# Patient Record
Sex: Male | Born: 1945 | Race: White | Hispanic: No | Smoking: Current every day smoker
Health system: Southern US, Community
[De-identification: ages and names within clinical notes are randomized; demographics above are authoritative.]

## PROBLEM LIST (undated history)

## (undated) DIAGNOSIS — I2510 Atherosclerotic heart disease of native coronary artery without angina pectoris: Secondary | ICD-10-CM

## (undated) DIAGNOSIS — Z9289 Personal history of other medical treatment: Secondary | ICD-10-CM

## (undated) DIAGNOSIS — I4891 Unspecified atrial fibrillation: Secondary | ICD-10-CM

## (undated) DIAGNOSIS — F329 Major depressive disorder, single episode, unspecified: Secondary | ICD-10-CM

## (undated) DIAGNOSIS — J449 Chronic obstructive pulmonary disease, unspecified: Secondary | ICD-10-CM

## (undated) DIAGNOSIS — I10 Essential (primary) hypertension: Secondary | ICD-10-CM

## (undated) DIAGNOSIS — E119 Type 2 diabetes mellitus without complications: Secondary | ICD-10-CM

## (undated) DIAGNOSIS — N189 Chronic kidney disease, unspecified: Secondary | ICD-10-CM

## (undated) DIAGNOSIS — F32A Depression, unspecified: Secondary | ICD-10-CM

## (undated) DIAGNOSIS — I739 Peripheral vascular disease, unspecified: Secondary | ICD-10-CM

## (undated) DIAGNOSIS — K219 Gastro-esophageal reflux disease without esophagitis: Secondary | ICD-10-CM

## (undated) DIAGNOSIS — I639 Cerebral infarction, unspecified: Secondary | ICD-10-CM

## (undated) DIAGNOSIS — Z8719 Personal history of other diseases of the digestive system: Secondary | ICD-10-CM

## (undated) DIAGNOSIS — T8131XA Disruption of external operation (surgical) wound, not elsewhere classified, initial encounter: Secondary | ICD-10-CM

## (undated) DIAGNOSIS — D649 Anemia, unspecified: Secondary | ICD-10-CM

## (undated) DIAGNOSIS — E039 Hypothyroidism, unspecified: Secondary | ICD-10-CM

## (undated) DIAGNOSIS — E785 Hyperlipidemia, unspecified: Secondary | ICD-10-CM

## (undated) HISTORY — DX: Essential (primary) hypertension: I10

## (undated) HISTORY — DX: Anemia, unspecified: D64.9

## (undated) HISTORY — PX: DEBRIDEMENT AND CLOSURE WOUND: SHX5614

## (undated) HISTORY — DX: Disruption of external operation (surgical) wound, not elsewhere classified, initial encounter: T81.31XA

## (undated) HISTORY — DX: Hyperlipidemia, unspecified: E78.5

## (undated) HISTORY — DX: Chronic kidney disease, unspecified: N18.9

## (undated) HISTORY — DX: Peripheral vascular disease, unspecified: I73.9

## (undated) HISTORY — DX: Unspecified atrial fibrillation: I48.91

## (undated) HISTORY — DX: Hypothyroidism, unspecified: E03.9

## (undated) HISTORY — DX: Personal history of other diseases of the digestive system: Z87.19

## (undated) HISTORY — DX: Chronic obstructive pulmonary disease, unspecified: J44.9

## (undated) HISTORY — DX: Atherosclerotic heart disease of native coronary artery without angina pectoris: I25.10

---

## 2000-10-27 HISTORY — PX: CORONARY ARTERY BYPASS GRAFT: SHX141

## 2001-08-27 HISTORY — PX: CARDIAC CATHETERIZATION: SHX172

## 2001-09-07 ENCOUNTER — Encounter: Payer: Self-pay | Admitting: Vascular Surgery

## 2001-09-07 ENCOUNTER — Ambulatory Visit: Admission: RE | Admit: 2001-09-07 | Discharge: 2001-09-07 | Payer: Self-pay | Admitting: Vascular Surgery

## 2001-09-09 ENCOUNTER — Ambulatory Visit (HOSPITAL_COMMUNITY): Admission: RE | Admit: 2001-09-09 | Discharge: 2001-09-09 | Payer: Self-pay | Admitting: Vascular Surgery

## 2001-09-14 ENCOUNTER — Inpatient Hospital Stay (HOSPITAL_COMMUNITY): Admission: AD | Admit: 2001-09-14 | Discharge: 2001-09-22 | Payer: Self-pay | Admitting: Cardiology

## 2001-09-14 ENCOUNTER — Encounter: Payer: Self-pay | Admitting: Cardiology

## 2001-09-15 ENCOUNTER — Encounter: Payer: Self-pay | Admitting: Cardiothoracic Surgery

## 2001-09-16 ENCOUNTER — Encounter: Payer: Self-pay | Admitting: Cardiothoracic Surgery

## 2001-09-17 ENCOUNTER — Encounter: Payer: Self-pay | Admitting: Cardiothoracic Surgery

## 2001-09-18 ENCOUNTER — Encounter: Payer: Self-pay | Admitting: Cardiothoracic Surgery

## 2001-09-19 ENCOUNTER — Encounter: Payer: Self-pay | Admitting: Cardiothoracic Surgery

## 2001-11-29 ENCOUNTER — Encounter: Payer: Self-pay | Admitting: Vascular Surgery

## 2001-12-01 ENCOUNTER — Encounter: Payer: Self-pay | Admitting: Vascular Surgery

## 2001-12-01 ENCOUNTER — Inpatient Hospital Stay (HOSPITAL_COMMUNITY): Admission: RE | Admit: 2001-12-01 | Discharge: 2001-12-03 | Payer: Self-pay | Admitting: Vascular Surgery

## 2001-12-01 HISTORY — PX: FEMORAL BYPASS: SHX50

## 2002-01-17 ENCOUNTER — Encounter: Payer: Self-pay | Admitting: Vascular Surgery

## 2002-01-17 ENCOUNTER — Inpatient Hospital Stay (HOSPITAL_COMMUNITY): Admission: RE | Admit: 2002-01-17 | Discharge: 2002-01-19 | Payer: Self-pay | Admitting: Vascular Surgery

## 2002-01-17 HISTORY — PX: DRAINAGE AND CLOSURE OF LYMPHOCELE: SHX5800

## 2002-12-27 ENCOUNTER — Encounter: Payer: Self-pay | Admitting: Vascular Surgery

## 2002-12-28 ENCOUNTER — Ambulatory Visit (HOSPITAL_COMMUNITY): Admission: RE | Admit: 2002-12-28 | Discharge: 2002-12-28 | Payer: Self-pay | Admitting: Vascular Surgery

## 2002-12-29 ENCOUNTER — Encounter: Payer: Self-pay | Admitting: Vascular Surgery

## 2002-12-29 ENCOUNTER — Inpatient Hospital Stay (HOSPITAL_COMMUNITY): Admission: RE | Admit: 2002-12-29 | Discharge: 2002-12-31 | Payer: Self-pay | Admitting: Vascular Surgery

## 2002-12-29 HISTORY — PX: FEMORAL BYPASS: SHX50

## 2003-01-21 ENCOUNTER — Inpatient Hospital Stay (HOSPITAL_COMMUNITY): Admission: EM | Admit: 2003-01-21 | Discharge: 2003-02-03 | Payer: Self-pay | Admitting: Emergency Medicine

## 2003-01-21 ENCOUNTER — Encounter: Payer: Self-pay | Admitting: Vascular Surgery

## 2003-01-21 HISTORY — PX: INCISION AND DRAINAGE OF WOUND: SHX1803

## 2003-02-01 HISTORY — PX: DEBRIDEMENT AND CLOSURE WOUND: SHX5614

## 2003-05-24 ENCOUNTER — Encounter: Admission: RE | Admit: 2003-05-24 | Discharge: 2003-05-24 | Payer: Self-pay | Admitting: Vascular Surgery

## 2003-05-24 ENCOUNTER — Encounter: Payer: Self-pay | Admitting: Vascular Surgery

## 2003-07-05 ENCOUNTER — Inpatient Hospital Stay (HOSPITAL_COMMUNITY): Admission: AD | Admit: 2003-07-05 | Discharge: 2003-07-07 | Payer: Self-pay | Admitting: Vascular Surgery

## 2003-07-05 ENCOUNTER — Encounter: Payer: Self-pay | Admitting: Vascular Surgery

## 2003-07-06 ENCOUNTER — Encounter: Payer: Self-pay | Admitting: Vascular Surgery

## 2003-07-12 ENCOUNTER — Encounter: Payer: Self-pay | Admitting: Vascular Surgery

## 2003-07-12 ENCOUNTER — Inpatient Hospital Stay (HOSPITAL_COMMUNITY): Admission: RE | Admit: 2003-07-12 | Discharge: 2003-07-19 | Payer: Self-pay | Admitting: Vascular Surgery

## 2003-07-12 ENCOUNTER — Encounter (INDEPENDENT_AMBULATORY_CARE_PROVIDER_SITE_OTHER): Payer: Self-pay | Admitting: Specialist

## 2003-07-12 HISTORY — PX: FEMORAL ENDARTERECTOMY: SUR606

## 2004-04-16 ENCOUNTER — Ambulatory Visit (HOSPITAL_COMMUNITY): Admission: RE | Admit: 2004-04-16 | Discharge: 2004-04-16 | Payer: Self-pay | Admitting: Vascular Surgery

## 2005-01-24 ENCOUNTER — Inpatient Hospital Stay (HOSPITAL_COMMUNITY): Admission: AD | Admit: 2005-01-24 | Discharge: 2005-01-31 | Payer: Self-pay | Admitting: Vascular Surgery

## 2005-01-29 HISTORY — PX: FEMOROPOPLITEAL THROMBECTOMY / EMBOLECTOMY: SUR432

## 2005-03-05 ENCOUNTER — Inpatient Hospital Stay (HOSPITAL_COMMUNITY): Admission: AD | Admit: 2005-03-05 | Discharge: 2005-03-10 | Payer: Self-pay | Admitting: Sports Medicine

## 2005-03-14 ENCOUNTER — Inpatient Hospital Stay (HOSPITAL_COMMUNITY): Admission: AD | Admit: 2005-03-14 | Discharge: 2005-03-20 | Payer: Self-pay | Admitting: Vascular Surgery

## 2005-03-17 ENCOUNTER — Encounter (INDEPENDENT_AMBULATORY_CARE_PROVIDER_SITE_OTHER): Payer: Self-pay | Admitting: *Deleted

## 2005-08-25 ENCOUNTER — Encounter: Admission: RE | Admit: 2005-08-25 | Discharge: 2005-08-25 | Payer: Self-pay | Admitting: Vascular Surgery

## 2005-09-03 ENCOUNTER — Inpatient Hospital Stay (HOSPITAL_COMMUNITY): Admission: RE | Admit: 2005-09-03 | Discharge: 2005-09-09 | Payer: Self-pay | Admitting: Vascular Surgery

## 2005-09-09 HISTORY — PX: FEMORAL BYPASS: SHX50

## 2006-02-06 ENCOUNTER — Inpatient Hospital Stay (HOSPITAL_COMMUNITY): Admission: AD | Admit: 2006-02-06 | Discharge: 2006-03-03 | Payer: Self-pay | Admitting: Vascular Surgery

## 2006-02-06 HISTORY — PX: AXILLARY-FEMORAL BYPASS GRAFT: SHX894

## 2006-02-06 HISTORY — PX: FEMOROPOPLITEAL THROMBECTOMY / EMBOLECTOMY: SUR432

## 2006-02-07 ENCOUNTER — Encounter: Payer: Self-pay | Admitting: Vascular Surgery

## 2006-02-07 ENCOUNTER — Encounter (INDEPENDENT_AMBULATORY_CARE_PROVIDER_SITE_OTHER): Payer: Self-pay | Admitting: *Deleted

## 2006-02-08 HISTORY — PX: THROMBECTOMY / EMBOLECTOMY AXILLARY ARTERY: SUR1352

## 2006-02-09 ENCOUNTER — Encounter: Payer: Self-pay | Admitting: Vascular Surgery

## 2006-02-12 HISTORY — PX: DEBRIDEMENT  FOOT: SUR387

## 2006-02-23 ENCOUNTER — Encounter (INDEPENDENT_AMBULATORY_CARE_PROVIDER_SITE_OTHER): Payer: Self-pay | Admitting: *Deleted

## 2006-02-23 HISTORY — PX: BELOW KNEE LEG AMPUTATION: SUR23

## 2006-02-24 ENCOUNTER — Ambulatory Visit: Payer: Self-pay | Admitting: Physical Medicine & Rehabilitation

## 2006-03-20 ENCOUNTER — Ambulatory Visit (HOSPITAL_COMMUNITY): Admission: RE | Admit: 2006-03-20 | Discharge: 2006-03-20 | Payer: Self-pay | Admitting: Vascular Surgery

## 2006-03-27 ENCOUNTER — Inpatient Hospital Stay (HOSPITAL_COMMUNITY): Admission: AD | Admit: 2006-03-27 | Discharge: 2006-04-23 | Payer: Self-pay | Admitting: Vascular Surgery

## 2006-03-30 HISTORY — PX: WOUND DEBRIDEMENT: SHX247

## 2006-03-31 ENCOUNTER — Ambulatory Visit: Payer: Self-pay | Admitting: Physical Medicine & Rehabilitation

## 2006-04-26 ENCOUNTER — Ambulatory Visit (HOSPITAL_COMMUNITY): Admission: EM | Admit: 2006-04-26 | Discharge: 2006-04-26 | Payer: Self-pay | Admitting: Pediatrics

## 2006-09-02 ENCOUNTER — Encounter: Admission: RE | Admit: 2006-09-02 | Discharge: 2006-12-01 | Payer: Self-pay | Admitting: Vascular Surgery

## 2007-01-07 ENCOUNTER — Ambulatory Visit: Payer: Self-pay | Admitting: *Deleted

## 2007-06-09 ENCOUNTER — Ambulatory Visit: Payer: Self-pay | Admitting: Vascular Surgery

## 2007-07-07 ENCOUNTER — Ambulatory Visit: Payer: Self-pay | Admitting: Vascular Surgery

## 2007-08-04 ENCOUNTER — Ambulatory Visit: Payer: Self-pay | Admitting: Vascular Surgery

## 2007-11-10 ENCOUNTER — Ambulatory Visit: Payer: Self-pay | Admitting: Vascular Surgery

## 2007-12-15 ENCOUNTER — Ambulatory Visit: Payer: Self-pay | Admitting: Vascular Surgery

## 2007-12-29 ENCOUNTER — Ambulatory Visit: Payer: Self-pay | Admitting: Vascular Surgery

## 2008-01-05 ENCOUNTER — Encounter: Admission: RE | Admit: 2008-01-05 | Discharge: 2008-01-05 | Payer: Self-pay | Admitting: Vascular Surgery

## 2008-01-05 ENCOUNTER — Ambulatory Visit: Payer: Self-pay | Admitting: Vascular Surgery

## 2008-01-14 ENCOUNTER — Ambulatory Visit: Payer: Self-pay | Admitting: Vascular Surgery

## 2008-01-14 ENCOUNTER — Ambulatory Visit (HOSPITAL_COMMUNITY): Admission: RE | Admit: 2008-01-14 | Discharge: 2008-01-14 | Payer: Self-pay | Admitting: Vascular Surgery

## 2008-04-19 ENCOUNTER — Ambulatory Visit: Payer: Self-pay | Admitting: Vascular Surgery

## 2008-07-26 ENCOUNTER — Ambulatory Visit: Payer: Self-pay | Admitting: Vascular Surgery

## 2008-11-08 ENCOUNTER — Ambulatory Visit: Payer: Self-pay | Admitting: Vascular Surgery

## 2008-11-23 ENCOUNTER — Ambulatory Visit (HOSPITAL_COMMUNITY): Admission: RE | Admit: 2008-11-23 | Discharge: 2008-11-23 | Payer: Self-pay | Admitting: Endocrinology

## 2009-05-30 ENCOUNTER — Inpatient Hospital Stay (HOSPITAL_COMMUNITY): Admission: AD | Admit: 2009-05-30 | Discharge: 2009-06-09 | Payer: Self-pay | Admitting: Pulmonary Disease

## 2009-05-30 ENCOUNTER — Encounter: Payer: Self-pay | Admitting: Emergency Medicine

## 2009-05-30 ENCOUNTER — Ambulatory Visit: Payer: Self-pay | Admitting: Pulmonary Disease

## 2009-05-31 ENCOUNTER — Ambulatory Visit: Payer: Self-pay | Admitting: Vascular Surgery

## 2009-05-31 ENCOUNTER — Ambulatory Visit: Payer: Self-pay | Admitting: Internal Medicine

## 2009-05-31 ENCOUNTER — Encounter: Payer: Self-pay | Admitting: Internal Medicine

## 2009-06-04 ENCOUNTER — Encounter: Payer: Self-pay | Admitting: Internal Medicine

## 2009-06-05 ENCOUNTER — Encounter: Payer: Self-pay | Admitting: Surgery

## 2009-06-05 HISTORY — PX: ABOVE KNEE LEG AMPUTATION: SUR20

## 2009-06-08 ENCOUNTER — Encounter: Payer: Self-pay | Admitting: Infectious Diseases

## 2009-06-25 ENCOUNTER — Ambulatory Visit: Payer: Self-pay | Admitting: Surgery

## 2009-07-09 ENCOUNTER — Ambulatory Visit: Payer: Self-pay | Admitting: Surgery

## 2009-10-31 ENCOUNTER — Ambulatory Visit: Payer: Self-pay | Admitting: Vascular Surgery

## 2009-10-31 ENCOUNTER — Encounter: Payer: Self-pay | Admitting: Infectious Diseases

## 2009-10-31 ENCOUNTER — Encounter: Admission: RE | Admit: 2009-10-31 | Discharge: 2009-10-31 | Payer: Self-pay | Admitting: Vascular Surgery

## 2009-11-14 ENCOUNTER — Encounter: Payer: Self-pay | Admitting: Infectious Diseases

## 2009-11-21 ENCOUNTER — Ambulatory Visit: Payer: Self-pay | Admitting: Vascular Surgery

## 2009-11-21 DIAGNOSIS — M8618 Other acute osteomyelitis, other site: Secondary | ICD-10-CM

## 2009-11-26 ENCOUNTER — Encounter (INDEPENDENT_AMBULATORY_CARE_PROVIDER_SITE_OTHER): Payer: Self-pay | Admitting: *Deleted

## 2009-11-26 DIAGNOSIS — I10 Essential (primary) hypertension: Secondary | ICD-10-CM | POA: Insufficient documentation

## 2009-11-26 DIAGNOSIS — I2510 Atherosclerotic heart disease of native coronary artery without angina pectoris: Secondary | ICD-10-CM | POA: Insufficient documentation

## 2009-11-26 DIAGNOSIS — E119 Type 2 diabetes mellitus without complications: Secondary | ICD-10-CM

## 2009-11-27 ENCOUNTER — Ambulatory Visit: Payer: Self-pay | Admitting: Infectious Diseases

## 2009-11-27 LAB — CONVERTED CEMR LAB
BUN: 72 mg/dL — ABNORMAL HIGH (ref 6–23)
Calcium: 9.1 mg/dL (ref 8.4–10.5)
Creatinine, Ser: 2.18 mg/dL — ABNORMAL HIGH (ref 0.40–1.50)
Glucose, Bld: 307 mg/dL — ABNORMAL HIGH (ref 70–99)
Sodium: 136 meq/L (ref 135–145)

## 2009-11-30 ENCOUNTER — Encounter: Payer: Self-pay | Admitting: Infectious Diseases

## 2009-12-04 ENCOUNTER — Telehealth: Payer: Self-pay

## 2009-12-10 ENCOUNTER — Ambulatory Visit: Payer: Self-pay | Admitting: Infectious Diseases

## 2009-12-10 LAB — CONVERTED CEMR LAB
Basophils Absolute: 0 10*3/uL (ref 0.0–0.1)
Basophils Relative: 0 % (ref 0–1)
CRP: 0.9 mg/dL — ABNORMAL HIGH (ref <0.6)
Eosinophils Absolute: 0.2 10*3/uL (ref 0.0–0.7)
Eosinophils Relative: 2 % (ref 0–5)
Hemoglobin: 14.3 g/dL (ref 13.0–17.0)
Lymphs Abs: 2.5 10*3/uL (ref 0.7–4.0)
MCV: 79.7 fL (ref >=78.0)
Monocytes Absolute: 0.9 10*3/uL (ref 0.1–1.0)
Neutrophils Relative %: 64 % (ref 43–77)
RBC: 5.41 M/uL (ref 4.22–5.81)
Sed Rate: 25 mm/hr — ABNORMAL HIGH (ref 0–16)

## 2010-04-05 ENCOUNTER — Ambulatory Visit: Payer: Self-pay | Admitting: Vascular Surgery

## 2010-04-11 ENCOUNTER — Inpatient Hospital Stay (HOSPITAL_COMMUNITY): Admission: RE | Admit: 2010-04-11 | Discharge: 2010-04-11 | Payer: Self-pay | Admitting: Vascular Surgery

## 2010-04-11 HISTORY — PX: LEG AMPUTATION ABOVE KNEE: SHX117

## 2010-05-15 ENCOUNTER — Ambulatory Visit: Payer: Self-pay | Admitting: Vascular Surgery

## 2010-06-13 ENCOUNTER — Ambulatory Visit: Payer: Self-pay | Admitting: Vascular Surgery

## 2010-06-27 ENCOUNTER — Ambulatory Visit: Payer: Self-pay | Admitting: Vascular Surgery

## 2010-07-03 IMAGING — CR DG CHEST 1V PORT
1 series · 1 of 1 positions shown · non-contrast
Comparison: Chest radiograph performed 01/14/2008.

CLINICAL DATA: Intermittent shortness of breath for 1 day;
weakness.  History of smoking.

PORTABLE CHEST - 1 VIEW

[view not recorded]
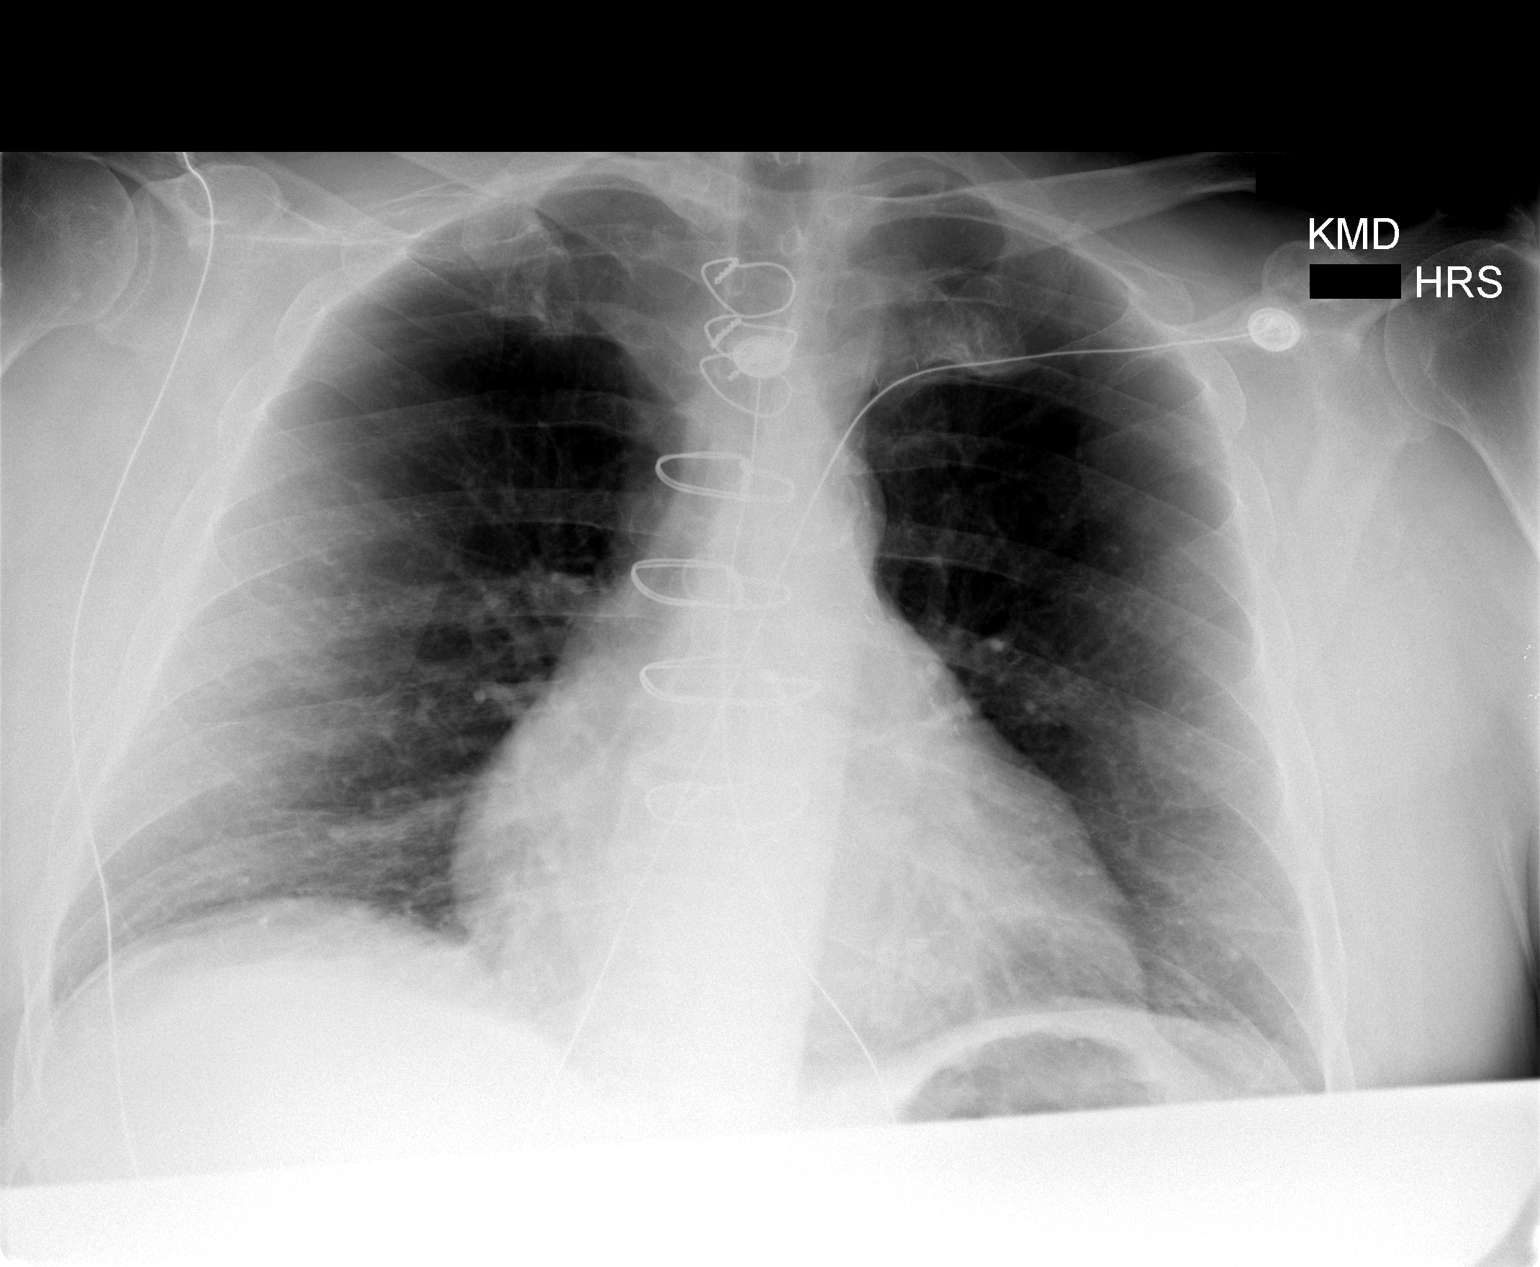

[1 of 1 positions shown; findings below may reference images not displayed]

FINDINGS: The lungs are mildly hypoexpanded; minimal bibasilar
atelectasis is noted.  No additional focal consolidation is seen.
There is no evidence of pleural effusion or pneumothorax, although
the left costophrenic angle is incompletely imaged on this study.

The cardiomediastinal silhouette is mildly prominent, but appears
generally stable from the prior study, given technique.  The
patient is status post median sternotomy.  Post-operative changes
relating to prior CABG are noted.  Calcification is seen within the
aortic arch.  No acute osseous abnormalities are identified.
IMPRESSION: 1.  Mildly hypoexpanded lungs with minimal bibasilar atelectasis.
2.  Mild stable cardiomegaly.

## 2010-07-08 IMAGING — CR DG CHEST 1V PORT
1 series · 1 of 1 positions shown · non-contrast
Comparison: 06/01/2009

CLINICAL DATA: Septic shock

PORTABLE CHEST - 1 VIEW

[view not recorded]
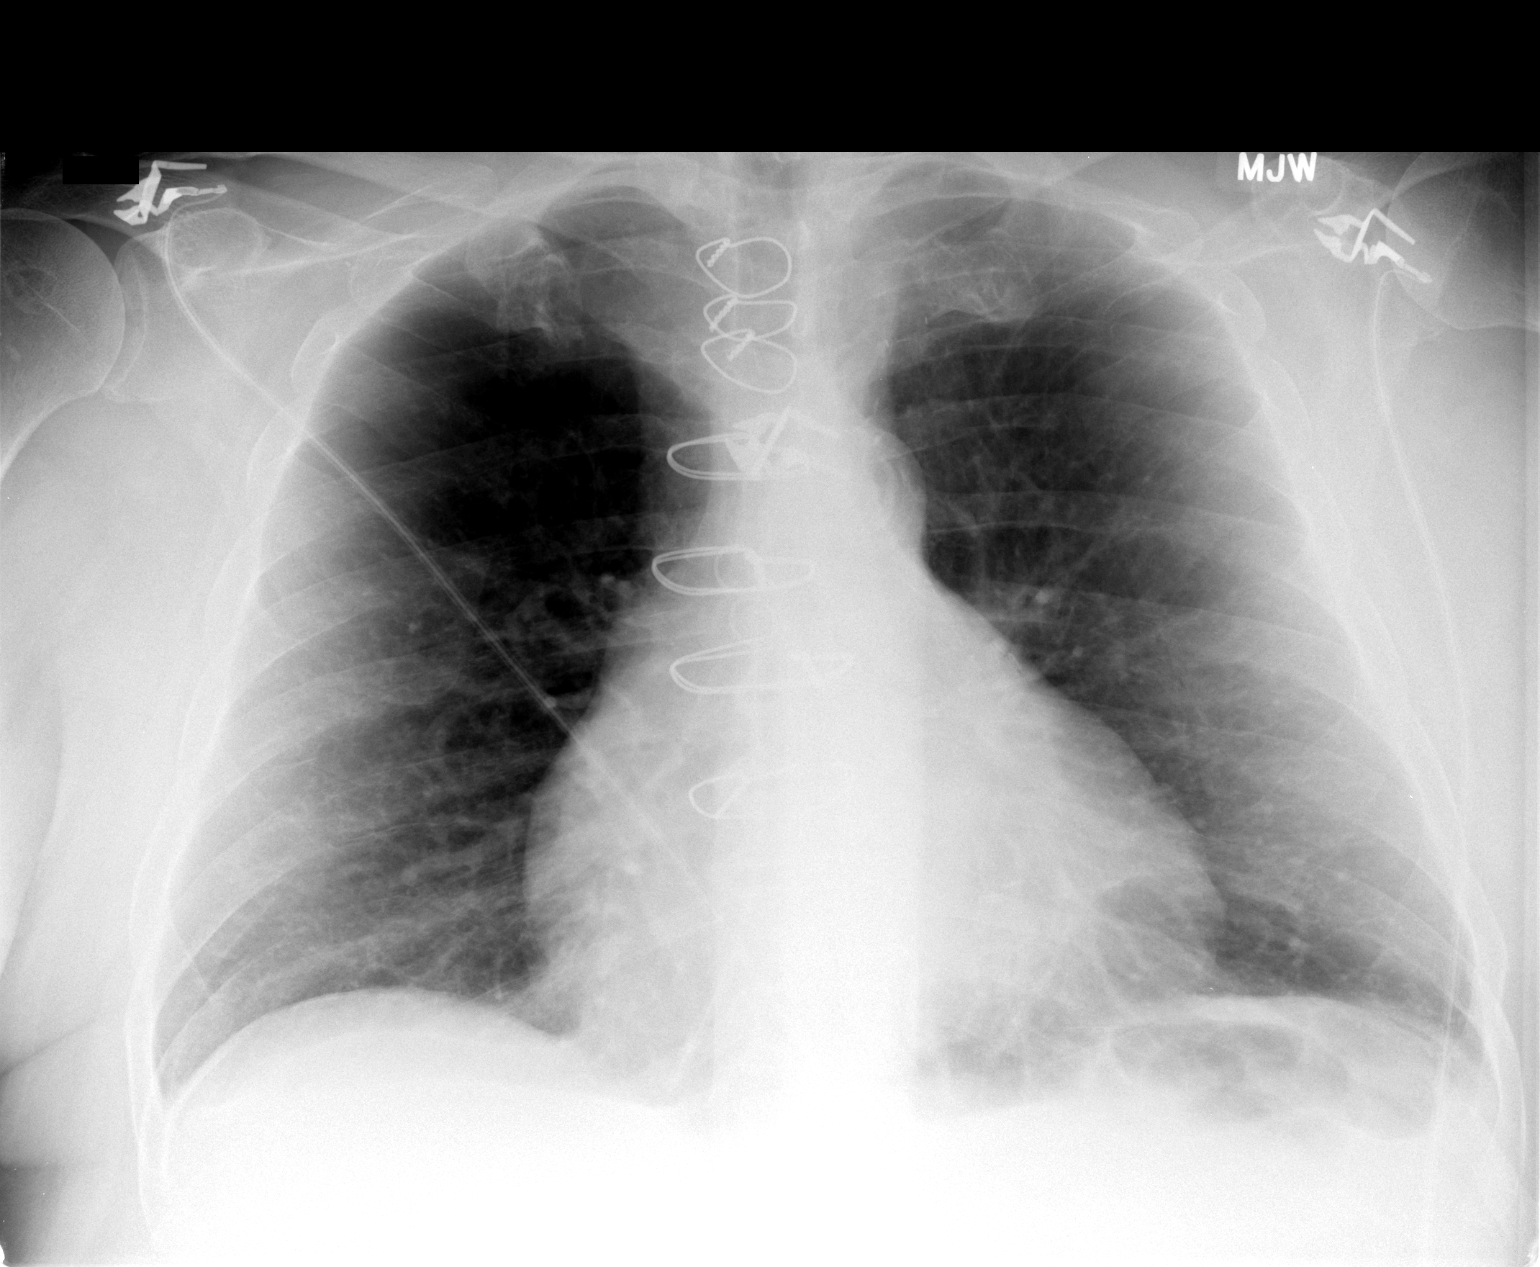

[1 of 1 positions shown; findings below may reference images not displayed]

FINDINGS: Interval improvement in aeration of the right lower lobe.
In one-view, the lungs appear clear. No pleural fluid. Heart size
upper normal considering AP projection.  Prior CABG.
IMPRESSION: Right lower lobe density resolved - currently the lungs are clear
in one-view.

## 2010-07-11 ENCOUNTER — Ambulatory Visit: Payer: Self-pay | Admitting: Vascular Surgery

## 2010-11-07 ENCOUNTER — Ambulatory Visit: Admit: 2010-11-07 | Payer: Self-pay | Admitting: Surgery

## 2010-11-17 ENCOUNTER — Encounter: Payer: Self-pay | Admitting: Vascular Surgery

## 2010-11-26 NOTE — Medication Information (Signed)
Summary: Medicare Plans: RX  Medicare Plans: RX   Imported By: Florinda Marker 12/27/2009 16:51:35  _____________________________________________________________________  External Attachment:    Type:   Image     Comment:   External Document

## 2010-11-26 NOTE — Miscellaneous (Signed)
Summary: Problems, Medications and Allergies  Clinical Lists Changes  Problems: Added new problem of ACUTE OSTEOMYELITIS OTHER SPECIFIED SITE (ICD-730.08) - right femur Added new problem of CAD (ICD-414.00) Added new problem of DM (ICD-250.00) Added new problem of HYPERLIPIDEMIA (ICD-272.4) Added new problem of HYPERTENSION (ICD-401.9) Medications: Added new medication of JANUVIA 100 MG TABS (SITAGLIPTIN PHOSPHATE) Take 1 tablet by mouth once a day per PCP Added new medication of * VITAMIN D3 UNIT CAPS (CHOLECALCIFEROL) Take 1 capsule by mouth once a day per PCP Added new medication of ATENOLOL 25 MG TABS (ATENOLOL) Take 1 tablet by mouth at bedtime per PCP Added new medication of LISINOPRIL-HYDROCHLOROTHIAZIDE 20-12.5 MG TABS (LISINOPRIL-HYDROCHLOROTHIAZIDE) Take 1 tablet by mouth once a day per PCP Added new medication of SIMVASTATIN 40 MG TABS (SIMVASTATIN) Take 1 tablet by mouth once a day per PCP Added new medication of LANTUS 100 UNIT/ML SOLN (INSULIN GLARGINE) Inject subcutaneously 50 units at bedtime per PCP Added new medication of GLIMEPIRIDE 2 MG TABS (GLIMEPIRIDE) Take 1 tablet by mouth once a day in the AM per PCP Added new medication of GLIMEPIRIDE 4 MG TABS (GLIMEPIRIDE) Take 1 tablet by mouth once a day in the AM per PCP Added new medication of CARDIZEM LA 180 MG XR24H-TAB (DILTIAZEM HCL COATED BEADS) Take 1 tablet by mouth once a day per PCP Added new medication of OMEPRAZOLE 40 MG CPDR (OMEPRAZOLE) Take 1 capsule by mouth once a day per PCP Observations: Added new observation of NKA: T (11/26/2009 9:43)

## 2010-11-26 NOTE — Progress Notes (Signed)
Summary: needs bloodwork  Phone Note Other Incoming   Summary of Call: [name] needs to get and esr and crp done given that he never got them done from Dr Darrick Penna office.  Please ask him to come for BW at his convenience in next 1 wk Initial call taken by: Clydie Braun MD,  December 04, 2009 12:03 PM  Follow-up for Phone Call        Pt informed and will come on Monday 12-10-09. Tomasita Morrow RN  December 07, 2009 3:12 PM

## 2010-11-26 NOTE — Miscellaneous (Signed)
Summary: HIPAA Restrictions  HIPAA Restrictions   Imported By: Florinda Marker 11/27/2009 15:50:01  _____________________________________________________________________  External Attachment:    Type:   Image     Comment:   External Document

## 2010-11-26 NOTE — Consult Note (Signed)
Summary: [facility]  [facility]   Imported By: Florinda Marker 12/06/2009 13:38:51  _____________________________________________________________________  External Attachment:    Type:   Image     Comment:   External Document

## 2010-11-26 NOTE — Assessment & Plan Note (Signed)
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  Has history of prior MSSA infxn of L leg wound and mixed cx reults from his R stump infn in 2007.  Since his AKA he has had diff wiht pain at stump when wearing prosthesis.    During prior BKA infxn he had received 8 wks of IV vanco. Has been on doxycyline suppressive therapy for suppression for over a year.   r aka 8/10    Current Allergies: No known allergies  Past History:  Past Medical History: 1. Peripheral vascular disease.   2. Atrial fibrillation.   3. Coronary artery disease, status post bypass.   4. Diabetes mellitus type 2.   5. Hypothyroidism.   6. Hyperlipidemia.   7. Hypertension.   8. History of acute on chronic renal disease.    Past Surgical History: 1. Left femoral bypass/revision in 2003/2006.   2. Right BKA 2007.  - complicated by ulcer 3. R AKA 8/10  4. Coronary artery bypass graft.   Vital Signs:  Patient profile:   65 year old male Height:      71 inches Weight:      182.0 pounds Temp:     96.7 degrees F oral  Physical Exam  Additional Exam:  March 27 2009 r stump cx  FEW WBC PRESENT, PREDOMINANTLY PMN                                ABUNDANT                                GRAM                                NEGATIVE RODS                                FEW GRAM POSITIVE COCCI                                IN PAIRS                              CULTURE:                      MULTIPLE ORGANISMS PRESENT, NONE PREDOMINANT                                Note:                                NO STAPHYLOCOCCUS AUREUS ISOLATED                                NO GROUP A STREP (S.PYOGENES) ISOLATED  xray jan 5  Findings:   There are small foci of cortical loss and   demineralization of the distal right femoral stump  worrisome for   osteomyelitis.  The bone is diffusely osteopenic. Considerable  arterial calcification is noted.    IMPRESSION:   There are foci worrisome for osteomyelitis involving the stump of   the distal right femur status post AKA.    Impression & Recommendations:  Problem # 1:  ACUTE OSTEOMYELITIS OTHER SPECIFIED SITE (ICD-730.08) I am not convinced that he has osteo at the stump but could be.  Will check esr and cbc. check mri to eval for osteo If positive will likely need 6 wks therapy  Orders: T-Basic Metabolic Panel [id number]) MRI with & without Contrast (MRI w&w/o Contrast)  Problem # 2:  DM (ICD-250.00)  His updated medication list for this problem includes:    Januvia 100 Mg Tabs (Sitagliptin phosphate) .[name]... Take 1 tablet by mouth once a day per pcp    Lisinopril-hydrochlorothiazide 20-12.5 Mg Tabs (Lisinopril-hydrochlorothiazide) .[name]... Take 1 tablet by mouth once a day per pcp    Lantus 100 Unit/ml Soln (Insulin glargine) ..... Inject subcutaneously 50 units at bedtime per pcp    Glimepiride 2 Mg Tabs (Glimepiride) .[name]... Take 1 tablet by mouth once a day in the am per pcp    Glimepiride 4 Mg Tabs (Glimepiride) .[name]... Take 1 tablet by mouth once a day in the am per pcp  Patient Instructions: 1)  Will check MRI - we will call you to schedule. 2)  Please schedule a follow-up appointment in 1 month. Process Orders Check Orders Results:     [facility]: Check successful Tests Sent for requisitioning (December 03, 2009 9:33 PM):     11/27/2009: [facility] -- T-Basic Metabolic Panel [phone] (signed)

## 2010-11-26 NOTE — Consult Note (Signed)
Summary: Office Visit Notes  Office Visit Notes   Imported By: Florinda Marker 12/06/2009 13:41:12  _____________________________________________________________________  External Attachment:    Type:   Image     Comment:   External Document

## 2011-01-12 LAB — POCT I-STAT 4, (NA,K, GLUC, HGB,HCT)
Glucose, Bld: 210 mg/dL — ABNORMAL HIGH (ref 70–99)
Hemoglobin: 17.3 g/dL — ABNORMAL HIGH (ref 13.0–17.0)
Potassium: 4.3 mEq/L (ref 3.5–5.1)
Sodium: 137 mEq/L (ref 135–145)

## 2011-01-12 LAB — GLUCOSE, CAPILLARY
Glucose-Capillary: 226 mg/dL — ABNORMAL HIGH (ref 70–99)
Glucose-Capillary: 284 mg/dL — ABNORMAL HIGH (ref 70–99)

## 2011-01-12 LAB — PROTIME-INR
INR: 1.03 (ref 0.00–1.49)
Prothrombin Time: 13.4 seconds (ref 11.6–15.2)

## 2011-01-12 LAB — APTT: aPTT: 28 seconds (ref 24–37)

## 2011-02-01 LAB — BASIC METABOLIC PANEL
BUN: 11 mg/dL (ref 6–23)
BUN: 23 mg/dL (ref 6–23)
CO2: 19 mEq/L (ref 19–32)
CO2: 19 mEq/L (ref 19–32)
CO2: 19 mEq/L (ref 19–32)
CO2: 20 mEq/L (ref 19–32)
CO2: 24 mEq/L (ref 19–32)
Calcium: 7.4 mg/dL — ABNORMAL LOW (ref 8.4–10.5)
Calcium: 7.6 mg/dL — ABNORMAL LOW (ref 8.4–10.5)
Calcium: 9.7 mg/dL (ref 8.4–10.5)
Chloride: 107 mEq/L (ref 96–112)
Chloride: 108 mEq/L (ref 96–112)
Chloride: 109 mEq/L (ref 96–112)
Chloride: 111 mEq/L (ref 96–112)
Chloride: 99 mEq/L (ref 96–112)
Creatinine, Ser: 1.27 mg/dL (ref 0.4–1.5)
Creatinine, Ser: 1.39 mg/dL (ref 0.4–1.5)
Creatinine, Ser: 1.77 mg/dL — ABNORMAL HIGH (ref 0.4–1.5)
Creatinine, Ser: 3.68 mg/dL — ABNORMAL HIGH (ref 0.4–1.5)
GFR calc Af Amer: 14 mL/min — ABNORMAL LOW (ref >60)
GFR calc Af Amer: 20 mL/min — ABNORMAL LOW (ref >60)
GFR calc Af Amer: 47 mL/min — ABNORMAL LOW (ref >60)
GFR calc Af Amer: 50 mL/min — ABNORMAL LOW (ref >60)
GFR calc Af Amer: >60 mL/min (ref >60)
GFR calc Af Amer: >60 mL/min (ref >60)
GFR calc non Af Amer: 33 mL/min — ABNORMAL LOW (ref >60)
GFR calc non Af Amer: 57 mL/min — ABNORMAL LOW (ref >60)
Glucose, Bld: 123 mg/dL — ABNORMAL HIGH (ref 70–99)
Potassium: 3.9 mEq/L (ref 3.5–5.1)
Potassium: 3.9 mEq/L (ref 3.5–5.1)
Potassium: 4 mEq/L (ref 3.5–5.1)
Potassium: 4.3 mEq/L (ref 3.5–5.1)
Sodium: 135 mEq/L (ref 135–145)
Sodium: 136 mEq/L (ref 135–145)
Sodium: 137 mEq/L (ref 135–145)
Sodium: 137 mEq/L (ref 135–145)
Sodium: 139 mEq/L (ref 135–145)
Sodium: 141 mEq/L (ref 135–145)

## 2011-02-01 LAB — CBC
HCT: 25.7 % — ABNORMAL LOW (ref 39.0–52.0)
HCT: 26.4 % — ABNORMAL LOW (ref 39.0–52.0)
HCT: 28.5 % — ABNORMAL LOW (ref 39.0–52.0)
HCT: 29.5 % — ABNORMAL LOW (ref 39.0–52.0)
Hemoglobin: 10.1 g/dL — ABNORMAL LOW (ref 13.0–17.0)
Hemoglobin: 8.4 g/dL — ABNORMAL LOW (ref 13.0–17.0)
Hemoglobin: 8.8 g/dL — ABNORMAL LOW (ref 13.0–17.0)
Hemoglobin: 8.8 g/dL — ABNORMAL LOW (ref 13.0–17.0)
Hemoglobin: 8.9 g/dL — ABNORMAL LOW (ref 13.0–17.0)
Hemoglobin: 9.6 g/dL — ABNORMAL LOW (ref 13.0–17.0)
MCHC: 32.8 g/dL (ref 30.0–36.0)
MCHC: 33 g/dL (ref 30.0–36.0)
MCHC: 33 g/dL (ref 30.0–36.0)
MCHC: 33.8 g/dL (ref 30.0–36.0)
MCHC: 34 g/dL (ref 30.0–36.0)
MCV: 81.2 fL (ref 78.0–100.0)
MCV: 81.8 fL (ref 78.0–100.0)
MCV: 81.8 fL (ref 78.0–100.0)
MCV: 82.7 fL (ref 78.0–100.0)
MCV: 82.9 fL (ref 78.0–100.0)
Platelets: 170 10*3/uL (ref 150–400)
Platelets: 171 10*3/uL (ref 150–400)
Platelets: 174 10*3/uL (ref 150–400)
Platelets: 235 10*3/uL (ref 150–400)
Platelets: 250 10*3/uL (ref 150–400)
Platelets: 259 10*3/uL (ref 150–400)
Platelets: 277 10*3/uL (ref 150–400)
RBC: 3.06 MIL/uL — ABNORMAL LOW (ref 4.22–5.81)
RBC: 3.15 MIL/uL — ABNORMAL LOW (ref 4.22–5.81)
RBC: 3.16 MIL/uL — ABNORMAL LOW (ref 4.22–5.81)
RBC: 3.26 MIL/uL — ABNORMAL LOW (ref 4.22–5.81)
RBC: 3.44 MIL/uL — ABNORMAL LOW (ref 4.22–5.81)
RBC: 3.7 MIL/uL — ABNORMAL LOW (ref 4.22–5.81)
RDW: 15.9 % — ABNORMAL HIGH (ref 11.5–15.5)
RDW: 15.9 % — ABNORMAL HIGH (ref 11.5–15.5)
RDW: 16.2 % — ABNORMAL HIGH (ref 11.5–15.5)
RDW: 16.3 % — ABNORMAL HIGH (ref 11.5–15.5)
RDW: 16.5 % — ABNORMAL HIGH (ref 11.5–15.5)
WBC: 10 10*3/uL (ref 4.0–10.5)
WBC: 10.1 10*3/uL (ref 4.0–10.5)
WBC: 11.7 10*3/uL — ABNORMAL HIGH (ref 4.0–10.5)
WBC: 11.7 10*3/uL — ABNORMAL HIGH (ref 4.0–10.5)
WBC: 20.3 10*3/uL — ABNORMAL HIGH (ref 4.0–10.5)
WBC: 9.4 10*3/uL (ref 4.0–10.5)
WBC: 9.8 10*3/uL (ref 4.0–10.5)

## 2011-02-01 LAB — GLUCOSE, CAPILLARY
Glucose-Capillary: 121 mg/dL — ABNORMAL HIGH (ref 70–99)
Glucose-Capillary: 121 mg/dL — ABNORMAL HIGH (ref 70–99)
Glucose-Capillary: 123 mg/dL — ABNORMAL HIGH (ref 70–99)
Glucose-Capillary: 123 mg/dL — ABNORMAL HIGH (ref 70–99)
Glucose-Capillary: 128 mg/dL — ABNORMAL HIGH (ref 70–99)
Glucose-Capillary: 140 mg/dL — ABNORMAL HIGH (ref 70–99)
Glucose-Capillary: 142 mg/dL — ABNORMAL HIGH (ref 70–99)
Glucose-Capillary: 143 mg/dL — ABNORMAL HIGH (ref 70–99)
Glucose-Capillary: 144 mg/dL — ABNORMAL HIGH (ref 70–99)
Glucose-Capillary: 151 mg/dL — ABNORMAL HIGH (ref 70–99)
Glucose-Capillary: 154 mg/dL — ABNORMAL HIGH (ref 70–99)
Glucose-Capillary: 165 mg/dL — ABNORMAL HIGH (ref 70–99)
Glucose-Capillary: 168 mg/dL — ABNORMAL HIGH (ref 70–99)
Glucose-Capillary: 170 mg/dL — ABNORMAL HIGH (ref 70–99)
Glucose-Capillary: 170 mg/dL — ABNORMAL HIGH (ref 70–99)
Glucose-Capillary: 175 mg/dL — ABNORMAL HIGH (ref 70–99)
Glucose-Capillary: 175 mg/dL — ABNORMAL HIGH (ref 70–99)
Glucose-Capillary: 188 mg/dL — ABNORMAL HIGH (ref 70–99)
Glucose-Capillary: 189 mg/dL — ABNORMAL HIGH (ref 70–99)
Glucose-Capillary: 192 mg/dL — ABNORMAL HIGH (ref 70–99)
Glucose-Capillary: 201 mg/dL — ABNORMAL HIGH (ref 70–99)
Glucose-Capillary: 230 mg/dL — ABNORMAL HIGH (ref 70–99)
Glucose-Capillary: 242 mg/dL — ABNORMAL HIGH (ref 70–99)
Glucose-Capillary: 334 mg/dL — ABNORMAL HIGH (ref 70–99)
Glucose-Capillary: 76 mg/dL (ref 70–99)
Glucose-Capillary: 83 mg/dL (ref 70–99)
Glucose-Capillary: 86 mg/dL (ref 70–99)
Glucose-Capillary: 90 mg/dL (ref 70–99)

## 2011-02-01 LAB — COMPREHENSIVE METABOLIC PANEL
ALT: 18 U/L (ref 0–53)
ALT: 19 U/L (ref 0–53)
AST: 25 U/L (ref 0–37)
AST: 29 U/L (ref 0–37)
Albumin: 2.5 g/dL — ABNORMAL LOW (ref 3.5–5.2)
Albumin: 3.1 g/dL — ABNORMAL LOW (ref 3.5–5.2)
Alkaline Phosphatase: 28 U/L — ABNORMAL LOW (ref 39–117)
Alkaline Phosphatase: 34 U/L — ABNORMAL LOW (ref 39–117)
BUN: 124 mg/dL — ABNORMAL HIGH (ref 6–23)
Calcium: 9.4 mg/dL (ref 8.4–10.5)
Chloride: 110 mEq/L (ref 96–112)
Chloride: 92 mEq/L — ABNORMAL LOW (ref 96–112)
GFR calc Af Amer: 13 mL/min — ABNORMAL LOW (ref >60)
Glucose, Bld: 162 mg/dL — ABNORMAL HIGH (ref 70–99)
Potassium: 3.7 mEq/L (ref 3.5–5.1)
Potassium: 5 mEq/L (ref 3.5–5.1)
Sodium: 133 mEq/L — ABNORMAL LOW (ref 135–145)
Sodium: 138 mEq/L (ref 135–145)
Total Bilirubin: 1 mg/dL (ref 0.3–1.2)
Total Bilirubin: 1.2 mg/dL (ref 0.3–1.2)
Total Protein: 5.3 g/dL — ABNORMAL LOW (ref 6.0–8.3)
Total Protein: 6 g/dL (ref 6.0–8.3)

## 2011-02-01 LAB — DIGOXIN LEVEL: Digoxin Level: 0.3 ng/mL — ABNORMAL LOW (ref 0.8–2.0)

## 2011-02-01 LAB — POCT I-STAT, CHEM 8
Calcium, Ion: 1.37 mmol/L — ABNORMAL HIGH (ref 1.12–1.32)
HCT: 30 % — ABNORMAL LOW (ref 39.0–52.0)
TCO2: 28 mmol/L (ref 0–100)

## 2011-02-01 LAB — RENAL FUNCTION PANEL
Calcium: 8.8 mg/dL (ref 8.4–10.5)
GFR calc Af Amer: 30 mL/min — ABNORMAL LOW (ref >60)
GFR calc non Af Amer: 25 mL/min — ABNORMAL LOW (ref >60)
Phosphorus: 2.5 mg/dL (ref 2.3–4.6)
Sodium: 137 mEq/L (ref 135–145)

## 2011-02-01 LAB — CROSSMATCH
ABO/RH(D): A POS
Antibody Screen: NEGATIVE

## 2011-02-01 LAB — URINE MICROSCOPIC-ADD ON

## 2011-02-01 LAB — PREPARE FRESH FROZEN PLASMA

## 2011-02-01 LAB — DIFFERENTIAL
Basophils Absolute: 0 10*3/uL (ref 0.0–0.1)
Basophils Absolute: 0 10*3/uL (ref 0.0–0.1)
Basophils Relative: 0 % (ref 0–1)
Basophils Relative: 0 % (ref 0–1)
Eosinophils Absolute: 0 10*3/uL (ref 0.0–0.7)
Eosinophils Relative: 0 % (ref 0–5)
Eosinophils Relative: 0 % (ref 0–5)
Lymphocytes Relative: 10 % — ABNORMAL LOW (ref 12–46)
Monocytes Absolute: 0.8 10*3/uL (ref 0.1–1.0)
Neutro Abs: 14.2 10*3/uL — ABNORMAL HIGH (ref 1.7–7.7)
Neutro Abs: 17.8 10*3/uL — ABNORMAL HIGH (ref 1.7–7.7)

## 2011-02-01 LAB — HEPATIC FUNCTION PANEL
Albumin: 2.4 g/dL — ABNORMAL LOW (ref 3.5–5.2)
Alkaline Phosphatase: 33 U/L — ABNORMAL LOW (ref 39–117)
Total Protein: 5.6 g/dL — ABNORMAL LOW (ref 6.0–8.3)

## 2011-02-01 LAB — POCT I-STAT 3, ART BLOOD GAS (G3+)
Acid-base deficit: 3 mmol/L — ABNORMAL HIGH (ref 0.0–2.0)
Bicarbonate: 20.6 mEq/L (ref 20.0–24.0)
Patient temperature: 97.6
TCO2: 22 mmol/L (ref 0–100)

## 2011-02-01 LAB — URINALYSIS, ROUTINE W REFLEX MICROSCOPIC
Leukocytes, UA: NEGATIVE
Nitrite: NEGATIVE
Specific Gravity, Urine: 1.014 (ref 1.005–1.030)
Urobilinogen, UA: 0.2 mg/dL (ref 0.0–1.0)

## 2011-02-01 LAB — HEMOCCULT GUIAC POC 1CARD (OFFICE): Fecal Occult Bld: POSITIVE

## 2011-02-01 LAB — POCT CARDIAC MARKERS
CKMB, poc: <1 ng/mL — ABNORMAL LOW (ref 1.0–8.0)
Myoglobin, poc: 461 ng/mL (ref 12–200)
Troponin i, poc: <0.05 ng/mL (ref 0.00–0.09)

## 2011-02-01 LAB — URINE CULTURE
Colony Count: NO GROWTH
Colony Count: NO GROWTH

## 2011-02-01 LAB — CULTURE, BLOOD (ROUTINE X 2)
Culture: NO GROWTH
Culture: NO GROWTH

## 2011-02-01 LAB — URINALYSIS, MICROSCOPIC ONLY
Glucose, UA: NEGATIVE mg/dL
Specific Gravity, Urine: 1.013 (ref 1.005–1.030)
pH: 7.5 (ref 5.0–8.0)

## 2011-02-01 LAB — HEMOGLOBIN AND HEMATOCRIT, BLOOD: HCT: 21.4 % — ABNORMAL LOW (ref 39.0–52.0)

## 2011-02-01 LAB — PHOSPHORUS
Phosphorus: 2.1 mg/dL — ABNORMAL LOW (ref 2.3–4.6)
Phosphorus: 3.1 mg/dL (ref 2.3–4.6)

## 2011-02-01 LAB — ABO/RH: ABO/RH(D): A POS

## 2011-02-01 LAB — PROTIME-INR: Prothrombin Time: 18.9 seconds — ABNORMAL HIGH (ref 11.6–15.2)

## 2011-02-01 LAB — CK TOTAL AND CKMB (NOT AT ARMC): Relative Index: 1.2 (ref 0.0–2.5)

## 2011-02-01 LAB — CORTISOL: Cortisol, Plasma: 17.1 ug/dL

## 2011-02-01 LAB — HEMOGLOBIN A1C
Hgb A1c MFr Bld: 7.7 % — ABNORMAL HIGH (ref 4.6–6.1)
Mean Plasma Glucose: 174 mg/dL

## 2011-02-01 LAB — LACTIC ACID, PLASMA: Lactic Acid, Venous: 2.1 mmol/L (ref 0.5–2.2)

## 2011-02-01 LAB — D-DIMER, QUANTITATIVE: D-Dimer, Quant: 0.22 ug/mL-FEU (ref 0.00–0.48)

## 2011-02-01 LAB — MRSA PCR SCREENING: MRSA by PCR: NEGATIVE

## 2011-02-06 ENCOUNTER — Telehealth: Payer: Self-pay | Admitting: Cardiology

## 2011-02-06 NOTE — Telephone Encounter (Signed)
PT IS NOT DOING WELL, HE IS NOT "ACTING RIGHT" "CANNOT REMEMBER"

## 2011-02-06 NOTE — Telephone Encounter (Addendum)
Pt's sister Thomasenia Sales called, pt is c/o headache, not able to remember, and not acting right.  RN advised Thomasenia Sales to take pt to ER to be evaluated.  Pt's is refusing to go to ER.  RN advised Thomasenia Sales to call Dr. Rinaldo Cloud office and request to be seen ASAP.  Thomasenia Sales will call Dr. Rinaldo Cloud office and get in to be seen today ASAP.

## 2011-03-11 NOTE — Assessment & Plan Note (Signed)
OFFICE VISIT   [name], [name]  DOB:  [birth year]                                       11/21/2009  EAVWU#:[id number]   The patient returns for follow-up today.  He was last seen on October 31, 2009.  He previously underwent a right above-knee amputation in August  2010.  Since that time he has had some difficulty wearing his prosthetic  due to pain at the end of the stump.  He does not really have pain  except when he is wearing the prosthetic.  He has also noticed a  crunching type sounding in the end of it.  Unfortunately he continues to  smoke one pack of cigarettes per day.   He has had no real significant change since the last office visit.  We  did obtain an x-ray of his right knee which showed some osteopenia and  possible concern for osteomyelitis in the distal end of his femur.   On exam today temperature is 97.8.  We are unable to measure his blood  pressure after three attempts and he has known subclavian occlusive  disease, heart rate is 82 and regular.  Chest:  Clear to auscultation.  Heart:  Exam is regular rate and rhythm.  Right lower extremity shows a  well-healed above knee amputation.  There is no drainage.  There is no  erythema.  He does have crepitus at the tip of the stump.   I discussed with the patient today the options of a revision of his  above-knee amputation and trimming back the femur or trying a course of  antibiotic therapy to see if this fixed the problem first.  He has opted  to try conservative management with antibiotic therapy.  In light of  this we have scheduled him to see Dr. Sampson Goon with infectious disease  to consider antibiotic treatment of this.  We have also ordered a sed  rate and a CBC at Dr. Mr. Jarrett Ables request today.  The patient will  follow up with me after his treatment course for what is presumably  osteomyelitis of his right AKA stump.  He will also continue to follow  up in our graft  surveillance protocol for his left femoral below knee  popliteal bypass.     [name]. Fields, MD  Electronically Signed   CEF/MEDQ  D:  11/21/2009  T:  11/21/2009  Job:  [id number]   cc:   Mick Sell, MD  Dr. Evlyn Kanner

## 2011-03-11 NOTE — Procedures (Signed)
BYPASS GRAFT EVALUATION   INDICATION:  Follow up left leg bypass graft.   HISTORY:  Diabetes:  Yes, on insulin.  Cardiac:  CABG in November 2002.  Hypertension:  Yes.  Smoking:  Two packs per day for 40+ years.  Previous Surgery:  Left femoral-to-popliteal artery bypass graft with  PTFE and saphenous vein on 09/04/2005 by Dr. Darrick Penna.  Right below-knee amputation on 02/23/2006 by Dr. Darrick Penna.   SINGLE LEVEL ARTERIAL EXAM                               RIGHT              LEFT  Brachial:  Anterior tibial:  Posterior tibial:  Peroneal:  Ankle/brachial index:   PREVIOUS ABI:  Date:  RIGHT:  Below-knee amputation.  LEFT:  Calcified.   LOWER EXTREMITY BYPASS GRAFT DUPLEX EXAM:   DUPLEX:  Doppler arterial waveforms are biphasic proximal to the graft  and to the mid graft.  Distal graft and native artery are brisk and  monophasic.   IMPRESSION:  Patent left femoral-to-popliteal artery by pass graft.  Status post right below-knee amputation.  Left ABI not calculated due to medial calcification.   ___________________________________________  [name] Fields, MD   DP/MEDQ  D:  06/09/2007  T:  06/10/2007  Job:  [id number]

## 2011-03-11 NOTE — Assessment & Plan Note (Signed)
OFFICE VISIT   FARREL, GUIMOND  DOB:  12-20-1945                                       10/31/2009  [id number]#:[id number]   The patient returns for followup today.  He previously underwent a right  above knee amputation by Dr. Myra Gianotti in August of 2010.  He states that  since that time he has had difficulty wearing his prosthetic as he has  pain on the end of his distal stump.  He has no pain on the stump when  he is not wearing the prosthetic.  He has no complaints in his left  lower extremity and says he is doing well from this.  Unfortunately he  continues to smoke about one pack of cigarettes per day.   He denies any chest pain or shortness of breath.  He has not had any  skin breakdown or ulcerations.   PHYSICAL EXAM:  Blood pressure is 132/75 in the left arm, heart rate 76  and regular.  Temperature is 98.4.  Lower extremities, he has a 1+ right  femoral pulse.  He has a 2+ left femoral pulse.  He has absent pedal and  popliteal pulses in the left leg.  He has no ulcerations in the left  foot.  He has no ulcerations on his above knee amputation.  He does have  some crepitus on palpation anteriorly and I am wondering whether or not  it may be a loose body in his above knee amputation.  Otherwise this is  well-healed.   He had a graft duplex exam today which showed a patent left femoral to  popliteal bypass graft with some increased velocity proximal to the  graft but overall this looked good.   CHRONIC MEDICAL PROBLEMS:  Continue to include coronary artery disease,  diabetes, hyperlipidemia, hypertension, all of which are currently  controlled.   Overall the patient appears to be doing well.  He does have some  problems with pain on wearing his prosthetic on the right leg.  We will  obtain an x-ray today to make sure that he has no loose body or fracture  of his distal femur.  If this has no obvious reason for pain then we  will refer him back to the  prosthetist to try to increase the padding in  his prosthetic to see if he can walk on this.  He will continue to  follow up in our graft surveillance protocol.   Addendum: some irregularity of aka femur cortex will schedule for follow  up appointment to discuss findings 11/21/2009     [name]. Fields, MD  Electronically Signed   CEF/MEDQ  D:  10/31/2009  T:  11/01/2009  Job:  [id number]

## 2011-03-11 NOTE — Assessment & Plan Note (Signed)
OFFICE VISIT   [name], [name]  DOB:  08-Sep-1946                                       04/05/2010  ZHYQM#:[id number]   The patient presents today for evaluation of eschar on his right AKA.  He reports this has been present for several weeks and become somewhat  larger over this time.  He was seen by Dr. Darrick Penna most recently in  January 2011 and was having trouble with healing the above-knee  amputation on the right at that time.  This was initially done in August  2010.  It had been suggested that he have debridement of this area, and  he refused at that time.  He is having more drainage now.   PAST MEDICAL HISTORY:  Well-documented in his old chart, with a long  smoking history, long history peripheral vascular occlusive disease,  history of prior coronary bypass grafting, insulin-dependent diabetes,  COPD, hypothyroidism, dyslipidemia, hiatal hernia, hypertension and  peripheral neuropathy, chronic atrial fibrillation.   PHYSICAL EXAMINATION:  He is a well-developed, well-nourished white male  appearing stated age.  His amputation site has an eschar of  approximately 2 cm that is through the entire thickness of his skin into  the subcutaneous fat.  There is no evidence of subcutaneous abscess.   I discussed this with the patient and his son.  I have recommended that  he undergo debridement and reclosure of this in the operating room.  We  have scheduled this at [facility] on 06/16 with Dr. Darrick Penna.     Larina Earthly, M.D.  Electronically Signed   TFE/MEDQ  D:  04/05/2010  T:  04/08/2010  Job:  [id number]   cc:   Jeannett Senior A. Evlyn Kanner, M.D.  [name]. Darrick Penna, MD

## 2011-03-11 NOTE — Assessment & Plan Note (Signed)
OFFICE VISIT   ZAI, CHMIEL  DOB:  [birth year]                                       06/09/2007  ZOXWR#:[id number]   Mr. [name] returns to for follow up today for development of an  ulceration on his right below knee amputation.  He previously has had a  right axillary femoral bypass graft which is occluded on this side.  He  has also had multiple femoral popliteal bypass grafts on the right side.  He states the ulcer has been there for approximately three months.  He  also mentions his stump is fairly cool.  He has had no significant  drainage from the ulcer.  He says it has been healing over the last few  weeks.   He had a graft duplex today which showed some mild increase in velocity  proximal to his left fem/pop which is not unchanged.  Remainder of the  bypass is widely patent.  He has palpable pulse in his left foot.  His  right below knee amputation stump is cool from the knee down.  There is  a 1 x 1 cm ulceration in the central aspect of this with some pale-  appearing granulation tissue.   Most likely he has ischemia of his right below knee amputation, and he  had some difficulty healing this up initially.  The circulation to the  right below knee amputation is not very good.  I believe we should  manage this conservatively at first with wound care.  If it does not  heal with local wound care, we will schedule him for an arteriogram with  consideration of angioplasty and stenting on the right side to try to  improve perfusion of this stump.  I would not entertain a new bypass  procedure as the morbidity of this is not worth the benefit.  I  discussed this with Mr. Ong today.   [name]. Fields, MD  Electronically Signed   CEF/MEDQ  D:  06/09/2007  T:  06/11/2007  Job:  [id number]

## 2011-03-11 NOTE — Assessment & Plan Note (Signed)
OFFICE VISIT   CORDERO, SURETTE  DOB:  October 05, 1946                                       08/04/2007  ZOXWR#:[id number]   Mr. Hester returns today for followup of a 1 cm ulceration on his right  below-the-knee-amputation stump.  The stump itself looks good today.  The ulcer is healing somewhat.  The stump is much less dusky.  I believe  since he has some evidence of healing, we should continue to follow this  for now.  He will see me again in one months' time.  Hopefully the wound  will heal spontaneously.  He is wearing his prosthetic some, but thinks  there is a slightly loose fit.  He is going to see the prosthetist  regarding this.   [name]. Fields, MD  Electronically Signed   CEF/MEDQ  D:  08/04/2007  T:  08/05/2007  Job:  [id number]

## 2011-03-11 NOTE — Assessment & Plan Note (Signed)
OFFICE VISIT   [name], [name]  DOB:  Sep 26, 1946                                       07/11/2010  WUJWJ#:[id number]   The patient returns today for followup of his right above knee  amputation revision.  He denies any fever or chills.  He has minimal  drainage from the wound.  He still has a 1 cm x 1 cm open sinus tract in  the central portion of his right above knee amputation.  There is pink  granulation tissue at the base of this.   He will continue to do wet-to-dry dressings on this.  Hopefully he will  be able to quit smoking.  This probably would assist in wound healing.  The perfusion to his right stump is not fantastic and probably this is  impeding wound healing as well.  However, I believe that a revision of  his above knee amputation would be even more difficult to heal.  If the  wound deteriorates will consider revision of the above knee amputation.  Otherwise he will follow up in four months' time and we will get a graft  duplex of his left lower extremity at that time.     [name]. Fields, MD  Electronically Signed   CEF/MEDQ  D:  07/11/2010  T:  07/12/2010  Job:  [id number]

## 2011-03-11 NOTE — Procedures (Signed)
BYPASS GRAFT EVALUATION   INDICATION:  Follow up left femoropopliteal bypass graft.   HISTORY:  Diabetes:  Yes.  Cardiac:  CABG in 2002.  Hypertension:  Yes.  Smoking:  Yes.  Previous Surgery:  Left femoral-to-popliteal bypass graft 09/04/2005 by  Dr. Darrick Penna, right below-the-knee amputation 02/23/2006 by Dr. Darrick Penna.   SINGLE LEVEL ARTERIAL EXAM                               RIGHT              LEFT  Brachial:  Anterior tibial:  Posterior tibial:  Peroneal:  Ankle/brachial index:   PREVIOUS ABI:  Date:  RIGHT:  LEFT:   LOWER EXTREMITY BYPASS GRAFT DUPLEX EXAM:   DUPLEX:  Patent left femoral popliteal bypass graft noted with,  however,elevated velocities noted at proximal anastomosis.   IMPRESSION:  1. Unable to obtain to bilateral ankle-brachial indices due to      calcified vessels.  2. Patent left femoral popliteal bypass graft with elevated velocities      at proximal anastomosis.    ___________________________________________  [name] Fields, MD   CB/MEDQ  D:  10/31/2009  T:  10/31/2009  Job:  [id number]

## 2011-03-11 NOTE — Op Note (Signed)
NAMENOBUO, NUNZIATA               ACCOUNT NO.:  [id number]   MEDICAL RECORD NO.:  [id number]          PATIENT TYPE:  INP   LOCATION:  5524                         FACILITY:  [facility]   PHYSICIAN:  Juleen China IV, MDDATE OF BIRTH:  09/10/1946   DATE OF PROCEDURE:  06/05/2009  DATE OF DISCHARGE:                               OPERATIVE REPORT   PREOPERATIVE DIAGNOSIS:  Right below-knee amputation ulcer.   POSTOPERATIVE DIAGNOSIS:  Right below-knee amputation ulcer.   PROCEDURE PERFORMED:  Right above-knee amputation.   ANESTHESIA:  General.   SURGEON:  1. Charlena Cross, MD   ASSISTANT:  Wilmon Arms, PA   COMPLICATIONS:  None.   DRESSINGS:  Ace wrap and Kerlix.   SPECIMENS:  Right leg.   INDICATIONS:  This is a 65 year old gentleman who has previously  undergone right below-knee amputation.  He has developed an ulcer at his  patella with underlying osteomyelitis.  He has had surrounding erythema.  He comes in today for conversion to an above-knee amputation.   PROCEDURE:  The patient was identified in the holding area and taken to  room #6.  He was placed supine on the table.  General endotracheal  anesthesia was administered.  The patient was prepped and draped in a  standard sterile fashion.  A time-out was called.  Antibiotics were  given.  A fishmouth incision was made with #10 blade.  Cautery was used  to divide the subcutaneous tissue.  The muscle was divided with Bovie  cautery.  The femur was circumferentially exposed.  Periosteal elevator  was used to elevate periosteum.  A Gigli saw was then used to transect  the femur just proximal to the patella.  The anterior surface was  beveled.  Next, the neurovascular bundle was dissected out.  The nerve  was ligated proximal to the cut edge of the femur with a 2-0 silk tie  and then transected.  The artery and vein were ligated with a 2-0 silk  suture ligature and a 0 silk tie.  The remaining portion of the  muscle  was then transected with Bovie cautery.  The wound was then copiously  irrigated.  The hemostasis was  achieved using cautery.  Next, the fascia was reapproximated with  interrupted figure-of-eight 2-0 Vicryl suture.  The skin was closed with  staples.  Sterile dressings were applied.  The patient tolerated the  procedure well and was taken to the recovery room in stable condition.  There were no complications.      Jorge Ny, MD  Electronically Signed     VWB/MEDQ  D:  06/05/2009  T:  06/05/2009  Job:  [id number]

## 2011-03-11 NOTE — Assessment & Plan Note (Signed)
OFFICE VISIT   GAY, RAPE  DOB:  [birth year]                                       06/27/2010  ACZYS#:[id number]   The patient returns for follow-up today.  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  He still has had some  difficulty healing at the central aspect of this.  He was last seen on  August 18th.  He states there is minimal drainage at this point, but he  does not feel that the wound has shrunk much.  Unfortunately he  continues to smoke.  I discussed with him today smoking cessation as a  possible means to assist in his wound healing.  He did not really  express any interest in this.   PHYSICAL EXAMINATION:  Today, blood pressure 136/59 in the right arm,  heart rate 76 and regular.  Temperature is 98.  Right AKA is well-  healed, except for the central portion where there is a 1 cm x 1 cm open  wound.  There is a large amount of fibrinous debris, and this was  debrided in the office today.  There was good bleeding tissue below  this.  The size of the cavity is essentially the same as it was in the  middle of August.  However, there is no further mucopurulent drainage.   He will continue local wound care twice a day.  He will follow up with  me in 2 weeks' time.     [name]. Fields, MD  Electronically Signed   CEF/MEDQ  D:  06/27/2010  T:  06/28/2010  Job:  [id number]

## 2011-03-11 NOTE — Assessment & Plan Note (Signed)
OFFICE VISIT   [name], [name]  DOB:  1946-04-27                                       06/13/2010  EAVWU#:[id number]   The patient returns for followup today.  He underwent revision of his  right above knee amputation on June 16.  He presents today with some  drainage from the central aspect of this.  He denies any fever or  chills.  He is currently not on antibiotics.   PHYSICAL EXAM:  Blood pressure is 143/84 in the left arm, heart rate is  76 and regular.  Temperature is 97.5.  Right above knee amputation is  well-healed except for the very central aspect.  There is a small 1 cm x  1 cm x 1 cm cavity in this area with a small amount of clear /  mucopurulent drainage.  There were also two retained sutures and these  were removed today.  The wound has been packed with normal saline  moistened gauze.  Hopefully this will continue to heal over the next few  weeks.  He will return for followup in 2 weeks.  We will set up home  health wound care for b.i.d. dressing changes.     [name]. Fields, MD  Electronically Signed   CEF/MEDQ  D:  06/13/2010  T:  06/14/2010  Job:  [id number]

## 2011-03-11 NOTE — Consult Note (Signed)
[name], [name]               ACCOUNT NO.:  [id number]   MEDICAL RECORD NO.:  [id number]          PATIENT TYPE:  INP   LOCATION:  2112                         FACILITY:  [facility]   PHYSICIAN:  Aram Beecham B. Eliott Nine, M.D.DATE OF BIRTH:  Mar 28, 1946   DATE OF CONSULTATION:  05/31/2009  DATE OF DISCHARGE:                                 CONSULTATION   REASON FOR CONSULTATION:  Acute on chronic kidney disease.   PRIMARY CARE PHYSICIAN:  Jeannett Senior A. Evlyn Kanner, M.D.   VASCULAR SURGEON:  [name]. Fields, MD   CHIEF COMPLAINT:  Weakness, GI bleeding, right BKA wound.   HISTORY OF PRESENT ILLNESS:  This is a 65 year old male with diabetes  mellitus who came in significant peripheral vascular disease who  presented to the emergency department due to weakness, coffee ground  emesis x1 day and was found to be hypotensive.  The patient had a  chronic right BKA wound that was not healing and the patient was  refusing further surgery per vascular surgery office notes.  On  admission he was on pressors briefly for about an hour and responded  well to fluid hydration.  He was started on vancomycin and Zosyn  empirically for his wounds.  BUN and creatinine was 125/6.29 on  admission which has decreased to BUN 112 and creatinine 5.12 today with  good urine output.  The patient's potassium and acid base status is  fine.  The patient's last creatinine in Dr. Rinaldo Cloud office on April 2010  was 2.3.  On further chart review it appears that the patient has a  history of multiple renal arteries with questionable bilateral renal  artery stenosis per a CT angiogram on August 25, 2005.  Subsequent  renal ultrasound on Feb 25, 2006 shows normal left and right kidneys.  Repeat CT angiogram on January 05, 2008, shows at least two accessory  right renal arteries.  Bilateral main renal arteries are noted to be  patent in this study.   PAST MEDICAL HISTORY:  1. Peripheral vascular disease.  2. Atrial fibrillation.  3. Coronary artery disease, status post bypass.  4. Diabetes mellitus type 2.  5. Hypothyroidism.  6. Hyperlipidemia.  7. Hypertension.  8. History of acute on chronic renal disease.  In 2007 the patient had      Nephrology consult with Dr. Arrie Aran who called this      multifactorial ischemic ATN due to postoperative decline in blood      pressure, on ACE, diuresis.  Creatinine peaked at that time at 4.1,      no hemodialysis required.   PAST SURGICAL HISTORY:  1. Left femoral bypass/revision in 2003/2006.  2. Right BKA 2007.  3. Coronary artery bypass graft.   HOME MEDICATIONS:  The patient is unable to list home medications.  Per  medication list from Dr. Rinaldo Cloud office the patient is on:  1. Coumadin 3 mg 1-1/2 tablets daily.  2. Multivitamin.  3. Metoprolol 50 mg b.i.d.  4. Lotensin HCT 10/12.5 daily.  5. Synthroid 50 mcg daily.  6. Amaryl 4 mg 1/2 tablet daily.  7. Lopid 600 mg  b.i.d.  8. NovoLog 70/30 20 units daily.  9. BuSpar 5 mg b.i.d.  10.Digoxin 1.25 mg p.o. daily.  11.Furosemide 40 mg daily.  12.Zaroxolyn 2.5 mg every other day.  13.Crestor 10 mg 1/2 tablet daily.  14.The patient notes taking multiple Aleve per day for chronic pain.   ALLERGIES:  No known drug allergies.   FAMILY HISTORY:  Noncontributory.   SOCIAL HISTORY:  The patient lives by himself in [place], [place], but his son lives next door and assists him with his health  care.  The patient smokes 1/2 pack per day for many decades.  The  patient is not ambulatory at baseline.  He denies alcohol use or illicit  drug use.   REVIEW OF SYSTEMS:  Positive for black tarry stools, hematemesis,  weakness, negative for chest pain, shortness of breath, or dysuria,  urinary retention.   LABORATORY DATA:  1. Chest x-ray on August 4, shows bibasilar atelectasis with mild      stable cardiomegaly.  No change on x-ray the next day.  2. X-ray of the right knee shows osteomyelitis of the right  patella      and possible lateral femoral condyle.  3. CBC; white blood cell count 11.7 down from 16.8 at admission,      hemoglobin 8.8 down from 9.2, platelets 170 down from 259.  4. Basic metabolic profile; sodium 135, potassium 4.4, chloride 99,      bicarbonate 24, BUN 112, creatinine 5.12, glucose 241, calcium 9.7.      As per HPI the patient has shown continued improvement in BUN and      creatinine with IV hydration.  It appears that the patient's      baseline may be 2.3 based on outpatient creatinine April 2010.  5. MRSA PCR negative.  6. PT 1.6.  7. Digoxin level 0.3.  8. Lactic acid 1.3.  9. Fecal occult blood positive.  10.Cardiac markers negative for ischemia.  11.ABG; 4.721/31.5/44/20.6.  12.Urinalysis; specific gravity 1.014, trace blood, negative nitrites,      negative leukocytes, negative protein.  Microscopy shows 0-2 red      blood cells.   PHYSICAL EXAMINATION:  VITAL SIGNS:  Temperature afebrile, heart rate 72-  107, blood pressure now 108/41, with his blood pressure as low as 75/22.  Respiratory rate 14-19, O2 95% on room air.  Ins and outs; the patient  has had urine output of 125 mL for the past 2 hours.  Over the past 9  hours he has 1220 mL out.  GENERAL:  Alert and oriented x4 in no acute distress.  HEENT:  Neck supple.  Extraocular movements intact.  CARDIOVASCULAR:  Rhythm irregularly irregular, no murmurs.  LUNGS:  Crackles in bases, scattered wheezes.  ABDOMEN:  Positive bowel sounds, soft, no tenderness to palpation.  EXTREMITIES:  Left lower extremity warm, 1-2 good dorsalis pedis pulse,  chronic venous stasis changes.  Right lower extremity with BKA with  black 2-3 cm eschar.   ASSESSMENT/PLAN:  This is a 65 year old male with peripheral vascular  disease, right below knee amputation wound, chronic atrial fibrillation,  hematemesis admitted in septic shock.  1. Acute on chronic renal failure.  The patient has baseline chronic      kidney  disease with creatinine in 2007 at 1.6 and most recent      creatinine April 2010 at 2.3 with a question of renal artery      stenosis who presented with sepsis, on ACE inhibitor.  The      patient's renal function has been improving with blood pressure      stabilization, treatment of sepsis, IV fluid hydration.  He is      currently making urine and we are hopeful that the patient will      return to his most recent baseline creatinine of 2.3.  The patient      has stated that he does not want dialysis.  Currently the patient      has no indication for dialysis.  At this point in time, no imaging      is needed in the setting of the patient's acute illness.  If he      continues to have elevated creatinines and does not trend down to      baseline, further studies to rule out other causes of acute renal      failure should be pursued.  We will follow the patient closely.  2. Septic shock.  Likely due to right lower extremity osteomyelitis.      The patient is on vancomycin and Zosyn.  No longer requires any      pressor support.  Pancultures pending.  Plan for above knee      amputation in the future.  3. Osteomyelitis.  See problem #2.  4. Gastrointestinal bleeding.  The patient on Coumadin at therapeutic      levels.  The patient was given FFP and primary team is following      CBC.  He is currently on Protonix.  GI is consulted and plans to      take him for endoscopy today.  5. Code status.  Per primary team, the patient is a full code.  6. Hypothyroidism.  This problem was seen on previous dictations.  The      patient states he has no thyroid problems and does not take any      replacement.  The patient's home medication list shows that he is      on Synthroid and should resume home medications.      Delbert Harness, MD  Electronically Signed      Duke Salvia. Eliott Nine, M.D.  Electronically Signed    KB/MEDQ  D:  05/31/2009  T:  05/31/2009  Job:  [id number]

## 2011-03-11 NOTE — Op Note (Signed)
NAME:  TANUSH, DREES               ACCOUNT NO.:  [id number]   MEDICAL RECORD NO.:  [id number]          PATIENT TYPE:  AMB   LOCATION:  SDS                          FACILITY:  [facility]   PHYSICIAN:  Charles E. Fields, MD  DATE OF BIRTH:  1946-03-08   DATE OF PROCEDURE:  01/14/2008  DATE OF DISCHARGE:  01/14/2008                               OPERATIVE REPORT   PROCEDURE:  Aortogram with bilateral lower extremity runoff.   PREOPERATIVE DIAGNOSIS:  Nonhealing wound right below-knee amputation.   POSTOPERATIVE DIAGNOSIS:  Nonhealing wound right below-knee amputation.   ANESTHESIA:  Local.   OPERATIVE DETAILS:  After obtaining informed consent, the patient was  taken to the PV lab.  The patient was placed in the supine position on  the angio table.  The patient's entire left upper extremity was prepped  and draped in usual sterile fashion.  Local anesthesia was infiltrated  over the left brachial artery.  Ultrasound guidance was used to  selectively cannulate the left brachial artery using a micropuncture  set.  The L1-4 wire from the micropuncture set was threaded into the  distal axillary artery.  The sheath was then placed over this.  This was  then exchanged for an 035 Wholey wire.  The sheath was then removed and  a 6-French short sheath placed over the guidewire into the left brachial  artery.  The sheath was then flushed thoroughly with a mixture of 200  mcg of nitroglycerin and 2000 units of heparin.  Next, the guidewire was  advanced down into the descending thoracic aorta and down into the  abdominal aorta.  A 5-French pigtail catheter was then placed over the  guidewire down to the distal abdominal aorta.  Abdominal aortogram was  obtained.  This shows patent left and right common iliac arteries as  well as a patent infrarenal aorta with mild atherosclerotic change.  The  left internal iliac artery was diffusely diseased but does give off some  branches proximally.  The right  internal iliac artery was also  significantly diseased proximally, approximately 80% stenosis.  Proximal  external iliac artery was patent bilaterally.   Next, the pigtail catheter was moved down adjacent to the aortic  bifurcation.  The distal and proximal right external iliac artery has  several high-grade stenoses from 80-50% in diameter.  The left external  iliac artery has moderate atherosclerotic changes but no flow-limiting  stenosis.  A right lower extremity runoff view was then obtained.  This  shows filling of the right common femoral artery with occlusion of the  common femoral artery distally.  There was some delayed filling of a  very small profunda branch and a very small right superficial femoral  artery which occludes in the mid thigh.  There were abundant collaterals  around the femoral head which were the primary blood supply to the  thigh.   Next, a left lower extremity runoff view was obtained.  This showed a  patent proximal left femoral to below-knee popliteal bypass.  The  profunda femoris on the left side was patent.  The left superficial  femoral artery occluded at the level of the adductor hiatus.  The above-  knee popliteal artery reconstitute.  The below-knee popliteal artery was  moderate to severely diseased distal to the anastomosis.  There was  runoff via the peroneal artery which was heavily diseased in the left  leg.  The proximal anterior tibial artery was occluded.  The distal  anterior tibial artery did reconstitute and filled all the way to level  the foot as the dorsalis pedis artery.  There was no significant  narrowing of the femoral-popliteal bypass distally.   Next, the 5-French pigtail catheter was pulled back over a guidewire.  The 5-French sheath was then pulled after the ACT was determined to be  less than 200.  Hemostasis was obtained with direct pressure.  The  patient tolerated the procedure well, and there were no complications.  The  patient was taken to the holding area in stable condition.   OPERATIVE FINDINGS:  1. Diffusely diseased with multiple high-grade stenoses in the right      external iliac artery.  2. Occluded distal right common femoral artery, very small superficial      femoral artery on the right side which occluded in the midthigh,      small right profunda femoris artery.  3. Patent left femoral to below-knee popliteal bypass with diffusely      diseased distal below-knee popliteal artery and runoff primarily      via the peroneal artery in the left leg, a short segment occlusion      of the proximal anterior tibial artery with runoff via the dorsalis      pedis artery and distal anterior tibial artery, occlusion of the      posterior tibial artery on the left.      [name]. Fields, MD  Electronically Signed     CEF/MEDQ  D:  01/17/2008  T:  01/18/2008  Job:  [id number]

## 2011-03-11 NOTE — Procedures (Signed)
BYPASS GRAFT EVALUATION   INDICATION:  Followup, left fem-pop bypass graft.   HISTORY:  Diabetes:  Yes.  Cardiac:  CABG in 2002.  Hypertension:  Yes.  Smoking:  Yes.  Previous Surgery:  Please see above.   SINGLE LEVEL ARTERIAL EXAM                               RIGHT              LEFT  Brachial:  Anterior tibial:  Posterior tibial:  Peroneal:  Ankle/brachial index:   PREVIOUS ABI:  Date  RIGHT:  LEFT:   LOWER EXTREMITY BYPASS GRAFT DUPLEX EXAM:   DUPLEX:  Patent left fem-pop bypass graft with increased velocity at  proximal anastomosis.   IMPRESSION:  1. Patent left femoropopliteal bypass graft with increased velocities      noted at the proximal anastomosis.  2. Unable to complete ankle brachial index due to calcified vessel.   Patient was instructed to see Dr. Darrick Penna for his right stump ulcer.   ___________________________________________  [name]. Fields, MD   MG/MEDQ  D:  12/15/2007  T:  12/16/2007  Job:  [id number]

## 2011-03-11 NOTE — Assessment & Plan Note (Signed)
OFFICE VISIT   [name], [name]  DOB:  1946-07-30                                       07/26/2008  WJXBJ#:[id number]   The patient returns for followup today.  He previously underwent left  femoral to below knee popliteal bypass in February of 2003.  He  subsequently had this revised and a vein graft placed in November of  2006.  He had some elevation of the velocity in the proximal aspect of  the graft on his last duplex scan in February of 2009.  He denies any  claudication symptoms in the left leg.  However, he is essentially  nonambulatory.  He is also still having some difficulty healing up an  ulceration over the patellar region of his right leg.  He has previously  undergone a right below knee amputation.   PHYSICAL EXAMINATION:  On physical exam today blood pressure is 136/81  in the left arm, pulse is 81 and regular.  Right lower extremity shows a  well-healed below knee amputation with a 2 x 1 cm ulceration over the  right patella.  This is increased in size by about 20% since his last  office visit.  There is no open wound.  There is dry eschar covering the  wound.  The below knee amputation below this is well-healed.  On his  left lower extremity his left foot is pink and warm and adequately  perfused.  He does not have palpable pedal pulses.   The patient refused to have a graft duplex scan performed today, stated  he would like to skip that on this visit and have the duplex performed  at his next office visit.  I did discuss with him that fixing any  narrowings in the bypass graft are easier and have higher chance of  success if done prior to occlusion.  However, he prefers to wait on the  graft duplex exam.  I also emphasized to him that there was some  proximal narrowing suggested on the last scan but he again wished to  defer the duplex exam at this time.   In summary, the patient has a below knee amputation which is healed but  still  has had difficulty healing the ulceration of his patella.  I  discussed with him today that we could go ahead and fit him for a new  shrinker and go ahead and have him attempt to be refitted for a  prosthetic for the right leg.  Hopefully they can build up some padding  around the area of his patella to keep this from rubbing and impeding  wound healing.  As far as his left leg is concerned he will get a graft  duplex exam at his next followup appointment in 6 months in March of  2010.  He will return sooner if he has any problems.   [name]. Fields, MD  Electronically Signed   CEF/MEDQ  D:  07/26/2008  T:  07/27/2008  Job:  [id number]   cc:   Jeannett Senior A. Evlyn Kanner, M.D.

## 2011-03-11 NOTE — Assessment & Plan Note (Signed)
OFFICE VISIT   REINHARD, SCHACK  DOB:  05-25-1946                                       01/05/2008  ZOXWR#:[id number]   Patient returns for followup today after CT angiogram.  He was last seen  on 12/29/07.  CT angiogram shows a high grade stenosis of his right  external iliac artery.  He also may have a high grade stenosis of his  right common femoral artery.  The left lower extremity proximal femoral  popliteal bypass is patent.  He also had mild renal artery stenosis.   Patient has a difficult situation with a nonhealing ulcer on his right  BKA.  This is probably due to compromised circulation from his external  iliac and common femoral artery stenosis.  I believe that we should be  able to percutaneously angioplasty or stent his right external iliac  artery.  He may need a femoral endarterectomy if this does not improve  flow significantly enough to heal the ulceration; however, currently in  his ulcerated state, he is unable to walk on a prosthetic limb.  I have  scheduled him for a left brachial approach aortogram with run-off,  angioplasty, and stenting for 01/14/08.  He will stop his Coumadin on  the 17th.  We will recheck his INR on the morning of the 20th.  He does  have a history of some mild renal insufficiency, so we will also  evaluate his renal arteries and make sure that his creatinine is  reasonable for the test.  Risks, benefits, possible complications, and  procedure details were explained to the patient today, including but not  limited to increased bleeding risk at the left brachial puncture site.   [name]. Fields, MD  Electronically Signed   CEF/MEDQ  D:  01/05/2008  T:  01/06/2008  Job:  [id number]

## 2011-03-11 NOTE — Assessment & Plan Note (Signed)
OFFICE VISIT   NICHOLS, CORTER  DOB:  1946-01-04                                       04/19/2008  ZOXWR#:[id number]   The patient returns for followup today.  He was last seen in March of  2009.  He has had several episodes of exacerbation and remissions of  nonhealing ulcers on his right below knee amputation.   PHYSICAL EXAMINATION:  On exam today the incisional line ulceration has  now completely healed.  However, he has a new ulcer over his patella on  the right side.  This is approximately 14 mm x 13 mm in diameter.  The  edges are starting to heal in and I believe this will heal  spontaneously.  Currently he is not wearing his prosthetic leg and most  likely this ulceration is due to an area that was rubbing over his  patella.  His blood pressure today is 145/76, heart rate 78 and regular.   Overall the patient is doing well.  Hopefully the wound on his right  knee will heal sooner.  He can begin wearing his prosthetic again.  He  will follow up with me in 3 months' time.   [name]. Fields, MD  Electronically Signed   CEF/MEDQ  D:  04/19/2008  T:  04/20/2008  Job:  [id number]

## 2011-03-11 NOTE — Assessment & Plan Note (Signed)
OFFICE VISIT   BURLEIGH, BROCKMANN  DOB:  20-May-1946                                       11/10/2007  ZYSAY#:[id number]   The patient was last seen in October of 2008.  At that time, he had a 1  cm ulceration on his right below knee amputation that was not healing  well.  On exam today, the stump is cool with some slight erythema.  However, the wound has not deteriorated and there is still a slight  opening in the center aspect of his BKA.  He is acquiring some knee  padding for his prosthetic, and hopefully, he will be able to wear this  at some point in the future.  I did discuss with him today the  possibility of an above knee amputation, since he has really not been  able to walk in his prosthetic leg due to pain in the stump.  However,  he does not wish to consider that at this time.  He will follow up in  August, 2009 for a graft duplex.   [name]. Fields, MD  Electronically Signed   CEF/MEDQ  D:  11/10/2007  T:  11/11/2007  Job:  [id number]

## 2011-03-11 NOTE — Assessment & Plan Note (Signed)
OFFICE VISIT   ROYALTY, DOMAGALA  DOB:  1946/04/12                                       12/29/2007  UJWJX#:[id number]   Mr. 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  He was last seen in January  of 2009.  The wound on his below-knee amputation has been present since  September of 2008.   Recently he has also developed a blister on his right knee.  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  He currently has not been  ambulatory for 2 years.  He does use his prosthetic on the right leg for  transfers.  He previously underwent left femoral-to-below knee popliteal  bypass with a composite graft.  Proximal 5 cm was PTFE.  The remainder  of the graft was reversed greater saphenous vein.  He currently reports  no symptoms in the left leg.   PHYSICAL EXAM:  Today, blood pressure is 123/70, heart rate 80.  Lower  Extremities:  He does not have easily palpable femoral pulses in either  groin.  In the right below-knee amputation there is a 15 mm slit-like  opening in the central aspect of the BKA.  There is a 2 cm ulcerated  area on the front aspect of the patella on the right leg.  Left foot is  warm and adequately perfused.  Popliteal pulse is not easily palpable.  There are no pedal pulses in the left foot.   He had a graft Duplex scan today which did show some elevated velocities  proximally.  Velocity was 299 cm/s; otherwise, the graft was patent with  no areas of increased velocity.   Mr. Mcfadden has a nonhealing wound of his right below-knee amputation.  This has been chronic for several months.  It is not painful to him but  probably does limit his ambulatory ability as well as the fitting of his  prosthetic.  Additionally, he may have  some evidence of a proximal left  vein graft stenosis.  I also examined his upper extremities and he does  not have easily palpable brachial or radial pulses in either upper  extremity.  I believe the best option for him would be a CT angiogram  abdomen and pelvis with runoff in order to assess if he may be a  candidate for some type of percutaneous procedure.  He is not a very  good operative candidate overall.  However, if he did have a high grade  vein graft stenosis in his left leg I would entertain the thought of  revising this since it is in a viable extremity currently.  He will  follow up next week after his CT scan.  I also wrote him a prescription  today to fit a knee prosthetic for his right leg, since he has this area  of blistering from poor fit of his prosthesis.   [name]. Fields, MD  Electronically Signed   CEF/MEDQ  D:  12/30/2007  T:  12/30/2007  Job:  [id number]

## 2011-03-11 NOTE — Assessment & Plan Note (Signed)
OFFICE VISIT   Manuel, Abbott  DOB:  04-Apr-1946                                       05/15/2010  UJWJX#:[id number]   The patient returns for followup today.  He underwent revision of his  right above-knee amputation approximately 1 month ago.  He returns today  for further followup.  The incision is fairly well healed except for the  most central portion.  There is a small amount of eschar in this and the  patient states that occasionally there is some bloody drainage from it.  He denies any fevers.  There is no purulent drainage from this today.  There is no real surrounding erythema.  All sutures were removed today.  Overall I believe the above knee amputation is healing.  He will still  be at some risk for recurrent infection but hopefully this will not  impede wound healing.  I will see him again in 1 month's time.     [name]. Fields, MD  Electronically Signed   CEF/MEDQ  D:  05/15/2010  T:  05/16/2010  Job:  [phone]

## 2011-03-11 NOTE — Assessment & Plan Note (Signed)
OFFICE VISIT   DOMINGO, FUSON  DOB:  01/22/1946                                       11/08/2008  ZOXWR#:[id number]   The patient returns for follow-up today.  He was last seen in September  2009.  He is referred today by Dr. Evlyn Kanner for progression of a wound on  his right patellar region.  He apparently had some inflammation and  cellulitis around this recently, he was started on antibiotics by Dr.  Evlyn Kanner.  He states that the inflammation has improved.  He states that he  thinks the wound is healing.   Previously, the wound on his patella was 2 cm x 1 cm in diameter in  September.   PHYSICAL EXAMINATION:  Vital Signs:  Blood pressure is 145/80 in the  right arm, pulse is 84 and regular, temperature is 98.1.  Right Lower  Extremity:  There is a 2 cm x 2 cm ulceration over the right patella.  There is no surrounding erythema.  There is no purulent drainage.  Right  below-knee amputation is well-healed.   I had a lengthy discussion with the patient and his son today.  He has  now developed a chronic wound on his right knee.  This has actually  increased in size 20% at his office visit in September and has now  increased approximately another 50%.  The wound is continuing to grow  despite wound care measures.  I discussed with the patient today that we  should consider a right above-knee amputation.  He is still currently  refusing that.  Unfortunately, he has not been able to walk on his below  knee amputation because of this wound.  I did discuss with him that his  chances of walking again would be greatly increased if we could do an  above-knee amputation and start over again with a new prosthetic.  However, he currently refuses at this time.  I have told him we would  continue to try a short trial of antibiotic therapy and wound care.  I  will see him back in 2 weeks' time.  If the wound is continuing to grow  at that time, I will again counsel him  that he should consider an above-  knee amputation.   [name]. Fields, MD  Electronically Signed   CEF/MEDQ  D:  11/08/2008  T:  11/09/2008  Job:  [id number]   cc:   Jeannett Senior A. Evlyn Kanner, M.D.

## 2011-03-11 NOTE — Assessment & Plan Note (Signed)
OFFICE VISIT   ARSHDEEP, BOLGER  DOB:  06-Jan-1946                                       07/09/2009  UJWJX#:[id number]   HISTORY:  This is as 65 year old gentleman whom I converted to an above-  knee amputation on June 05, 2009.  He comes back for followup.  His  wound has healed.  He is going to get his staples out today.  He has had  multiple bypass procedures in the past performed by Dr. Darrick Penna.  He has  not had an ultrasound surveillance since February 2009.  I am going to  have him come back and see Dr. Darrick Penna in December for a P-scan to  evaluate his existing bypasses.   Jorge Ny, MD  Electronically Signed   VWB/MEDQ  D:  07/09/2009  T:  07/10/2009  Job:  [id number]

## 2011-03-11 NOTE — Assessment & Plan Note (Signed)
OFFICE VISIT   JEFFREN, DOMBEK  DOB:  1946-10-16                                       06/25/2009  CHART#:[id number]   REASON FOR VISIT:  Followup.   HISTORY:  This is a 65 year old gentleman who underwent a right below-  knee amputation by Dr. Darrick Penna, who developed an ulcer at his knee and  this needed to be converted to an above-knee amputation.  This was done  on 06/05/2009.  He comes back today for followup.   His wound looks very healthy.  There is no evidence of infection.  There  is no drainage.   The patient will come back in approximately 10 days for staple removal.  He will then proceed on to [facility] for discussions of a new  prosthesis.   Jorge Ny, MD  Electronically Signed   VWB/MEDQ  D:  06/25/2009  T:  06/26/2009  Job:  [phone]

## 2011-03-11 NOTE — Assessment & Plan Note (Signed)
OFFICE VISIT   KEEAN, [name]  DOB:  11/17/1945                                       07/07/2007  ZOXWR#:[id number]   Patient returns today for followup of a nonhealing ulcer in his right  below-knee amputation.   On exam, today, the ulcer is approximately 1 x 1 cm in diameter.  There  is a small amount of yellowish drainage.  There is no surrounding  erythema.  The ulcer is not changed significantly; however, [name]  thinks it is less deep than it was previously.  I believe the best  option for him now is continued local wound care.  He does not want to  have an above-the-knee amputation if the wound continues to break down  and deteriorate.   I will have him follow up in one months time.  If the wound is still not  healing at that time, we will do an arteriogram to consider angioplasty  and stenting of his superficial femoral artery or iliac system to try to  increase perfusion to the stump.   [name]. Fields, MD  Electronically Signed   CEF/MEDQ  D:  07/07/2007  T:  07/08/2007  Job:  [id number]

## 2011-03-14 NOTE — Op Note (Signed)
NAMERAFFI, MILSTEIN               ACCOUNT NO.:  [id number]   MEDICAL RECORD NO.:  [id number]          PATIENT TYPE:  AMB   LOCATION:  SDS                          FACILITY:  [facility]   PHYSICIAN:  Larina Earthly, M.D.    DATE OF BIRTH:  12-28-1945   DATE OF PROCEDURE:  09/03/2005  DATE OF DISCHARGE:                                 OPERATIVE REPORT   PREOPERATIVE DIAGNOSIS:  Bilateral lower extremity arterial insufficiency.   POSTOPERATIVE DIAGNOSIS:  Bilateral lower extremity arterial insufficiency.   PROCEDURE:  Aortogram with bilateral lower extremity runoff.   SURGEON:  Larina Earthly, M.D.   ASSISTANT:  Nurse.   ANESTHESIA:  1% lidocaine local.   COMPLICATIONS:  None.   DISPOSITION:  To holding area, stable.   PROCEDURE IN DETAIL:  The patient was taken to the peripheral vascular cath  lab and placed in a supine position, where the area of both groins was  prepped and draped in the sterile fashion.  Using local anesthesia, the left  common femoral artery was entered with a single-wall puncture and a Rosen  wire was passed up to the level of the suprarenal aorta.  A 5-French sheath  was passed over the guidewire and a pigtail catheter positioned up to the  level of the renal arteries.  The patient had aortogram from AP projection  and this revealed a widely patent right renal artery; there was a 75% left  renal artery stenosis in an upper pole renal artery; the patient had 2 renal  arteries and there was no stenosis in the lower pole of the left renal  artery.  There was no evidence of flow-limiting stenosis in the aorta or  proximal iliac segments.  The pigtail catheter was then exchanged for an IMA  catheter and the right common iliac artery was selectively catheterized.  A  runoff was obtained with a bolus-chase technique.  The patient had diffuse  disease in the external iliac artery with a significant stenosis just above  the inguinal ligament on the left.  The patient  had a prior fem-pop bypass  that appeared to be a prostatic graft; this was widely patent.  The patient  had extremely poorly developed profundus femoris artery.  The right fem-pop  bypass was patent to the below-knee popliteal artery with no evidence of  stenosis at the distal anastomosis.  The distal anastomosis was widely  patent.  There was runoff via the anterior tibial, posterior tibial and  peroneal arteries.  The peroneal and posterior tibial arteries were  extremely small throughout their course.  The dominant runoff was via the  anterior tibial artery, which also had significant stenoses throughout its  course.  Next, the IMA catheter was removed and left leg runoff was obtained  via the left femoral sheath.  There was no significant inflow stenosis on  the left.  There was an occlusion of a prior-placed femoral-to-popliteal  bypass with a small nub of proximal graft open over approximately 1 cm and  the superficial femoral and profunda femoris arteries were open proximally.  The superficial femoral artery  was occluded at the level of the adductor  canal with diseased, reconstituted below-knee popliteal artery.  The  peroneal artery was a single-runoff vessel on the left that extended down to  the foot.  There was a very diseased posterior tibial artery that  reconstituted late at the ankle.   FINDINGS:  1.  Left upper pole renal artery stenosis of 75%.  2.  Right distal external iliac artery stenosis.  3.  Patent right femoral-to-below-knee-popliteal bypass with diseased      peroneal runoff as described.  4.  Left superficial femoral artery occlusion with reconstituted diseased      popliteal artery below the knee with single-vessel peroneal artery      runoff.      Larina Earthly, M.D.  Electronically Signed     TFE/MEDQ  D:  09/03/2005  T:  09/03/2005  Job:  [id number]

## 2011-03-14 NOTE — Op Note (Signed)
[name], [name]               ACCOUNT NO.:  [id number]   MEDICAL RECORD NO.:  [id number]          PATIENT TYPE:  INP   LOCATION:  5022                         FACILITY:  [facility]   PHYSICIAN:  [name]. Fields, MD  DATE OF BIRTH:  Jun 28, 1946   DATE OF PROCEDURE:  04/06/2006  DATE OF DISCHARGE:                                 OPERATIVE REPORT   PROCEDURE:  Revision of right below-knee amputation.   PREOPERATIVE DIAGNOSIS:  Nonhealing wound right below-knee amputation.   POSTOPERATIVE DIAGNOSIS:  Nonhealing wound right below-knee amputation.   ANESTHESIA:  General.   ASSISTANT:  Delight Hoh, R.N.   OPERATIVE FINDINGS:  Clean wound right below-knee amputation with viable  tissue.   OPERATIVE DETAIL:  After obtaining informed consent, the patient was taken  to the operating room.  The patient was placed in the supine position on the  operating table.  After induction of general anesthesia and endotracheal  intubation, the patient's right lower extremity was prepped and draped in  the usual sterile fashion.  Next, the tibia was debrided back approximately  2 cm above the skin incision.  Several areas of fibrinous tissue along the  posterior flap were debrided.  There was good perfusion and bleeding of the  tissues.  Next, the posterior flap was approximated to the anterior flap  fascia using interrupted 2-0 silk sutures.  The skin was then closed on the  lateral aspect of the leg.  The skin on the medial aspect was on tension.  Therefore, this was left opened.  Skin was closed with interrupted #1 nylon  sutures.   The wound was thoroughly irrigated with pulse lavage prior to closing the  fascia and skin.  The patient tolerated the procedure well, and there were  no complications.  Instrument, sponge, and needle count was correct at the  end of the case.  The patient was taken to the recovery room in stable  condition.      [name]. Fields, MD  Electronically  Signed     CEF/MEDQ  D:  04/06/2006  T:  04/06/2006  Job:  [id number]

## 2011-03-14 NOTE — Discharge Summary (Signed)
[name], [name]               ACCOUNT NO.:  [id number]   MEDICAL RECORD NO.:  [id number]          PATIENT TYPE:  INP   LOCATION:  5015                         FACILITY:  [facility]   PHYSICIAN:  [name]. Fields, MD  DATE OF BIRTH:  1946/01/19   DATE OF ADMISSION:  03/14/2005  DATE OF DISCHARGE:  03/20/2005                                 DISCHARGE SUMMARY   PRIMARY ADMITTING DIAGNOSES:  1.  Cellulitis right lower extremity.  2.  Gangrenous right second toe.   ADDITIONAL/DISCHARGE DIAGNOSES:  1.  Cellulitis right lower extremity.  2.  Gangrenous right second toe.  3.  Coronary artery disease status post coronary artery bypass grafting.  4.  Type 2 diabetes mellitus.  5.  Hypothyroidism.  6.  Chronic obstructive pulmonary disease.  7.  Dyslipidemia.  8.  Atrial fibrillation on chronic Coumadin.  9.  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  10. Ongoing tobacco abuse.   PROCEDURE PERFORMED:  Right second toe amputation.   HISTORY:  The patient is a 65 year old white male with a long history of  peripheral vascular occlusive disease who is well-known to [facility].  He was  recently admitted to [facility] from Mar 05, 2005 to Mar 09, 2005  for cellulitis of his right lower extremity.  He was treated with IV  antibiotics and he was discharged home with cellulitis resolved.  He did go  home on Keflex for an additional week.  On Mar 14, 2005 he presented to the  office with recurrence of right leg swelling and redness.  On exam he was  noted to have a recurrent cellulitis of his right lower extremity.  It was  felt that he should be admitted at this time for additional IV antibiotic  therapy and further evaluation of his gangrenous right second toe.   HOSPITAL COURSE:  He was admitted to [facility] on Mar 14, 2005 and  was started on IV antibiotics.  He was treated with broad-spectrum IV  coverage with Zosyn.   After 48 hours on antibiotics his cellulitis had  improved significantly.  He underwent an MRI to evaluate for any possible  osteomyelitis and this showed only extensive soft tissue inflammatory  changes consistent with his diffuse cellulitis.  He was also noted on  physical exam to have gangrenous right second toe which had been present and  nonhealing.  It was felt that during this admission he should undergo  amputation of the right second toe.  He was taken to the operating room on  Mar 17, 2005 and underwent this procedure performed by Dr. Darrick Penna.  The site  was left open and he has continued with normal saline wet-to-dry dressing  changes three times daily since then.  The toe amputation site is stable  presently with no evidence of purulence.  His cellulitis has almost  completely resolved.  He has been restarted on Coumadin which he takes  chronically at home for atrial fibrillation.  During this admission he has  remained afebrile and all vital signs  have been stable.  He has been  restarted on all of his home medications and his blood sugars have remained  stable.  He has been seen and followed during this admission by Dr. Evlyn Kanner  his primary care physician.  His most recent labs show a hemoglobin of 12.3,  hematocrit 36.8, platelets 302, white count 12.4, sodium 136, potassium 3.9,  BUN 12, creatinine 1.2, PT 17.1, INR 1.6.  It is felt that if he continues  to remain stable over the over the next 24 hours and his toe amputation site  shows no evidence of changes, with ongoing wound care, he will hopefully be  ready for discharge home on Mar 20, 2005.  A decision will be made at that  time regarding whether he will need further p.o. antibiotic therapy upon  discharge.  Otherwise discharge medications will be as follows:  1.  Lasix 20 mg daily.  2.  Coumadin 5 mg Monday, Wednesday, Friday, Sunday and 2.5 mg Tuesday,      Thursday and Saturday.  3.  Lopid 600 mg b.i.d.  4.  VYTORIN  10/40 daily.  5.  Amaryl 2 mg daily.  6.  Metoprolol 50 mg daily.  7.  Avandia 4 mg daily.  8.  Synthroid 50 mcg daily.  9.  Tylox 1-2 q.4h. p.r.n. for pain.  10. Lantus insulin at his regular home dose.   DISCHARGE INSTRUCTIONS:  He is asked to refrain from driving, heavy lifting  or strenuous activity.  He may continue ambulating daily with a Darco shoe,  and assistance if needed.  He will continue his same preoperative diet.  Home health nurse will be arranged for ongoing dressing changes with normal  saline wet-to-dry gauze to the toe amputation site b.i.d.  He will follow up  with Dr. Darrick Penna at his regularly scheduled appointment on April 11, 2005 at  9:45 a.m.  If he has problems in the interim, he will contact our office.      GC/MEDQ  D:  03/19/2005  T:  03/19/2005  Job:  [id number]   cc:   Jeannett Senior A. Evlyn Kanner, M.D.  [address]  [place]  [place] [address]  Fax: [phone]

## 2011-03-14 NOTE — Consult Note (Signed)
NAMEKNOWLEDGE, ESCANDON NO.:  [id number]   MEDICAL RECORD NO.:  [id number]           PATIENT TYPE:   LOCATION:                               FACILITY:  [facility]   PHYSICIAN:  Terrial Rhodes, M.D.DATE OF BIRTH:  1945/11/13   DATE OF CONSULTATION:  DATE OF DISCHARGE:                                   CONSULTATION   CONSULTING PHYSICIAN:  Terrial Rhodes, M.D.   REASON FOR CONSULTATION:  Acute on chronic renal failure.   HISTORY OF PRESENT ILLNESS:  [name] is a 65 year old white male with  multiple medical problems most notable for diffuse atherosclerotic vascular  disease status post bilateral fem-pop bypass graft surgeries, coronary  artery disease status post four-vessel bypass graft surgery, diabetes,  hypertension, tobacco abuse, COPD, and bilateral renal artery stenosis who  was admitted, on February 06, 2006, by Dr. Darrick Penna for a clotted right femoral-  pop bypass graft.  He underwent an attempted axillary fem bypass graft due  to a gangrenous foot, however, he had an eventual loss of his limb due to  gangrene and is status post a right BKA, by Dr. Darrick Penna, performed on February 23, 2006.   We were asked to see the patient for rising creatinine.  The trend is as  follows:  February 16, 2006    February 17, 2006    February 21, 2006    February 22, 2006.   Feb 24, 2006     Feb 25, 2006  Creatinine 1.5.   Creatinine 1.5.   Creatinine 1.7.   Creatinine 1.4.  Creatinine 3.4.   Creatinine was 4.1.   We have been asked to help further evaluate and mange his rising creatinine.  Of note, the patient did have some intraoperative and perioperative  hypotension with systolic blood pressures down into the 80s, while taking an  ACE inhibitor, potassium supplements, and diuretic therapy.  These have  subsequently been held prior to our consult and he had also been on Zosyn at  3.375 grams IV q.8h. which was recently changed to a renal dose.   ALLERGIES:  No known drug  allergies.   PAST MEDICAL HISTORY:  1.  Atherosclerotic vascular disease.      1.  Status post right fem-pop bypass graft surgery in March 2004.      2.  Left fem-pop bypass graft surgery in February 2003.      3.  Right axillary fem bypass graft on February 06, 2006.      4.  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      5.  Bilateral renal artery stenosis.      6.  Coronary artery disease, status post four-vessel CABG in 2002.  2.  Diabetes mellitus for 30 years, complicated by neuropathy.  3.  COPD.  4.  Tobacco abuse, greater than 75-pack year history, quit smoking about a      month ago.  5.  Hypothyroidism.  6.  Hypertension.  7.  Dyslipidemia.  8.  Atrial fibrillation on Coumadin therapy.  9.  Hiatal hernia.  10. Bilateral renal  artery stenosis seen by CT angiogram on October 2006.      He has two renal arteries on the left, an ostial lesion in the superior      segment which was significant, and likely significant lower left renal      artery.  He has three renal arteries to the right, two of those three      showed narrowing.  11. Chronic kidney disease.  Baseline creatinine of 1.3 to 1.7.  12. Peripheral neuropathy.   CURRENT MEDICATIONS:  1.  Digoxin 0.125 mg daily.  2.  Colace.  3.  Vytorin 10/40.  4.  Hydrochlorothiazide 12.5 mg every day.  5.  Multivitamin one a day.  6.  Lopid 600 mg every day.  7.  Benazepril 10 mg a day currently being held.  8.  Lasix 40 mg a day.  9.  Potassium chloride 20 mEq a day.  10. Amaryl 2 mg a day.  11. Avandia 2 mg a day.  12. Lantus 28 units at bedtime.  13. Colace 100 mg a day.  14. Metoprolol 75 mg b.i.d.  15. Zosyn 2.25 grams IV q.8h.  16. Heparin drip.  17. Oxycodone p.r.n.  18. Tramadol p.r.n.  19. Tylenol p.r.n.  20. Benadryl p.r.n.  21. Zofran p.r.n.  22. Phenergan p.r.n.  23. Labetalol p.r.n.   FAMILY HISTORY:  His mother died at age 69 of old age.  She had atrial  fibrillation.  Father died age 1 from  complications of alcohol.  It sounds  like bleeding esophageal varices.  He has two sisters alive and well.   SOCIAL HISTORY:  He is a widower for the last six years.  He has two sons,  one is obese with diabetes and smokes.  He has a history of smoking and  alcohol.  He quit drinking about a year ago.  He quit smoking about a month  ago.  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   REVIEW OF SYSTEMS:  GENERAL:  He denies any anorexia or malaise.  OPHTHALMIC:  No blurred vision, photophobia.  ENT:  No tinnitus, dysphagia,  odynophagia.  CARDIAC:  No chest pain, palpitations, orthopnea, PND.  PULMONARY:  No shortness of breath, hemoptysis, productive cough.  GI:  No  nausea, vomiting, hematochezia, melena, bright red blood per rectum.  GU:  He has a Foley catheter in place.  No dysuria, pyuria, hematuria, urgency,  frequency.  RHEUMATOLOGIC:  He has no pain currently.  He does have some  neuropathy.  DERMATOLOGIC:  No rashes, lumps, or bumps.  HEMATOLOGIC:  No  bleeding or bruising.  All other systems negative.   PHYSICAL EXAMINATION:  GENERAL:  This is a well developed obese man in no  apparent distress.  VITAL SIGNS:  Temperature 96.6, pulse 92, blood pressure 123/68, respiratory  rate 18.  Urine output has been 1200 yesterday.  Input was 3700.  HEENT:  Head normocephalic atraumatic.  Pupils are equal, round, and  reactive to light.  Extraocular muscles intact.  No icterus.  Oropharynx  without lesions.  NECK:  Supple.  No lymphadenopathy.  No bruits.  LUNGS:  Significant for bibasilar crackles on early inspiration.  No rubs or  rhonchi appreciate .  CARDIAC:  Irregularly irregular.  No precordial rub appreciated.  ABDOMEN:  Obese, normoactive bowel sounds, soft, and nontender.  EXTREMITIES:  He is status post a right BKA.  He has trace  pretibial edema on the left.  No embolic changes to his left foot.  He has 1+ presacral  edema.    LABORATORY:  His white blood cell count is 14.1, hemoglobin 8.8, platelets  235.  Sodium 131, potassium 5.4, chloride 100, CO2 25, BUN 43, creatinine  4.1, glucose 139, calcium 8.5.   ASSESSMENT/PLAN:  1.  Nonoliguric hyperkalemic acute renal failure.  This is likely      multifactorial.  He has a history of bilateral renal artery stenosis as      well as diabetes, has had an angiogram approximately two weeks ago and      was also hypotensive during the intra and perioperative period while      taking an ACE inhibitor, diuretics, and potassium supplements.  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  At the present      time, we agree with holding ACE inhibitor, potassium, diuretics.  We      will check urine studies and renal ultrasound.  __________  and continue      to follow his potassium and creatinine.  Currently no indication for      dialysis at this time and hopefully we will avert the need now with his      blood pressure stable off of ACE inhibitor.  2.  Peripheral vascular disease.  He is status post below the knee      amputation.  He is currently stable.  3.  Anemia.  He dropped his hemoglobin during surgery.  We will continue to      follow.  His hemoglobin at the time of admission was 11, so his anemia      may be related to his chronic kidney disease and diabetes.  We will      consider EPO therapy, although now is most likely due to blood loss from      multiple surgeries and procedures.  We will continue to follow.  4.  Hypertension.  Blood pressure is stable off of ACE inhibitor.  5.  Chronic obstructive pulmonary disease with questionable obstructive       sleep apnea.  He is currently on oxygen.  He has a tobacco abuse history      and also a history of desaturations at night.  Given his obesity, he is      a likely candidate for obstructive sleep apnea and may benefit from      CPAP.  6.  Diabetes mellitus.  CBGs are stable.  7.  Dyslipidemia.  On therapy.   I will continue to follow.  Thank you for this consult.           ______________________________  Terrial Rhodes, M.D.     JC/MEDQ  D:  02/25/2006  T:  02/25/2006  Job:  [id number]

## 2011-03-14 NOTE — Op Note (Signed)
. [facility]  Patient:    [name], [name] Visit Number: [id number] MRN: [id number]          Service Type: SUR Location: 3300 3303 01 Attending Physician:  Colvin Caroli Dictated by:   Quita Skye Hart Rochester, M.D. Proc. Date: 12/01/01 Admit Date:  12/01/2001                             Operative Report  PREOPERATIVE DIAGNOSIS:  Left superficial femoral and tibial occlusive disease with severe claudication left leg.  POSTOPERATIVE DIAGNOSIS:  Left superficial femoral and tibial occlusive disease with severe claudication left leg.  PROCEDURE:  Left common femoral to popliteal (above knee) bypass, using a 6 mm Gore-Tex graft with intraoperative arteriogram.  SURGEON:  Quita Skye. Hart Rochester, M.D.  ASSISTANT:  Loura Pardon, M.D.  ANESTHESIA:  General endotracheal anesthesia.  PROCEDURE:  The patient was taken to the operating room and placed in supine position at which time satisfactory general endotracheal anesthesia was administered.  The left leg was prepped using Betadine scrub and solution and draped in a routine sterile manner.  A short incision was made in the inguinal region and common superficial and profunda femoris artery dissected free. There was diffuse disease in the common femoral artery with posterior plaque and known aortoiliac disease but he did have a good pulse with a soft vessel anteriorly.  The saphenous vein was not exposed being preserved for later use, if necessary.  A medial incision was then made above the knee being careful to avoid injury to the saphenous vein and the popliteal artery was exposed in the above knee space.  It was a diffusely diseased and calcified artery but did have some soft areas distally.  It was dissected down almost to the knee joint.  It was widely patent on the angiogram, however.  A subfascial anatomic tunnel was then created and patient was heparinized.  The popliteal artery was  occluded proximally and distally with vascular clamps, opened longitudinally with a 15 blade, extended with a Potts scissors.  It was a diffusely diseased vessel but had a relatively smooth intimal surface and was patent.  It had good back bleeding.  A 6 mm PTFE Gore-Tex graft was then spatulated and anastomosed end-to-side using continuous 6-0 Prolene.  Attention was then turned to the femoral vessels which were occluded with vascular clamps, longitudinal opening made in the common femoral artery with a 15 blade, extended with a Potts scissors. There was diffuse disease in the common femoral artery and in the proximal iliac artery but there was adequate inflow.  The Gore-Tex was spatulated appropriately and anastomosed end-to-side using continuous 6-0 Prolene. Clamps were then released and there was an excellent pulse in the graft and good Doppler flow.  Intraoperative arteriogram revealed a widely patent anastomosis with a widely patent popliteal artery filling 2 diseased tibial vessels in the anterior tibial and perineal.  Protamine was given to reverse the heparin.  He tolerated the first 40 mg of protamine well although did have some increasing pressures on the ventilator and the following 10 mg of protamine caused a slight drop in the blood pressure but not profound.  Pressure dropped into the 80s systolic and then responded with fluid infusion immediately.  Adequate hemostasis was achieved.  The wounds were irrigated with saline.  Closed in layers with Vicryl in subcuticular fashion with Steri-Strips.  Sterile dressing applied. The patient was taken to the  recovery room in satisfactory condition. Dictated by:   Quita Skye Hart Rochester, M.D. Attending Physician:  Colvin Caroli DD:  12/01/01 TD:  12/01/01 Job: [id number]

## 2011-03-14 NOTE — Op Note (Signed)
[place] [facility] [place]  Patient:    [name], [name] Visit Number: [id number] MRN: [id number]          Service Type: SUR Location: 2000 2021 01 Attending Physician:  Colvin Caroli Dictated by:   Quita Skye Hart Rochester, M.D. Proc. Date: 01/17/02 Admit Date:  01/17/2002 Discharge Date: 01/19/2002                             Operative Report  PREOPERATIVE DIAGNOSES: 1. Lymphocele, left thigh. 2. Post left femoral-popliteal bypass grafting.  POSTOPERATIVE DIAGNOSES: 1. Lymphocele, left thigh. 2. Post left femoral-popliteal bypass grafting.  OPERATION:  Incision and drainage, lymphocele, left thigh.  SURGEON:  Quita Skye. Hart Rochester, M.D.  ANESTHESIA:  LMA.  COMPLICATIONS:  None.  PROCEDURE:  The patient was taken to the operating room and placed in the supine position, at which time satisfactory anesthesia was induced (general), using LMA.  Left leg was then prepped with Betadine scrub and solution and draped in routine sterile manner.  The left thigh had a well-healed incision above the knee for the left femoral popliteal bypass graft which had been inserted about six weeks earlier.  Incision was made through the previous scar and immediately a space with organized hematoma and fluid was entered.  This extended down into the above knee popliteal space to the distal aspect of the adductor canal, where the Gore-Tex graft had been anastomosed at the above knee popliteal artery.  The graft had a 2+ pulse and did not appear infected and fluid was not turbid, but thin blood-tinged fluid, which was sent for aerobic and anaerobic cultures.  There was some organized hematoma along the wall of this fluid collection.  The hematoma was removed using laparotomy pad and there was no bleeding.  Some cauterization of the lymphocele sac was performed, but it was not able to be completely excised since it was in this complex space.  After completely irrigating this with  saline, a round Jackson-Pratt drain was brought out through a securely based stab wound and secured with a nylon suture.  The wound was closed in three layers with interrupted 2-0 Vicryl for the first two layers and interrupted 3-0 nylon and vertical mattress sutures for the skin.  Sterile dressing was applied and the patient was taken to the recovery room in satisfactory condition. Dictated by:   Quita Skye Hart Rochester, M.D. Attending Physician:  Colvin Caroli DD:  01/17/02 TD:  01/17/02 Job: [id number]

## 2011-03-14 NOTE — Discharge Summary (Signed)
NAMEBARRE, AYDELOTT               ACCOUNT NO.:  [id number]   MEDICAL RECORD NO.:  [id number]          PATIENT TYPE:  INP   LOCATION:  5022                         FACILITY:  [facility]   PHYSICIAN:  [name]. Fields, MD  DATE OF BIRTH:  10/21/1946   DATE OF ADMISSION:  03/27/2006  DATE OF DISCHARGE:  04/23/2006                                 DISCHARGE SUMMARY   HISTORY OF PRESENT ILLNESS:  Patient is a 65 year old male who has previous  history of peripheral vascular occlusive disease.  He has underwent  previous, multiple lower extremity revascularizations.  He had a right below-  knee amputation 1 month ago.  He has had nonhealing of the amputation and  needs admission for debridement.   HOSPITAL COURSE:  Patient underwent debridement of his right below-knee  amputation on March 30, 2006.  He underwent serial debridements and wound  care in the hospital.  A wrapped dressing was then placed after granulation  tissue had formed.  Patient was followed by Dr. Evlyn Kanner, has his diabetes  managed as an inpatient by him.  He was also kept on heparin, in the  hospital, for previous history of atrial fibrillation.  As the wound  improved, he was re-anticoagulated with Coumadin.  The wound was then  closed, partially, on April 23, 2006.  He was discharged to home at that  time.   CONDITION ON DISCHARGE:  Improved.   DISCHARGE MEDICATIONS:  Hydrochlorothiazide, Vytorin, multivitamin, Lopid,  Amaryl, Avandia, Lantus insulin, Colace, Lopressor, Lanoxin, Coumadin.      [name]. Fields, MD  Electronically Signed     CEF/MEDQ  D:  07/01/2006  T:  07/01/2006  Job:  [id number]

## 2011-03-14 NOTE — Op Note (Signed)
NAMEDONEVIN, SAINSBURY               ACCOUNT NO.:  [id number]   MEDICAL RECORD NO.:  [id number]          PATIENT TYPE:  INP   LOCATION:  2002                         FACILITY:  [facility]   PHYSICIAN:  [name]. Fields, MD  DATE OF BIRTH:  Dec 27, 1945   DATE OF PROCEDURE:  02/23/2006  DATE OF DISCHARGE:                                 OPERATIVE REPORT   PROCEDURE:  Right below-knee amputation.   PREOPERATIVE DIAGNOSIS:  Gangrene right foot.   POSTOPERATIVE DIAGNOSIS:  Gangrene right foot.   ANESTHESIA:  General.   ASSISTANT:  Nurse.   OPERATIVE FINDINGS:  Pulsatile bleeding right popliteal artery.   OPERATIVE DETAILS:  After obtaining informed consent, the patient is taken  to the operating room.  The patient is placed in supine position on the  operating room table.  After induction of general anesthesia and  endotracheal intubation, a Foley catheter is placed.  Next the patient's  right lower extremity is prepped and draped in the usual sterile fashion.  An incision was made approximately four fingerbreadth's below the tibial  tuberosity and this was carried across the anterior aspect of the leg.  A  long posterior flap was then created.  The incision was carried down through  the subcutaneous tissues down through the fascia.  Portions of the muscle  were divided down to the level of the fibula.  The anterior compartment  muscles were also taken down to the level of the tibia.  The periosteum was  raised on the tibia and fibula.   The tibia was then divided with a giggly saw approximately 3 cm above the  skin edge.  The fibula was divided approximately 2 cm above this.  Amputation knife was then used to complete amputation of the posterior flap.  There was pulsatile bleeding from the popliteal artery.  This was suture  ligated.  Hemostasis was obtained with several suture ligatures.  A portion  of the posterior flap muscle was debulked in order to have a good fit  anteriorly.   Next, the fascial edges were reapproximated using interrupted 2-  0 Vicryl sutures.  A 3-0 Vicryl running subcuticular stitch was placed.  The  skin was closed with staples.  The leg was passed off the table as a  specimen.  The patient tolerate the procedure well and there were no  complications. Instrument, sponge and needle count was correct at the end of  the case. The patient was taken to the recovery room in stable condition.           ______________________________  [name] Fields, MD     CEF/MEDQ  D:  02/23/2006  T:  02/23/2006  Job:  [id number]

## 2011-03-14 NOTE — Op Note (Signed)
   NAME:  [name], [name]                         ACCOUNT NO.:  [id number]   MEDICAL RECORD NO.:  [id number]                   PATIENT TYPE:  INP   LOCATION:  2022                                 FACILITY:  [facility]   PHYSICIAN:  Larina Earthly, M.D.                 DATE OF BIRTH:  1946/05/11   DATE OF PROCEDURE:  02/01/2003  DATE OF DISCHARGE:                                 OPERATIVE REPORT   PREOPERATIVE DIAGNOSIS:  Open right distal thigh and popliteal wound.   POSTOPERATIVE DIAGNOSIS:  Open right distal thigh and popliteal wound.   OPERATION PERFORMED:  Debridement and partial closure of right distal thigh  and popliteal wound.   SURGEON:  Larina Earthly, M.D.   ASSISTANT:  Nurse.   ANESTHESIA:  LMA.   COMPLICATIONS:  None.   DISPOSITION:  To recovery room stable.   DESCRIPTION OF PROCEDURE:  The patient was taken to the operating room and  placed supine position where the area of the right leg was prepped and  draped in the usual sterile fashion.  The patient had a prior femoral-  popliteal bypass and had prior opening of a popliteal space wound on January 21, 2003.  This responded well to local wound care and is here for closure.  The fibrinous exudate was debrided from the wound and there was a very nice  granulating base throughout and bleeding was controlled with electrocautery.  The upper portion of the incision in the above-knee position had two  interrupted 2-0 Vicryl sutures for closure of the deep space.  Next, the  skin was closed with 2-0 nylon mattress sutures.  This was closed down to  just above the knee and one single suture was used to partially close the  lower pole of the incision.  The middle portion of the incision was not  closed due to too much tension.  The open area of the wound was packed with  normal saline and dressings and a sterile dressing was applied.  The patient  tolerated the procedure well without immediate complication and was  transferred  to the recovery room in stable condition.                                                Larina Earthly, M.D.    TFE/MEDQ  D:  02/01/2003  T:  02/02/2003  Job:  [id number]

## 2011-03-14 NOTE — H&P (Signed)
[place]. [facility]  Patient:    [name], [name] Visit Number: [id number] MRN: [id number]          Service Type: Attending:  Colleen Can. Deborah Chalk, M.D. Dictated by:   Jennet Maduro Earl Gala, R.N., A.N.P. Adm. Date:  09/14/01   CC:         Jeannett Senior A. Evlyn Kanner, M.D.  Quita Skye Hart Rochester, M.D.   History and Physical  DATE OF BIRTH:  08/01/1946.  CHIEF COMPLAINT:  Nonhealing left foot ulcer.  HISTORY OF PRESENT ILLNESS:  [name] is a 65 year old white male who was referred to our office for cardiac clearance.  He he had upcoming femoral-popliteal bypass grafting planned for September 13, 2001.  He had a preoperative adenosine Cardiolite study performed.  His ejection fraction was noted to be reduced as well as with equivocal scintigraphic images.  He is now referred for elective coronary angiography.  He has been recently diagnosed with atrial fibrillation.  The duration of that is unknown.  He is not on Coumadin therapy.  He has had no chest pain, no shortness of breath.  PAST MEDICAL HISTORY: 1. Ongoing tobacco abuse. 2. Atrial fibrillation, questionable duration. 3. Diabetes over 20 years. 4. History of right knee surgery in 1984. 5. He has had left thumb surgery in the past. 6. He has been hospitalized in 1984 for his diabetes.  ALLERGIES:  None.  MEDICATIONS:  He does take a diabetic pill as well as an aspirin and multivitamin daily.  He does not know the name of his diabetic agent.  FAMILY HISTORY:  His father died with esophageal hemorrhage.  His mother is alive at the age of 65 and has heart disease.  SOCIAL HISTORY:  He is a widower.  He is employed at [employer]. He smokes at least one to two packs of cigarettes a day and has done so for the past 40+ years.  He does engage in occasional alcohol use.  He lives at home alone.  He has two sons.  REVIEW OF SYSTEMS:  Basically as noted above.  He has had no chest pain, no real  shortness of breath.  He has had generalized weakness and fatigue, worse over the past year.  He has had no complaints of palpitations.  There has been no syncope.  He had a nonhealing ulcer on the left heel, which caused him to seek evaluation with Dr. Rodell Perna office.  He does need left femoral-popliteal bypass grafting to that extremity.  He has had no recent fevers or chills, and otherwise review of systems is unremarkable.  PHYSICAL EXAMINATION:  VITAL SIGNS:  Blood pressure is 150/70 sitting, 140/70 standing.  Heart rate is 60 and fairly regular.  Respirations are 20.  He is afebrile.  His weight is 206 pounds.  GENERAL:  He is a pleasant white male.  He appears somewhat older than his recorded age.  SKIN:  Warm and dry.  Color is unremarkable.  NECK:  Supple.  No JVD, no bruits.  CHEST:  Lungs are basically clear.  CARDIAC:  An irregular rhythm.  ABDOMEN:  Obese, positive bowel sounds, nontender without masses.  EXTREMITIES:  Without edema.  There is a nonhealing ulcer on the left heel, approximately a quarter size in size.  LABORATORY DATA:  Pending.  OVERALL IMPRESSION: 1. Abnormal adenosine Cardiolite. 2. Peripheral vascular disease, current nonhealing ulcer of the left foot. 3. Atrial fibrillation, duration unknown. 4. Elevated blood pressure. 5. Ongoing tobacco abuse.  PLAN:  Will need to proceed on with elective coronary angiography.  The procedure has been discussed in full detail, and he is willing to proceed. Dictated by:   Jennet Maduro Earl Gala, R.N., A.N.P. Attending:  Colleen Can. Deborah Chalk, M.D. DD:  09/13/01 TD:  09/13/01 Job: [id number]

## 2011-03-14 NOTE — Consult Note (Signed)
[place]. [facility]  Patient:    [name], [name] Visit Number: [id number] MRN: [id number]          Service Type: CAT Location: 2300 2399 02 Attending Physician:  Eleanora Neighbor Dictated by:   Gwenith Daily Tyrone Sage, M.D. Proc. Date: 09/14/01 Admit Date:  09/14/2001   CC:         Colleen Can. Deborah Chalk, M.D.  Tera Mater. Evlyn Kanner, M.D.   Consultation Report  REQUESTING PHYSICIAN:  Colleen Can. Deborah Chalk, M.D.  FOLLOW-UP CARDIOLOGIST:  Colleen Can. Deborah Chalk, M.D.  PRIMARY CARE PHYSICIAN:  Jeannett Senior A. Evlyn Kanner, M.D.  REASON FOR CONSULTATION:  Three vessel coronary artery disease with inferior hypokinesis.  HISTORY OF PRESENT ILLNESS:  Patient complains of at least six months of increasing fatigue and overall tiredness with some mild shortness of breath with exertion but he denies any specific chest pain.  Because of a 52-month-old non-healing ulcer of his left foot he was referred to Dr. Hart Rochester.  At the time of examination by Dr. Hart Rochester it was determined that he was in atrial fibrillation.  Catheterization was performed on the lower extremities which revealed diffuse vascular disease.  In anticipation of a left femoral popliteal bypass he was referred to Dr. Deborah Chalk for a cardiology evaluation. Patient underwent cardiac catheterization today.  He has no previous history of myocardial infarction; however, at the time of the catheterization and with Adenosine Cardiolite stress test he was noted to have significant decrease in his ejection fraction and inferior hypokinesis.  Previous cardiac history: Patient denies any previous myocardial infarction or angioplasty.  Cardiac risk factors include hypertension.  His status for lipids is unknown.  He does have a family history of coronary artery disease.  His mother is currently 50 with coronary artery disease.  His father died of esophageal hemorrhage. Denies any previous stroke.  He has had type 2 diabetes for 20  years. Hemoglobin A1C is unknown at this time; however, it has been ordered.  Patient is poorly compliant with his diabetes.  He noted he had been on insulin in the past, but not currently.  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  He has had previous knee surgery in 1984 and left thumb surgery.  SOCIAL HISTORY:  Patient is a widower.  Has two sons.  Lives alone.  The sons live nearby.  He owns and runs a Doctor, general practice.  He had occasional alcohol use.  He continues with heavy smoking and has for 40 years.  MEDICATIONS: 1. Patient notes that he takes a diabetic pill, but does not know what it is. 2. One aspirin q.d. 3. Trental one t.i.d. 4. Glucovance 5/500 b.i.d. 5. Lotensin 10 mg q.d.  ALLERGIES:  Denies.  REVIEW OF SYSTEMS:  CARDIAC:  Patient denies chest pain.  He does have lower extremity edema.  He denies resting shortness of breath.  He does have palpitations.  Denies syncope, presyncope, orthopnea, or exertional shortness of breath.  He does note tiredness and fatigue that has been getting worse for the past year, but especially the past six months.  GENERAL:  Patient is obese and his weight is unchanged.  He denies any fever or chills.  RESPIRATORY:  As noted, patient is a long-term smoker.  He coughs up clear or yellow sputum in the mornings.  GASTROINTESTINAL:  He denies any blood in stool.  NEUROLOGIC: Denies TIAs or previous stroke.  MUSCULOSKELETAL:  Complains of pain in his right wrist.  GENITOURINARY:  Denies trouble with urination or blood in his urine.  INFECTIOUS:  Patient does have a non-healing ulcer of the left heel. ENDOCRINE:  As noted above, diabetes x 20 years.  PSYCHIATRIC:  Denies. PERIPHERAL VASCULAR:  Has significant peripheral vascular disease and was the primary reason for his current evaluation with non-healing ulcer left  leg.  PHYSICAL EXAMINATION  VITAL SIGNS:  Blood pressure 120/65, pulse 77 and irregular, respiratory rate 18, height 5 feet 11 inches, weight 206 pounds.  GENERAL:  Patient is moderate obese, alert and oriented, able to relate his history without difficulty.  HEENT:  Pupils are equal, round and reactive to light.  NECK:  Without jugular venous distention or bruits.  CHEST:  Lungs are clear, but with distant breath sounds.  There is no active wheezing.  CARDIAC:  Normal S1, S2 with an irregular rhythm.  ABDOMEN:  Moderately obese without palpable masses, though because of his obesity palpation of the aorta was not possible.  NEUROLOGIC:  Grossly intact.  The patient has no palpable lymph nodes in the cervical or supraclavicular area.  SKIN:  Non-healing ulcer on the left heel.  He also has healed scars on both lower extremities.  VASCULAR:  No distal palpable pulses.  LABORATORIES:  Hematocrit 50, platelet count 196,000.  PT 11.4.  Sodium 136, potassium 5.2, BUN 17, creatinine 1.1, glucose 197.  EKG shows atrial fibrillation.  It is unknown how long he has had atrial fibrillation.  Chest x-ray shows evidence of COPD and possible early lower lobe pulmonary fibrosis.  Cardiac catheterization films are reviewed. Overall, the patient has diffuse coronary artery disease with very poor quality distal vessels.  LAD has 80-90% proximal lesion and a diagonal branch also with significant lesion.  The circumflex is dominant with four OM branches.  All are very small.  After the takeoff of the first one there is significant disease throughout the circumflex.  The distal vessels of the posterior descending which rise from the circumflex are of poor quality. Patient has severe inferior hypokinesis, overall decreased global ejection fraction.  IMPRESSION: 1. Patient with significant three vessel coronary artery disease in a setting     of severe poorly controlled diabetes. 2. Atrial  fibrillation, probably chronic, but of unknown duration.  Has not    been anticoagulated. 3. Severe peripheral vascular disease with non-healing ulcer. 4. Long-term smoking history with chronic obstructive pulmonary disease and    question of early pulmonary fibrosis on chest x-ray.  PLAN:  Because of the patients significant three vessel disease in the face of needing revascularization of his lower extremities, coronary artery bypass grafting has been recommended.  In addition, at the time of surgery a transesophageal echocardiogram probe will be placed to evaluate the left atrial appendage for clot and consideration of ligation of the left atrial appendage because of his unknown history of atrial fibrillation.  The risks of the procedure including death, infection, stroke, myocardial infarction, bleeding, blood transfusion have all been discussed in detail with the patient.  In addition, it has been discussed with him that he is at increased risk of surgery because of his numerous medical problems and poor distal vessels.  The patient is agreeable and willing to proceed.  His questions have been answered. Dictated by:   Gwenith Daily Tyrone Sage, M.D. Attending Physician:  Eleanora Neighbor DD:  09/15/01 TD:  09/15/01 Job: [id number]

## 2011-03-14 NOTE — Discharge Summary (Signed)
[place]. [facility]  Patient:    [name], [name] Visit Number: [id number] MRN: [id number]          Service Type: SUR Location: 2000 2021 01 Attending Physician:  Colvin Caroli Dictated by:   Tollie Pizza Collins, P.A.-C. Admit Date:  01/17/2002 Disc. Date: 01/18/02   CC:         Colleen Can. Deborah Chalk, M.D.  Tera Mater. Evlyn Kanner, M.D.   Discharge Summary  DISCHARGE DIAGNOSES:  1. Lymphocele, left thigh.  2. History of peripheral vascular disease, status post left common femoral to     above knee popliteal bypass with Gore-Tex graft on December 01, 2001.  3. Coronary artery disease, status post coronary artery bypass graft in     November 2002.  4. History of atrial fibrillation on chronic Coumadin therapy.  5. Type 2, non-insulin-dependent diabetes.  6. Chronic obstructive pulmonary disease.  7. Hypothyroidism.  8. History of peripheral neuropathy.  9. Hiatal hernia. 10. Hypercholesterolemia.  PROCEDURE:  Incision and drainage of left thigh lymphocele.  HISTORY OF PRESENT ILLNESS:  The patient is a 65 year old, white male with a history of peripheral vascular disease who is status post a left common femoral to above knee popliteal bypass using 6 mm Gore-Tex graft performed by Dr. Hart Rochester on December 01, 2001.  He presented at this time with persistent drainage from his left thigh incision.  He has been seen multiple times in the office and evaluated for this problem.  He comes in today with what appears to be a large lymphocele which is painful and limiting his ability to ambulate. It is felt that at this time he should be admitted and undergo incision and drainage of the area.  HOSPITAL COURSE:  He was admitted on January 17, 2002.  He was taken to the operating room where he underwent incision and drainage of the left thigh wound.  Intraoperative cultures were obtained and Al Pimple drain was placed.  Postoperatively, he has done well.  His  drain initially produced a small amount of serosanguineous drainage, however, this has slowed and at this time he is having minimal drainage.  The drain has been removed.  The thigh incision is healing well with no erythema or edema.  He is ambulating without difficulty.  He has remained afebrile and all vital signs have been stable. Cultures thus far have shown no growth.  It is felt that he can be discharged home at this time with further management to be done as an outpatient.  DISCHARGE MEDICATIONS: 1. Tylox one to two q.4h. p.r.n. pain. 2. Keflex 500 mg t.i.d. x1 month. 3. Lopressor 50 mg 1/2 tablet q.d. 4. Cordarone 200 mg b.i.d. 5. Glucovance 5/500 two tablets q.d. 6. Lotensin 10 mg q.d. 7. Synthroid 0.5 mcg q.d. 8. Enteric coated aspirin 325 mg q.d. 9. Multivitamin q.d.  ACTIVITY:  He is to refrain from driving, heavy lifting or strenuous activity. He should continue daily walking as tolerated.  DIET:  He is asked to continue the same preoperative diet.  SPECIAL INSTRUCTIONS:  He may shower daily and clean his incisions with soap and water.  He is asked to discontinue use of his Coumadin until his visit with Dr. Hart Rochester and will be reevaluated at that time.  He should call our office if he experiences any problems or has difficulties in the interim.  FOLLOWUP:  He will be seen in the [facility] office in two to three weeks by Dr. Hart Rochester and have sutures  removed at that time. Dictated by:   Tollie Pizza Collins, P.A.-C. Attending Physician:  Colvin Caroli DD:  01/19/02 TD:  01/19/02 Job: [id number]

## 2011-03-14 NOTE — H&P (Signed)
[place]. [facility]  Patient:    [name], [name] Visit Number: [id number] MRN: [id number]          Service Type: SUR Location: 3300 3303 01 Attending Physician:  [name] Dictated by:   [name], P.A. Admit Date:  12/01/2001 Discharge Date: 12/03/2001   CC:         [name], M.D.  [name], M.D.  [name], M.D.   History and Physical  DATE OF BIRTH:  2046/10/20  PRIMARY CARE PHYSICIAN:  [name], M.D.  CARDIOLOGIST:  [name], M.D.  CARDIOVASCULAR SURGEON:  Manuel Daily. Tyrone Abbott, M.D.  CHIEF COMPLAINT:  Draining lymphocele, left leg wound.  HISTORY OF PRESENT ILLNESS:  This is a 65 year old white male who is status post left lower extremity bypass graft by Manuel Skye. Hart Abbott, M.D., on December 01, 2001.  Since then, he has had persistent drainage from his left thigh wound near the knee.  He has had serosanguineous drainage that has required several visits to the office for aspiration.  [name], M.D., now feels that drainage in the operating room is the best treatment.  This is discussed with the patient and he agreed to proceed.  It is scheduled for January 17, 2002.  PAST MEDICAL HISTORY:  1. Nonhealing left heel ulcer, now healed.  2. Draining left thigh wound for the last month.  3. Coronary artery disease with previous CABG, decreased left ventricular     function, and decreased EF.  4. Chronic atrial fibrillation with Coumadin therapy.  5. Severe peripheral vascular occlusive disease with a previous     revascularization surgery.  6. Non-insulin-dependent diabetes mellitus for 20 years.  7. COPD.  8. Hypothyroidism.  9. Peripheral neuropathy. 10. Hiatal hernia. 11. Hypercholesterolemia.  He denies a history of cancer, MI, or stroke.  PAST SURGICAL HISTORY: 1. CABG x 4 in November of 2002 by [name], M.D. 2. Left common femoral to above the knee popliteal  bypass graft with a 6 mm    Gore-Tex by Manuel Skye. Hart Abbott, M.D., on December 01, 2001. 3. Knee surgery. 4. Left thumb surgery as a child.  ABIs on December 21, 2001, were greater than 1.0 bilaterally.  MEDICATIONS: 1. Coumadin 5 mg tablet daily.  Coumadin was discontinued yesterday on    January 11, 2002. 2. Metoprolol 50 mg one-half tablet b.i.d. 3. Amiodarone 200 mg b.i.d. 4. GlucoVance 5/500 mg two tablets daily. 5. Lotensin 10 mg daily. 6. Synthroid 0.05 mg daily. 7. Centrum multivitamins daily. 8. Enteric-coated aspirin daily.  ALLERGIES:  No known drug allergies.  REVIEW OF SYSTEMS:  He has had erythema and serosanguineous drainage from his thigh wound.  He denies fever, chills, and sweats.  He denies cough and shortness of breath.  He denies chest pain and unusual palpitations.  He denies GI or GU complaints.  He denies a history of stroke.  FAMILY HISTORY:  His mother is living at age 80.  His father is deceased at age 71 secondary to ETOH.  He has two sisters who are healthy.  He has a history of diabetes in his family.  SOCIAL HISTORY:  He is a widower for the last two-and-a-half years.  He has two children who live nearby and are very supportive.  He is retired from a Theatre stage manager.  He quit smoking after two to three packs a day for 45 years in November of 2002.  He denies alcohol use.  He is socially active and tends to his own affairs.  He is up and about during the day.  He drives a car.  He is here today by himself.  PHYSICAL EXAMINATION:  Blood pressure 110/60, heart rate 66 and slightly irregular, respirations 18 and nonlabored.  GENERAL APPEARANCE:  A well-developed, well-nourished, moderately obese, white male in no acute distress.  Alert and oriented x 3.  HEENT:  Normocephalic and atraumatic.  Pupils are equal, round, and reactive to light.  Extraocular movements are intact.  Visual acuity is intact OU.  NECK:  Supple with no JVD or bruits.  There is no  adenopathy.  CHEST:  Symmetrical with clear and distant breath sounds equally anterior and posterior.  CARDIAC:  Slightly irregular S1 and S2 with no murmurs, rubs, or gallops.  ABDOMEN:  Soft, protuberant, and nontender with positive bowel sounds.  No masses or bruits felt or heard.  EXTREMITIES:  On the left lower extremity he has approximately a 9 cm thigh incision at the level of the knee.  The suture line is intact.  There is some slight erythema around the incision.  There is some clear drainage that can be expressed.  The area around the incision is slightly fluctuant.  In the left groin there is a healed groin wound with no drainage.  The suture line is intact.  The left lower extremity has no palpable pulses, but the foot is warm and appears well perfused.  There are no active ulcerations in the left lower extremity.  There is an old healed left heel ulcer and it looks like it is healed up at this time.  There is pitting edema in the bilateral lower extremities.  He does walk with a slight limp attributed to pain in the left lower extremity.  VASCULAR:  Carotid pulses are 2+ bilaterally.  There are no palpable pulses in the lower extremities, but they are arm and well perfused.  It is difficult to feel radial pulses bilaterally, but he has bilateral brachial pulses.  NEUROLOGIC:  Nonfocal throughout.  Gait is steady, but with a slight limp. Muscular strength is +5/5 throughout.  ASSESSMENT AND PLAN:  This is a 65 year old white male, status post left lower extremity bypass graft with a draining lymphocele under one of the surgical incisions.  This has required several visits for drainage.  [name], M.D., now feels that it is best to drain this in the OR.  This has been  explained to the patient and he agreed to proceed.  It is scheduled for January 17, 2002.  [name] is medically stable and has no new complaints at this time.  He is stable for surgery.  His  Coumadin has been discontinued. Dictated by:   [name], P.A. Attending Physician:  [name] DD:  01/12/02 TD:  01/13/02 Job: [id number]

## 2011-03-14 NOTE — Cardiovascular Report (Signed)
[place]. [facility]  Patient:    [name], [name] Visit Number: [id number] MRN: [id number]          Service Type: CAT Location: 3700 3707 01 Attending Physician:  Eleanora Neighbor Dictated by:   Colleen Can. Deborah Chalk, M.D. Proc. Date: 09/14/01 Admit Date:  09/14/2001   CC:         Quita Skye. Hart Rochester, M.D.  Tera Mater. Evlyn Kanner, M.D.   Cardiac Catheterization  HISTORY:  [name] has peripheral vascular disease and had an abnormal adenosine Cardiolite study.  He is referred for evaluation.  He has diabetes as well as ongoing cigarette abuse.  PROCEDURE:  Left heart catheterization with selective coronary angiography and left ventricular angiography.  CARDIOLOGIST:  Colleen Can. Deborah Chalk, M.D.  TYPE AND SITE OF ENTRY:  Percutaneous right femoral artery.  CATHETERS:  6-French 4 curved Judkins right and left coronary catheters, 6-French pigtail ventricular guiding catheter, 6-French 3.5 right coronary catheter.  CONTRAST MATERIAL:  Omnipaque.  MEDICATIONS GIVEN PRIOR TO PROCEDURE:  Valium 10 mg p.o.  MEDICATIONS GIVEN DURING PROCEDURE:  Versed 2 mg IV, fentanyl 25 mcg IV.  COMMENTS:  The patient tolerated the procedure well.  HEMODYNAMIC DATA:  The aortic pressure was 108/65.  LV was 108/13.  There was no aortic valve gradient noted on pullback.  ANGIOGRAPHIC DATA: 1. Left main coronary artery is normal. 2. The left anterior descending: The left anterior descending was a reasonably    large vessel that extended to and across the apex.  Proximally, there was    extensive calcification as well as complex plaque.  There was an 80 to 90%    plaque before the first septal perforating branch and first diagonal    vessel.  It would appear that the first diagonal vessel would be suitable    for grafting.  There was somewhat diffuse disease in the main portion of    the left anterior descending, most likely in the 30 to 50% range.    However, the distal  vessel would be suitable for grafting. 3. Left circumflex:  The left circumflex is a reasonably large dominant    vessel.  There is diffuse disease.  There are four different branches of    the left circumflex that would be suitable for grafting.  There is a 70%    stenosis in the mid portion.  There is somewhat diffuse disease, but the    four marginal branches probably would be bypassable, but the posterior    descending vessel is quite severely diseased distally. 4. 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    It has diffuse 30 to 50% narrowings.  It is a small vessel that is    approximately 1 mm in diameter and would not be bypassable.  LEFT VENTRICULAR ANGIOGRAM:  The left ventricular angiogram was performed in the RAO position.  Overall cardiac size and silhouette are normal.  The global ejection fraction is approximately 40%.  He has inferior hypokinesis.  OVERALL IMPRESSION: 1. Mild to moderate left ventricular dysfunction. 2. Severe three-vessel coronary disease with a nondominant right coronary    artery and severe disease in the left anterior descending and left    circumflex.  DISCUSSION:  In light of these findings, the patient will be referred for coronary artery bypass grafting. Dictated by:   Colleen Can Deborah Chalk, M.D. Attending Physician:  Eleanora Neighbor DD:  09/14/01 TD:  09/14/01 Job: [id number]

## 2011-03-14 NOTE — H&P (Signed)
NAME:  [name], [name]                         ACCOUNT NO.:  [id number]   MEDICAL RECORD NO.:  [id number]                   PATIENT TYPE:  INP   LOCATION:                                       FACILITY:  [facility]   PHYSICIAN:  Quita Skye. Hart Rochester, M.D.               DATE OF BIRTH:  06/29/1946   DATE OF ADMISSION:  12/29/2002  DATE OF DISCHARGE:                                HISTORY & PHYSICAL   REFERRING PHYSICIANS:  Tera Mater. Evlyn Kanner, M.D. and Colleen Can. Deborah Chalk, M.D.   DATE OF ARTERIOGRAM:  12/28/2002.   DATE OF PLANNED SURGERY:  12/29/2002.   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   BRIEF HISTORY:  The patient is a 65 year old white male with well known  CVTS.  He had had a prior left femoropopliteal bypass graft with  claudication in February 2003 using Gortex.  He has had good results from  that procedure and now presents with a new nonhealing ulcer right first  great toe which is 1 cm x 0.5 cm in diameter, plantar surface.  The patient  has also complained of claudication with ambulation.  He has chronic  shortness of breath with exertion, but says currently his leg starts hurting  him before he gets short of breath.  He has been evaluated by Dr. Hart Rochester.  Doppler studies have been obtained.  It was his impression that the patient  had femoropopliteal occlusive disease.  He plans to admit the patient for  arteriography on 12/28/2002.  The arteriogram will be as an outpatient then  return on 12/29/2002 for surgery.   PAST MEDICAL HISTORY:  1. He has chronic atrial fibrillation.  2. Coronary artery disease, status post CABG 11/02.  He had a decreased     ejection fraction between 30 and 40% at that time.  3. Adult onset diabetes mellitus x20 years with a history of peripheral     neuropathy.  He is now insulin-dependent.  4. COPD.  5. Hypothyroidism.  6. History of hiatal hernia.  7. History of  dyslipidemia.   PAST SURGICAL HISTORY:  1. He had a left femoropopliteal bypass graft, above the knee, with Gortex     12/01/2001.  2. Abdominal arteriogram with bilateral lower extremity runoff 08/2001.  3. I&D of the left thigh lymphocele on 01/17/2002.  4. He is status post coronary artery bypass grafting x4 in 08/2001 by Dr.     Sheliah Plane.   CURRENT MEDICATIONS:  1. Zocor 80 mg 1/2 tablet daily.  2. Avandia 8 mg 1/2 tablet daily.  3. Gemfibrozil 600 mg b.i.d.  4. Lopressor 50 mg b.i.d.  5. Lantus 15 units subcu q.a.m. a.c.  6. Lotensin/HCTZ 10/12.5 mg daily.  7. Amaryl 4 mg 1/2 tablet daily.  8. Synthroid 0.05 mg daily.  9. Coumadin 5 mg tablets 1/2 tablet daily.  He  is to discontinue his     Coumadin on 12/28/2002.   ALLERGIES:  None known.   REVIEW OF SYSTEMS:  He has had a 30-pound weight gain since his surgery for  coronary artery bypass grafting.  He has no history of kidney disease or  asthma.  He has COPD.  He has no history of TIAs, CVAs, or syncopal  episodes.  He has coronary artery disease with chronic atrial fibrillation.  No history of MI.  No history of pulmonary embolus or DVT.  No history of GI  bleeding.  He does have some reflux.  GU: Negative.  No dysuria or  hematuria.  He has shortness of breath with activity.  He denies orthopnea,  PND, or chronic cough.   FAMILY HISTORY:  His mother is living at age 27.  Father died at age 72 with  what sounds like esophageal varices.  He has 2 sisters both older in fairly  good health, but one is morbidly obese.   SOCIAL HISTORY:  He is widowed.  He uses alcohol occasionally.  He has 2  sons.  The oldest had a brain tumor removed successfully.  He smoked 2-3  packs per day for 40+ years.  He quit the day of his coronary artery bypass  graft in 08/2001.   PHYSICAL EXAMINATION:  GENERAL:  This is a well-nourished, well-developed,  alert, oriented, white male in no acute distress.  VITAL SIGNS:  He is  markedly overweight.  HEAD:  Normocephalic.  EYES:  PERLA.  EOM is intact. Fundi are not visualized.  EARS, NOSE, MOUTH, AND THROAT:  Grossly within normal limits.  NECK:  No lymphadenopathy.  No JVD.  No bruits.  CHEST:  Clear to auscultation and percussion.  No wheezes, rales or rhonchi.  No palpable lymphadenopathy.  Respirations were unlabored.  He had a well-  healed sternotomy incision.  CARDIAC:  Rhythm is somewhat irregular.  No murmurs, rubs, or gallops noted.  ABDOMEN:  Soft, nontender.  Positive bowel sounds.  No hepatosplenomegaly.  No palpable masses or bruits.  He is morbidly obese.  GENITOURINARY/RECTAL:  Deferred.  EXTREMITIES:  No clubbing or cyanosis.  He has +1 edema in both lower  extremities.  He has an ulceration of the right great toe at the plantar  surface which is 1/0.5 cm rather oblong in shape.  He has no hair on his  lower extremities.  Pulses are +2 in the carotids.  Femorals are +1.  Popliteal, DP and PT are +1 on the left, not palpated on the right.  NEUROLOGIC:  Exam was grossly within normal limits.   IMPRESSION:  1. 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  2. Status post left femoropopliteal bypass graft with Gortex 11/2001 by Dr.     Hart Rochester.  3. Coronary artery disease status post coronary artery bypass graft x4 in     08/2001 by Dr. Tyrone Sage.  4. Adult onset diabetes mellitus type 2 x20 years now insulin-dependent.  5. Hypothyroidism.  6. Dyslipidemia.  7. Hypertension.  8. Peripheral neuropathy.    PLAN:  The patient is admitted for arteriogram on 12/28/2002 as an  outpatient to be followed by admission on 12/29/2002 for revascularization  if possible.     Eber Hong, P.A.                 Quita Skye Hart Rochester, M.D.    WDJ/MEDQ  D:  12/27/2002  T:  12/27/2002  Job:  [id number]

## 2011-03-14 NOTE — Consult Note (Signed)
[name]  Patient:    [name], [name] Visit Number: [id number] MRN: [id number]          Service Type: SUR Location: [id number] Attending Physician:  Colvin Caroli Dictated by:   Clovis Pu Patty Sermons, M.D. Proc. Date: 12/02/01 Admit Date:  12/01/2001   CC:         Quita Skye. Hart Rochester, M.D.  Tera Mater. Evlyn Kanner, M.D.  Colleen Can. Deborah Chalk, M.D.   Consultation Report  CHIEF COMPLAINT:  Rapid heart action.  HISTORY:  This is a 65 year old Caucasian male diabetic who underwent peripheral vascular surgery on December 01, 2001 by Dr. Hart Rochester.  Patient has been running a rapid heart rate postoperatively.  The patient has a history of known coronary artery disease.  In November 2002 he was found to have significant coronary atherosclerotic heart disease and underwent coronary artery bypass graft surgery.  The patient was in atrial fibrillation preoperatively and was also in atrial fibrillation for most of the postoperative day except immediately postoperative when he had a brief spell of normal sinus rhythm.  Postoperatively from his bypass surgery, therefore, he was placed on Coumadin and was given amiodarone and Toprol and was sent out in atrial fibrillation.  Subsequently, the Toprol was discontinued as an outpatient and he has continued on amiodarone and Coumadin.  He has not been experiencing any recurrence of angina.  He is not having any symptoms of congestive heart failure, but does have exertional dyspnea which he attributes to lifelong cigarette smoking.  MEDICATIONS: 1. Lotensin 10 mg daily. 2. Synthroid 50 mcg daily. 3. Cordarone 200 mg b.i.d. 4. Multivitamins daily. 5. Folic acid daily. 6. Coumadin ordered daily. 7. Sliding scale insulin. 8. Glucotrol.  FAMILY HISTORY:  His father died at age 30 and his mother is living at age 67, but does have cardiomegaly.  He has two living sisters in good health.  SOCIAL HISTORY:  He is  widowed and has two children.  He has his own wrecking service.  Prior to his coronary artery bypass graft surgery he had occasional mixed drinks on the weekends, but no daily drinking.  Since his bypass he has had only two mixed drinks in the past three months.  He was smoking two to three packs of cigarettes a day for 45 years, but quit in November at the time of his bypass surgery.  REVIEW OF SYSTEMS:  He had a poor appetite initially, but now has regained his appetite and his weight has begun to increase again.  He is not having any abdominal pain, change in bowel habits, hematochezia, or melena.  He is not having any urinary tract symptoms.  He denies any purulent sputum.  Remainder of review of systems is negative.  He has not had any thromboembolic event to suggest systemic emboli from his left atrium.  PHYSICAL EXAMINATION  VITAL SIGNS:  Blood pressure 132/65, pulse 118 and irregularly irregular, respirations normal, temperature 97.  GENERAL:  Well-developed, well-nourished gentleman sitting up in a lounge chair.  He is in no acute distress.  Skin warm and dry.  He is alert and cooperative.  HEENT:  Pupils are equal, round, and reactive to light and accommodation. Extraocular movements are full.  Sclerae:  Not icteric.  Mouth and pharynx: Normal.  NECK:  Jugular venous pressure normal.  Carotids:  No bruits.  Thyroid: Normal.  No lymphadenopathy.  CHEST:  Few rhonchi at both bases.  HEART:  No murmur, gallop, rub, or click.  ABDOMEN:  Soft and nontender.  Liver and spleen are not enlarged or tender.  EXTREMITIES:  No phlebitis.  LABORATORIES:  Postoperative EKG is pending.  Rhythm strips reveal that he has been in atrial fibrillation and occasionally relatively regular response suggesting atrial flutter.  We have requested a 12-lead EKG.  IMPRESSION: 1. Chronic atrial fibrillation with poorly controlled rate at present. 2. Diabetes mellitus. 3. Prior tobacco abuse  until November 2002. 4. Peripheral vascular disease.  RECOMMENDATION:  We will continue present amiodarone 200 mg b.i.d. for now. He has been on amiodarone for three months and should be suitably loaded and I do not think we will need to increase the maintenance dose any further at this point.  We are going to continue to recommend long-term Coumadin for as long as he is in atrial fibrillation.  Will add low dose beta blocker for rate control.  Dr. Deborah Chalk will see the patient on February 7. Dictated by:   Clovis Pu Patty Sermons, M.D. Attending Physician:  Colvin Caroli DD:  12/02/01 TD:  12/02/01 Job: [id number]

## 2011-03-14 NOTE — Discharge Summary (Signed)
NAMESHAHAN, STARKS               ACCOUNT NO.:  [id number]   MEDICAL RECORD NO.:  [id number]          PATIENT TYPE:  INP   LOCATION:  3020                         FACILITY:  [facility]   PHYSICIAN:  [name]. Fields, MD  DATE OF BIRTH:  05-Feb-1946   DATE OF ADMISSION:  03/05/2005  DATE OF DISCHARGE:  03/09/2005                                 DISCHARGE SUMMARY   ADMISSION DIAGNOSIS:  Cellulitis of the right lower extremity.   PAST MEDICAL HISTORY AND DISCHARGE DIAGNOSES:  1.  Coronary artery disease status post coronary artery bypass grafting by      Dr. Tyrone Sage November 2002.  2.  History of insulin-dependent diabetes mellitus.  3.  Chronic obstructive pulmonary disease with ongoing tobacco abuse.  4.  Hypothyroidism.  5.  Dyslipidemia.  6.  Hypertension.  7.  Atrial fibrillation.  8.  Multiple revascularization procedures secondary to peripheral vascular      occlusive disease.  9.  Cellulitis of the right lower extremity, resolved with IV antibiotics.   ALLERGIES:  NO KNOWN DRUG ALLERGIES.   BRIEF HISTORY:  The patient is a 65 year old male with peripheral vascular  disease who has undergone multiple revascularization  procedures.  The last  procedure performed was in September 2004, at which time his femoral to  popliteal bypass graft had occluded.  He then underwent a right femoral  endarterectomy with revision of the proximal anastomosis and patch  angioplasty of his distal anastomosis.  He presented to the office on January 24, 2005 with increasing pain in the right foot which was ischemia again.  This had been going on for approximately 3-5 days.  On January 29, 2005 the  patient underwent thrombectomy revision of the proximal anastomosis of the  right fem-pop bypass graft by Dr. Darrick Penna.  Subsequent to that, he developed  cellulitis of the right lower extremity several days prior to admission, and  failed to respond to p.o. Levaquin which the patient stated that he had been  taking.  The patient had represented to the CVTS office on Mar 05, 2005 with  significant cellulitis of the right lower extremity.  Dr. Edilia Bo evaluated  the patient in the office and it was his opinion that the patient should be  admitted for IV antibiotic treatment of his right lower extremity  cellulitis.   HOSPITAL COURSE:  The patient was seen by Dr. Edilia Bo and admitted on Mar 05, 2005 for IV Ancef antibiotic treatment of his right lower extremity  cellulitis.  The patients hospital course has progressed as expected and his  cellulitis is resolving.  His pain is decreased, and he is ambulating.  On  Mar 09, 2005, he is afebrile with stable vital signs, and ambulating well.  The cellulitis is significantly better with decreased pain.  The patient is  in stable condition at this time and as long as he continues to progress in  the current manner, should be ready for discharge in the next 1-2 days  pending morning round reevaluation.   MEDICATIONS:  1.  Keflex 500 mg t.i.d. x14 days.  2.  Coumadin 5 mg Monday, Wednesday, Friday and Saturday, and 2.5 mg      Tuesday, Thursday and Sunday.  3.  Lasix 20 mg daily.  4.  Gemfibrozil 600 mg b.i.d.  5.  VYTORIN 10/40 daily.  6.  Glyburide 4 mg 1/2 tablet daily.  7.  Metoprolol 50 mg daily.  8.  Avandia 4 mg daily.  9.  Synthroid 50 mcg daily.  10. Tylox 1-2 q.4-6h. p.r.n. pain.   ACTIVITY:  The patient should continue walking exercises.   DIET:  Low salt, low fat and diabetic modified.   WOUND CARE:  The patient should clean the site daily with soap and water.  If wound problems arise, the patient should contact the CVTS office at [phone].   FOLLOWUP APPOINTMENT:  Dr. Darrick Penna two weeks after discharge.  The patient  will be contacted by the CVTS office with the date and time of this  appointment.      AY/MEDQ  D:  03/09/2005  T:  03/10/2005  Job:  [id number]

## 2011-03-14 NOTE — Op Note (Signed)
[name], [name]               ACCOUNT NO.:  [id number]   MEDICAL RECORD NO.:  [id number]          PATIENT TYPE:  INP   LOCATION:  2550                         FACILITY:  [facility]   PHYSICIAN:  [name]. Fields, MD  DATE OF BIRTH:  1946-01-25   DATE OF PROCEDURE:  02/06/2006  DATE OF DISCHARGE:                                 OPERATIVE REPORT   PROCEDURE:  1.  Thrombectomy of right femoral to below knee popliteal bypass.  2.  Right axillary to femoral bypass.  3.  Interoperative arteriogram x 1.   PREOPERATIVE DIAGNOSIS:  Ischemia, right lower extremity.   POSTOPERATIVE DIAGNOSIS:  Ischemia, right lower extremity.   ANESTHESIA:  General.   SURGEON:  Charles E. Fields, M.D.   ASSISTANT:  Pecola Leisure, P.A.-C.   INDICATIONS:  The patient is a 65 year old male with history of a right  femoral to below-knee popliteal bypass with multiple prior revisions.  He  presented to the office today with a three week history of pain in his right  foot.  He had a recent fracture of his right foot.  His foot was cool and  dusky on appearance.  He was noted on duplex ultrasound to have an occluded  femoral to popliteal bypass.   OPERATIVE FINDINGS:  1.  Occluded right femoral to popliteal bypass graft.  2.  Single vessel runoff via anterior tibial artery.  3.  6-mm PTFE axillary to right femoral bypass.   OPERATIVE DETAILS:  After obtaining informed consent, the patient was taken  to the operating room.  The patient was placed in the supine position on the  operating table.  After induction of general anesthesia and endotracheal  intubation, a Foley catheter was placed.  Next, the patient's entire right  lower extremity was prepped and draped in the usual sterile fashion.  A  longitudinal incision was made through a preexisting scar on the medial  aspect of the leg below the knee.  The incision was carried down through  subcutaneous tissues and the fascia was incised.  The popliteal  space was  entered.  The old femoral to popliteal Gore-Tex graft was dissected free  circumferentially.  The popliteal artery proximal to and distal to the  anastomosis was also dissected free circumferentially.  The popliteal vein  was adherent to the graft and the popliteal artery.  Several repairs were  made to the popliteal vein using interrupted 5-0 Prolene suture during  dissection.   Next, the patient was given 5000 units of intravenous heparin.  A transverse  graftotomy was made in the femoral to popliteal bypass just above the  anastomosis.  Number 3 and number 4 Fogarty catheters were passed through  the distal anastomosis with return of some thrombus and then good back  bleeding.  This was then clamped distally on the popliteal artery with a  fine bulldog clamp.  At this point multiple passes were made with a number 3  and number 4 Fogarty catheters through the proximal end of the graft but  this would not pass through the proximal anastomosis.  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  It was decided, at this point, that the primary  problem with his graft was probably inflow in nature.  Since multiple  bypasses had previously been done in the left groin for previous bypass and  the patient was not a candidate for an emergent inflow procedure based on  the aorta, it was decided to do a right axillary femoral bypass for inflow.  At this point, the remainder of the abdomen and right side of the chest were  prepped and draped in the usual sterile fashion.  An oblique incision was  made just below the infraclavicular area on the right side.  An incision was  made in this area and carried down through subcutaneous tissues down to the  level of the pectoralis muscle.  The muscle fibers were separated along the  line of their course.  Dissection was carried down to the level of the  axillary vein.   This was reflected superiorly.  The axillary artery was then  dissected free just inferior to this.  This was elevated up on the operative  field and approximately 3 cm dissected free for use in grafting.  A moist  laparotomy pad was then placed in the incision.  Attention was then turned  to the right groin.  A longitudinal incision was made in the right groin of  the femoral artery over a preexisting scar.  The patient was obese and the  wound was quite deep.  Additionally, there was dense adherent scar tissue  down to the level of femoral artery.  The preexisting proximal anastomosis  of the Gore-Tex graft was dissected free circumferentially.  Dissection was  then carried up on the hood of the proximal anastomosis.  Scar tissue was  quite adherent around the artery at this location and it was unsafe to  dissect further around this.  At this point, a small graftotomy was made  just distal to the anastomosis.  There was some thrombotic material removed  from the proximal anastomosis.  There was some blood flow through this but  it was fairly poor in quality.  At this point, the entire hood of the graft  was opened.  The external iliac artery was controlled with a Fogarty balloon  and pressure occlusion.  The profunda femoris was controlled with a number 4  Fogarty catheter.  There was some adherent thrombus within the lumen of the  hood of the graft.  This was removed.  A 6 mm PTFE graft was then brought up  on the operative field and sewn end-to-side to the anterior surface of the  hood of the fem-pop graft and a portion of the wall of the native common  femoral artery.  The fem-pop was then reanastomosed just below the hood of  the axillofemoral bypass using a running 5-0 Prolene suture on the 6 mm  graft and a 5-0 Prolene on the redo of the distal segment.  that was  reattached on the fem-pop site.  The 6 mm PTFE graft was then tunneled retrograde from the right groin up to the right  axillary artery.  The  patient had been given an additional 5000 units of heparin during this  portion of the case.  The axillary artery was clamped proximally and  distally with peripheral DeBakey clamps.  A longitudinal arteriotomy was  made on the anteroinferior surface of the axillary artery.  The 6 mm PTFE  graft was then sewn end of graft to side of artery using running 6-0 Prolene  suture.  Just prior to completion of the anastomosis, this was fore bled and  back bled and thoroughly flushed.  The anastomosis was secured, clamps  released, it was found to be hemostatic.  There was good flow through the  axillary femoral bypass at this point.  The right groin and right below the  knee incision were inspected and also found to be hemostatic.  An  arteriogram had been performed through the below the knee segment of the  graft prior to opening the axillary incision and this showed a single vessel  runoff via the anterior tibial artery.  After the axillofemoral graft had  been opened, there was a good dorsalis pedis Doppler signal.  The foot also  became pink.  The patient was given 30 mL of IV contrast for the angiogram.  Since the patient had a good Doppler signal at this point, the angiogram was  not repeated due to the patient's baseline renal insufficiency.  After  hemostasis obtained in all three incisions, the axillary incision was closed  with a 2-0 Vicryl suture in the fascia and subcutaneous tissue, then a  running 3-0 Vicryl suture, and the skin closed with staples.  The groin was  closed in three layers with multiple layers of 2-0 Vicryl suture.  The skin  was closed with staples.  The below knee incision was then closed with  running 2-0 Vicryl suture in the fascia, a running 3-0 Vicryl suture in the  subcutaneous tissues, and the skin closed with staples.  The patient began  to have some rapid atrial fibrillation during the later portion of the case.  He was started on some  low dose dopamine in order to help with his blood  pressure.  The patient was taken to the recovery room in stable condition.          ______________________________  [name] Fields, MD    CEF/MEDQ  D:  02/07/2006  T:  02/07/2006  Job:  [id number]

## 2011-03-14 NOTE — Discharge Summary (Signed)
NAME:  [name], [name]                         ACCOUNT NO.:  [id number]   MEDICAL RECORD NO.:  [id number]                   PATIENT TYPE:  INP   LOCATION:  5705                                 FACILITY:  [facility]   PHYSICIAN:  [name]. Fields, MD               DATE OF BIRTH:  Jan 25, 1946   DATE OF ADMISSION:  07/05/2003  DATE OF DISCHARGE:  07/07/2003                                 DISCHARGE SUMMARY   HISTORY OF PRESENT ILLNESS:  This is a 65 year old male with a history of a  previous right fempop bypass graft that was found to be occluded and he was  sent to [facility] for attempted thrombolysis.   PAST MEDICAL HISTORY:  1. Chronic atrial fibrillation.  2. Coronary artery disease status post coronary artery bypass grafting in     November of 2002.  3. Adult-onset diabetes mellitus.  4. COPD.  5. Hypothyroidism.  6. Hiatal hernia.  7. Hypercholesterolemia.   PAST SURGICAL HISTORY:  1. Left fempop bypass graft above knee with Gore-Tex in February 2003.  2. Left thigh lymphocele incision and drainage.  3. Coronary artery bypass grafting x4 in November 2002.   MEDICATIONS ON ADMISSION:  1. Zocor 40 mg daily.  2. Avandia 4 mg daily.  3. Gemfibrozil 600 mg b.i.d.  4. Lopressor 50 mg b.i.d.  5. Lantus 15 units subcutaneous q.a.m.  6. Lotensin/HCT 10/12.5 daily.  7. Amaryl 2 mg daily.  8. Synthroid 0.05 mg daily.  9. Coumadin 5 mg daily.   FAMILY HISTORY:  Please see the history and physical done at the time of  admission.   SOCIAL HISTORY:  Please see the history and physical done at the time of  admission.   REVIEW OF SYSTEMS:  Please see the history and physical done at the time of  admission.   PHYSICAL EXAMINATION:  Please see the history and physical done at the time  of admission.   HOSPITAL COURSE:  The patient was admitted and interventional radiology was  consulted and attempted thrombolysis of the graft was done on July 06, 2003.  However, it  was unsuccessful as a guidewire could not be passed.  Due  to this and the fact that the patient was felt to have a stable foot ulcer  with no definite infection, he was felt to be stable for discharge on  July 07, 2003 for follow-up the following Monday in the [facility] office at  which time he would get vein mapping as well as ABI study with plans for  redo femoral popliteal bypass the following week.   DISCHARGE MEDICATIONS:  As preoperatively.   DISCHARGE INSTRUCTIONS:  The patient was instructed to keep a dry dressing  on the right first toe.  Follow-up included [facility] on July 10, 2003 with  a femoral popliteal bypass to be done July 12, 2003 by [name].  Darrick Penna, MD  CONDITION ON DISCHARGE:  Stable.   FINAL DIAGNOSES:  1. Occluded right femoral popliteal bypass graft.  2. As previously listed.      Rowe Clack, P.A.-C.                    [name]. Fields, MD    WEG/MEDQ  D:  08/23/2003  T:  08/23/2003  Job:  [id number]   cc:   Jeannett Senior A. Evlyn Kanner, M.D.  [address]  [place]  [place] [address]  Fax: [phone]   Colleen Can. Deborah Chalk, M.D.  Fax: [phone]

## 2011-03-14 NOTE — Discharge Summary (Signed)
[place]  Patient:    [name], [name] Visit Number: [id number] MRN: [id number]          Service Type: SUR Location: 2000 2036 [id number] Attending Physician:  Waldo Laine Dictated by:   Areta Haber, Rochel Brome. Admit Date:  09/14/2001 Disc. Date: 09/22/01   CC:         Colleen Can. Deborah Chalk, M.D.  Tera Mater. Evlyn Kanner, M.D.   Discharge Summary  HISTORY OF PRESENT ILLNESS:  This is a 65 year old male who was admitted Dr. Deborah Chalk and being referred in for cardiac clearance for a scheduled femoral popliteal bypass.  The patient had a preoperative adenosine Cardiolite which showed ejection fraction reduction with equivocal images.  He was felt to require coronary angiography and was admitted this hospitalization for the procedure.  He has been recently diagnosed with atrial fibrillation.  PAST MEDICAL HISTORY: 1. Ongoing tobacco abuse. 2. Atrial fibrillation of questionable duration. 3. Diabetes mellitus type 2 x 20 years. 4. History of right knee surgery in 1984. 5. History of left thumb surgery in the past. 6. Hospitalization in 1984, for his diabetes.  ALLERGIES:  No known drug allergies.  MEDICATIONS: 1. Diabetic pill.  He was uncertain of the name. 2. Aspirin. 3. Multivitamins.  For family history, social history, review of systems and physical exam, please see the History and Physical done at the time of admission.  HOSPITAL COURSE:  The patient was admitted electively for cardiac catheterization which was done by Dr. Deborah Chalk on September 14, 2001.  Left ventriculogram showed an ejection fraction of 40% with inferior hypokinesis. The right coronary artery was congenitally small with diffuse 30-50% disease. The left main coronary artery was normal.  The LAD had an 80-90% proximal lesion into the first diagonal with diffuse 30-50% lesions throughout.  The left circumflex was dominant with diffuse disease with a 70% stenosis in the mid  portion with four marginal vessels that were noted to be small.  This study was reviewed by Dr. Deborah Chalk and he was felt to be a surgical revascularization candidate.  Consultation was obtained with Dr. Sheliah Plane who evaluated the patient and his studies and agreed with recommendations for coronary artery bypass grafting.  It was felt he would need transesophageal echocardiogram in addition to evaluating his left atrial appendage for clot and possible ligation.  On September 15, 2001, the patient was taken to the cardiac operating room where he underwent coronary artery bypass grafting x 4.  The following grafts were placed:  Left internal mammary artery to the LAD; saphenous vein graft to the diagonal; sequential saphenous vein graft to obtuse marginal-3 and obtuse marginal-4.  Other vessels were felt to be small with diffuse disease and not able to be bypassed.  Transesophageal echocardiogram showed suggestion of left atrial clot and the left atrial explored with atrial appendix occluded.  The patient was taken to the surgical intensive care unit in stable condition.  Postoperatively, the patient has done well.  He initially required inotropic support including Neo-Synephrine, epinephrine and dopamine.  Cardiac indexes were adequate and these pressors were able to be weaned without significant difficulty.  The patient did require significant diabetic manipulation and endocrine consultation was obtained with Dr. Evlyn Kanner who adjusted these. Initially, the patient did have an elevated TSH level and had subsequently been started on Synthroid.  Additionally, during the postoperative course, the patient had some difficulty with postoperative atrial fibrillation and this was managed by Dr. Deborah Chalk.  The patients rate  is now well-controlled, but he remains in atrial fibrillation.  He has been started on Coumadin as well as amiodarone and Cardizem.  Currently, the patient is quite stable.   His incisions healing well without signs of infection.  He does have some moderate drainage from his thigh incision.  He is afebrile.  His laboratory values are felt to be stable.  His most recent hemoglobin and hematocrit were 10 and 30 respectively with white count 20 all day of September 21, 2001.  Electrolytes with BUN and creatinine are also stable.  His diabetic management is felt to be of adequate control on his current regimen.  His pulmonary status has required progressive management including nebulizers, but he is stable from this view point and oxygen has been weaned.  He maintains good saturations on room air.  His Coumadin is being monitored with daily adjustments.  His INR dated September 21, 2001, is 2.4.  Currently, the patient is tolerating cardiac rehabilitation phase I modalities.  He is felt to be tentatively stable for discharge in the morning of September 22, 2001.  DISCHARGE MEDICATIONS:  1. Combivent inhaler two puffs every six hours.  2. Synthroid 50 mcg daily.  3. Amiodarone 200 mg twice a day.  4. Coumadin 2.5 mg daily and as directed by Dr. Deborah Chalk.  The patient will     have an INR tested next Monday after the Thanksgiving holiday.  5. Toprol XL 25 mg daily.  6. Lasix 40 mg daily.  7. Potassium chloride 20 mEq daily.  8. Lantus insulin 30 units subcu q.h.s.  9. Amaryl 4 mg daily. 10. Darvocet-N 100 one every four to six hours p.r.n. as needed for pain.  The patient will receive written instructions regarding medications, activity, diet, wound care and followup.  FOLLOWUP:  Follow up with Dr. Deborah Chalk in three weeks.  Follow up with Dr. Tyrone Sage on December 19, at 11 a.m.  Dr. Evlyn Kanner should see the patient in two weeks.  The patient is instructed to call to arrange these appointments.  CONDITION ON DISCHARGE:  Stable and improved.  DISCHARGE DIAGNOSES: 1. Coronary artery disease.  2. Atrial fibrillation, preoperative and postoperative. 3. 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 4. Tobacco abuse. 5. History of right knee surgery. 6. History of left thumb surgery. 7. Hypothyroidism. 8. Diabetes mellitus, currently requiring insulin. Dictated by:   Areta Haber, Rochel Brome. Attending Physician:  Waldo Laine DD:  09/21/01 TD:  09/22/01 Job: [id number]

## 2011-03-14 NOTE — Discharge Summary (Signed)
NAME:  [name], [name]                         ACCOUNT NO.:  [id number]   MEDICAL RECORD NO.:  [id number]                   PATIENT TYPE:  INP   LOCATION:  2022                                 FACILITY:  [facility]   PHYSICIAN:  Quita Skye. Hart Rochester, M.D.               DATE OF BIRTH:  Jun 28, 1946   DATE OF ADMISSION:  01/21/2003  DATE OF DISCHARGE:  02/03/2003                                 DISCHARGE SUMMARY   ADMISSION DIAGNOSES:  1. Right popliteal space wound infection; status post right femoral to below-     knee popliteal artery bypass graft with 6 mm Gore-Tex on December 29, 2002,     by Dr. Hart Rochester.  2. Incision and drainage left thigh lymphocele January 18, 2003.  3. Coronary artery disease status post coronary artery bypass grafting     November 2002, Dr. Tyrone Sage.  4. Adult onset diabetes mellitus type 2, insulin dependent.  5. Chronic obstructive pulmonary disease with a history of heavy tobacco     use.  6. Dyslipidemia.  7. Hypertension.  8. Peripheral neuropathy.   DISCHARGE DIAGNOSES:  1. Right popliteal space infection with Staph aureus; status post right     femoral to below-knee popliteal artery bypass graft with 6 mm Gore-Tex.  2. Incision and drainage left thigh lymphocele January 18, 2003.  3. Coronary artery disease status post coronary artery bypass grafting     November 2002, Dr. Tyrone Sage.  4. Adult onset diabetes mellitus type 2, insulin dependent.  5. Chronic obstructive pulmonary disease with a history of heavy tobacco     use.  6. Dyslipidemia.  7. Hypertension.  8. Peripheral neuropathy.   PROCEDURES:  1. Incision and drainage right popliteal space January 21, 2003; Dr. Hart Rochester.  2. Debridement and partial closure right popliteal space January 01, 2003; Dr.     Arbie Cookey.   BRIEF HISTORY:  The patient is a 65 year old white male well known to [facility]  who has recently undergone a right femoral to below-knee popliteal artery  bypass graft with 6 mm Gore-Tex for claudication  and nonhealing ulcer of the  right great toe.  He tolerated the procedure well.  He was re-started on  Coumadin and discharged home on December 31, 2002.  He subsequently developed  pain and swelling in the right lower extremity around his surgical incision  and was seen in the office at which time he had no signs of fever or  infection.  Apparently it was drained at that time.  He returned on  Thursday, the day of admission, pain persisted and while in his car the  right lower extremity incision came apart spontaneously draining brown  fluid.  He presented to the emergency room, was seen by Dr. Hart Rochester,  subsequently admitted, and taken to the operating room for incision and  drainage of his incision.   PAST MEDICAL HISTORY:  1. Peripheral vascular disease with right femoral  to below-knee popliteal     bypass graft using 6 mm Gore-Tex on December 29, 2002.  2. History of left femoral-popliteal bypass graft with above-knee Gore-Tex     on December 01, 2001.  3. Incision and drainage of left thigh lymphocele on January 17, 2002.  4. Coronary artery disease with prior coronary artery bypass grafting     November 2002; Dr. Tyrone Sage.  5. Adult onset diabetes mellitus, insulin dependent.  6. Chronic obstructive pulmonary disease with history of heavy tobacco use.  7. Hypothyroidism.  8. Dyslipidemia.  9. History of hiatal hernia.  10.      Hypertension.  11.      Peripheral neuropathy.   SOCIAL HISTORY:  He is a widower.  He previously smoked two to three packs  per day for 40 years and quit in November of 2002.  He occasionally uses  alcohol.  He has two sons who live in the area.   For further history and physical, please see the dictated note.   MEDICATIONS ON ADMISSION:  1. Coumadin 1/2 mg every day.  2. Zocor 40 mg every day.  3. Avandia 4 mg every day.  4. Gemfibrozil 600 mg b.i.d.  5. Lopressor 50 mg b.i.d.  6. Lantus insulin 15 units subcu. daily.  7. Lotensin/HCTZ 10/12.5 one  daily.  8. Amaryl 2 mg every day.  9. Synthroid 0.05 mg every day.   ALLERGIES:  None known.   HOSPITAL COURSE:  The patient was admitted, taken to the operating room, and  underwent incision and drainage of his incision.  Initial labs on admission  showed a creatinine of 2.3.  Other labs were relatively normal.  White count  was elevated to 26,500, hemoglobin was 12, hematocrit of 35, and a platelet  count of 197,000.  Pro time on admission was 27.9 with an INR of 3.2.  This  was on 2.5 mg a day of Coumadin.  The patient was taken to the operating  room, underwent incision and drainage, and open packing to the site.  He  tolerated this well and was transferred to the floor where he underwent  daily packing.  He made good progress.  His creatinine came back down to  normal.  Cultures grew out Staph aureus.  It was methicillin resistant.  His  antibiotics were adjusted appropriately.  He showed good improvement and was  subsequently returned to the OR on February 01, 2003, at which time he underwent  partial closure and debridement of his right leg wound.  He has tolerated  this well.  The following day, he has some serous drainage from the site but  overall it looks good.  He has had no further fever and it was Dr. Bosie Helper  opinion that if he did not have any problems he could go home the following  a.m. on Keflex.  Home Health was arranged to do daily dressing changes and  to help with physical therapy.  The family has discussed this and his  granddaughter who is nearby will also be helping Mr. Hillery at home.  He  has been re-started on his Coumadin.  His INR is still coming up and will  make a final decision on his Coumadin in the a.m. prior to discharge but we  anticipate he will go home on his preadmission 2.5 mg every day.   DISCHARGE MEDICATIONS:  1. Anticipate discharge on Coumadin 2.5 mg every day. 2. His Lantus has been increased to 34 units at h.s.  3. He will go home on  Keflex 500 mg q.6h.  4. Tylox one to two p.o. q.4h. p.r.n.    FOLLOW UP:  He will return to see Dr. Hart Rochester on February 21, 2003, at 10:30.  Pro time will be checked by a Home Health nurse on February 06, 2003, and  called to Dr. Leonette Monarch.   CONDITION ON DISCHARGE:  Improving.     Eber Hong, P.A.                 Quita Skye Hart Rochester, M.D.    WDJ/MEDQ  D:  02/02/2003  T:  02/04/2003  Job:  [id number]

## 2011-03-14 NOTE — Discharge Summary (Signed)
[name], [name]               ACCOUNT NO.:  [id number]   MEDICAL RECORD NO.:  [id number]          PATIENT TYPE:  INP   LOCATION:  5709                         FACILITY:  [facility]   PHYSICIAN:  [name]. Fields, MD  DATE OF BIRTH:  31-May-1946   DATE OF ADMISSION:  09/03/2005  DATE OF DISCHARGE:  09/09/2005                                 DISCHARGE SUMMARY   PRIMARY ADMITTING DIAGNOSIS:  Left lower extremity pain.   DISCHARGE DIAGNOSES:  1.  Ischemic left lower extremity.  2.  Occluded left femoral to popliteal bypass graft.  3.  Peripheral vascular occlusive disease status post bilateral peripheral      revascularization.  4.  Chronic atrial fibrillation on Coumadin.  5.  Coronary artery disease.  6.  Tobacco abuse.  7.  Type 2 diabetes mellitus.  8.  Peripheral neuropathy.  9.  Hypothyroidism.  10. Dyslipidemia.  11. Hypertension.  12. Chronic obstructive pulmonary disease.  13. Status post right second toe amputation.  14. Hiatal hernia.   PROCEDURES PERFORMED:  1.  Aortogram with bilateral lower extremity runoff.  2.  Left femoral to popliteal bypass graft using reverse saphenous vein      graft to left leg.  3.  Intraoperative arteriogram x2.   HISTORY:  The patient is a 65 year old white male with a history of  peripheral vascular occlusive disease who has undergone bilateral fem-pop  bypass in the past. He has had a history of acute onset of left leg pain  which occurred over the weeks preceding this admission. He saw Dr. Edilia Bo  in the office and his exam showed only dopplerable peroneal signal. CT  angiography showed occlusion of his left fem-pop bypass graft. Dr. Edilia Bo  discussed admission for potential thrombolysis versus redo grafting.  However, at that time his INR was 4.7 and his Coumadin was placed on hold  and the patient refused hospitalization at that time with a plan to follow  up with Dr. Darrick Penna in the office the following week. He was seen again  by  Dr. Darrick Penna on August 29, 2005 and it was recommended that he undergo  arteriography with an eye toward stenting of iliac system and a probable  redo fem-pop bypass to his left leg. He is scheduled for outpatient  admission for arteriography on September 03, 2005.   HOSPITAL COURSE:  The patient was admitted on September 03, 2005 and underwent  an arteriogram by Dr. Arbie Cookey. This showed severe occlusive disease in his  left leg. The films were reviewed by Dr. Nedra Hai and Dr. Darrick Penna and it was  agreed that his best course of action would be to proceed with a redo  femoral to popliteal bypass graft. He was taken to the operating room on  September 04, 2005 and underwent a left femoral to popliteal bypass using  reverse saphenous vein graft performed by Dr. Darrick Penna. He tolerated the  procedure well and was started on heparin and Coumadin postoperatively. He  has done well overall postoperatively. His blood sugars have been elevated  and he has been restarted on his home medications in  addition to a  carbohydrate modified diet. His surgical incision sites were healing well.  He has been slowly mobilized and at the time of discharge is ambulating  without problem. On September 09, 2005, his INR is therapeutic at 2.6. At  that point, he was afebrile and all vital signs had remained stable. He was  ambulating without problem and tolerating his diet. His other labs have  remained stable with his most recent labs showing a hemoglobin of 10.7,  hematocrit 31.4, white count 8.4, platelets 235. PT 27.8 INR 2.6, sodium  136, potassium 4.1, BUN 21, creatinine 1.6.   Dr. Darrick Penna evaluated the patient on morning rounds on September 09, 2005 and  felt that since he was ambulating well, his graft was patent and his INR was  therapeutic that he could be discharged home at this time.   DISCHARGE MEDICATIONS:  1.  Amaryl 2 milligrams daily.  2.  Tylox 1-2 q.4h. p.r.n. pain.  3.  Lasix 20 milligrams Monday, Wednesday  and Friday.  4.  Avandia 12 milligrams daily.  5.  Metoprolol 50 milligrams b.i.d.  6.  Benazepril/HCTZ 10/12.5 milligrams daily.  7.  Vytorin 10/40 milligrams daily.  8.  Gemfibrozil 600 milligrams b.i.d.  9.  Synthroid 50 mcg q.d.  10. Multivitamin 1 q.d.  11. Augmentin 500 milligrams b.i.d.  12. Coumadin 5 milligrams on Monday and Friday and 2.5 milligram on Sunday,      Tuesday, Wednesday, Thursday and Saturday.  13. Lantus 20 units q.h.s.   DISCHARGE INSTRUCTIONS:  He was asked to refrain from driving, heavy lifting  or strenuous activity. He may continue ambulating daily and using his  incentive spirometer. He may shower daily and clean his incisions with soap  and water. He will also have a home health nurse arranged to assist with  wound care and dressing changes. He will continue his same preoperative  diet.   DISCHARGE FOLLOWUP:  He was asked to follow up with his medical doctor for  PT and INR drawn on September 11, 2005 for management of Coumadin. The [facility]  office will contact him with an appointment see Dr. Darrick Penna as well as staple  removal appointment.      Coral Ceo, P.A.    ______________________________  [name]. Fields, MD   GC/MEDQ  D:  09/12/2005  T:  09/13/2005  Job:  [id number]   cc:   Colleen Can. Deborah Chalk, M.D.  Fax: [phone]   Tera Mater. Evlyn Kanner, M.D.  Fax: [phone]

## 2011-03-14 NOTE — Op Note (Signed)
[name], [name]               ACCOUNT NO.:  [id number]   MEDICAL RECORD NO.:  [id number]          PATIENT TYPE:  INP   LOCATION:  2002                         FACILITY:  [facility]   PHYSICIAN:  [name]. Fields, MD  DATE OF BIRTH:  05-17-1946   DATE OF PROCEDURE:  02/12/2006  DATE OF DISCHARGE:  03/03/2006                                 OPERATIVE REPORT   PROCEDURE:  Debridement of right foot and amputation of right third toe.   PREOPERATIVE DIAGNOSIS:  Nonhealing wound, right foot.   POSTOPERATIVE DIAGNOSIS:  Nonhealing wound, right foot.   ANESTHESIA:  Ankle block with sedation.   FINDINGS:  1. Poorly bleeding tissues.   OPERATIVE DETAIL:  After obtaining informed consent, the patient taken the  operating room.  The patient placed supine position operating table.  Ankle  block was placed by the anesthesia team.  Next the patient's right foot was  prepped and draped usual sterile fashion.  Local wound debridement was begun  at a preexisting open wound of the right foot.  Right through the toe was  gangrenous.  This was removed at the metatarsal head, the metatarsal head  debrided.  Devitalized tissue was debrided from the front portion of the  forefoot.  There was poorly bleeding tissue.  A normal saline wet-to-dry  dressing was then applied to the foot.  The patient tolerated procedure well  and there were no complications.  Instrument, sponge and needle counts were  correct at the case.  The patient taken to recovery room in stable  condition.      [name]. Fields, MD  Electronically Signed     CEF/MEDQ  D:  07/01/2006  T:  07/02/2006  Job:  [id number]

## 2011-03-14 NOTE — Op Note (Signed)
NAMECARL, [name]               ACCOUNT NO.:  [id number]   MEDICAL RECORD NO.:  [id number]          PATIENT TYPE:  INP   LOCATION:  5022                         FACILITY:  [facility]   PHYSICIAN:  [name]. Fields, MD  DATE OF BIRTH:  01-May-1946   DATE OF PROCEDURE:  04/23/2006  DATE OF DISCHARGE:                                 OPERATIVE REPORT   PROCEDURE:  Closure of right below-knee amputation.   PREOPERATIVE DIAGNOSIS:  Open right below-knee amputation.   POSTOPERATIVE DIAGNOSIS:  Open right below-knee amputation.   SURGEON:  [name]. Fields, MD   ANESTHESIA:  Local with IV sedation.   OPERATIVE DETAILS:  After obtaining informed consent, the patient was taken  to the operating room.  The patient was placed in a supine position on the  operating table.  The patient's right lower extremity was prepped and draped  in the usual sterile fashion.  Local anesthesia was infiltrated at the skin  edges of the right below-knee amputation stump.  Wound was thoroughly  irrigated with saline solution.  The skin edges were then reapproximated  with interrupted vertical mattress and simple 2-0 nylon sutures.  The  patient tolerated procedure well and there were no immediate complications.  Instrument, sponge and needle count was correct at the end of the case.  The  patient was taken to the recovery room in stable condition.      [name]. Fields, MD  Electronically Signed     CEF/MEDQ  D:  04/23/2006  T:  04/23/2006  Job:  [id number]

## 2011-03-14 NOTE — H&P (Signed)
[place]  Patient:    [name], [name] Visit Number: [id number] MRN: [id number]          Service Type: SUR Location: 2000 2036 01 Attending Physician:  Waldo Laine Dictated by:   Tollie Pizza Collins, P.A.-C. Admit Date:  09/14/2001 Discharge Date: 09/22/2001   CC:         Jeannett Senior A. Evlyn Kanner, M.D.             Colleen Can. Deborah Chalk, M.D.                         History and Physical  CHIEF COMPLAINT:  Nonhealing left foot ulcer.  HISTORY OF PRESENT ILLNESS:  Patient is a 65 year old white male with a history of a nonhealing ulcer on his left heel which has been present for approximately six to seven months.  He has a history of non-insulin-dependent type 2 diabetes mellitus and is followed regularly by Dr. Tera Mater. [place]. Upon further questioning, the patient listed a history of bilateral calf claudication which limited his walking to 10 to 20 feet before pain set in. He also relates some numbness of his feet, especially his toes bilaterally. He was referred to Dr. Quita Skye. Hart Rochester for further evaluation and underwent an arteriogram which showed an occluded left internal iliac artery, stenosis of the right internal iliac artery, occlusion of the left superficial femoral artery with reconstitution and an occluded mid-thigh right SFA with reconstitution.  It was felt that he would be a good candidate for surgical revascularization, however, in the course of his preoperative workup, he was also found to have significant coronary artery disease which warranted intervention as well.  It was elected to proceed with CABG in November of 2002 by Dr. Ramon Dredge B. Gerhardt prior to attempting peripheral revascularization. He has done well since that time, although he continues to have significant symptoms of claudication.  He denies buttock, hip or thigh pain and has only occasionally had rest pain.  He has had no significant peripheral edema and does  not note any temperature change in his lower extremities.  He continues to have a left heel ulcer present but no other nonhealing areas or ischemic changes of his feet or legs.  He was seen recently in the office in followup by Dr. Hart Rochester and had ankle brachial indices performed which were 0.77 on the right and 0.56 on the left.  It was recommended that if at this time if he had recovered well from his cardiac surgery that he proceed with peripheral revascularization.  PAST MEDICAL HISTORY: 1. Coronary artery disease. 2. Type 2 diabetes mellitus, non-insulin dependent. 3. Chronic atrial fibrillation. 4. Hypothyroidism. 5. Hypertension.   PAST SURGICAL HISTORY: 1. Coronary artery bypass grafting on September 15, 2001 by Dr. Tyrone Sage. 2. Left knee arthroscopy in 1984 secondary to a torn cartilage.  CURRENT MEDICATIONS: 1. Coumadin 2.5 mg q.d., last taken on November 25, 2001. 2. Lotensin 10 mg q.d. 3. Glucovance 5/500 mg b.i.d. 4. Synthroid 0.05 mg q.d. 5. Amiodarone 200 mg b.i.d. 6. Centrum Silver one p.o. q.d.  ALLERGIES:  No known drug allergies.  FAMILY HISTORY:  His mother is alive and well at age 80.  She has an enlarged heart but no other significant medical problems.  His father died at age 14 of alcohol-related complication.  Both his grandparents died of complications of diabetes mellitus.  He has two living sisters who are in good health.  SOCIAL HISTORY:  He is widowed with two children.  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  He reports occasional alcohol use, approximately two to three mixed drinks on the weekends.  He also reports a history of 2 to 3 packs per day of cigarette smoking x45 years; he quit in November of 2002 at the time of his heart surgery.  REVIEW OF SYSTEMS:  See history of present illness for pertinent positives. He also reports incisional chest discomfort following his heart surgery.   He has also had some swelling in his right leg, which is the site of his saphenous vein harvest as well.  He had initially lost weight following his CABG but is now starting to regain his weight and his appetite has improved significantly.  He denies fever, chills, recent infections, cardiac-type chest pain, shortness of breath other than that related to his surgery; nausea, vomiting, diarrhea or constipation; abdominal pain, reflux symptoms, heartburn, palpitations, depression or anxiety; headaches, visual changes, TIA symptoms or weakness; dysuria.  PHYSICAL EXAMINATION:  VITAL SIGNS:  Vital signs are noted on the handwritten copy of his H&P.  GENERAL:  This is a well-developed, well-nourished white male in no acute distress.  HEENT:  Normocephalic, atraumatic.  Pupils equal, round and reactive to light and accommodation.  Extraocular movements intact.  TMs and canals are clear. Nares are patent bilaterally.  Posterior pharynx is clear.  He is wearing upper dentures.  His lower teeth are in fair repair.  NECK:  Supple without lymphadenopathy, thyromegaly or bruits.  HEART:  Regular rate and rhythm with an occasional PAC.  No rubs or gallops.  LUNGS:  Clear to auscultation.  He has a well-healed midline sternotomy incision.  ABDOMEN:  Soft, obese, nontender, nondistended, with active bowel sounds in all quadrants.  EXTREMITIES:  No clubbing, cyanosis, or edema.  He has healing right mid-calf-to-groin saphenectomy scar.  There is a small 1-cm left heel ulceration which is not erythematous or draining.  No other nonhealing lesions are noted of the lower extremities.  He has slightly decreased temperature and sensation bilaterally in his lower extremities.  Pulses are 3+ and symmetrical in the carotids and femorals.  His dorsalis pedis and posterior tibial pulses are dopplerable bilaterally.  NEUROLOGIC:  He is alert and oriented x3.  Cranial nerves II-XII grossly intact.  Gait  is within normal limits.   GENITAL:  Deferred.  RECTAL:  Deferred.  ASSESSMENT AND PLAN:  This is a 65 year old white male with a history of peripheral vascular disease and a nonhealing left heel ulcer.  He presents at this time for a left femoral-to-popliteal bypass graft to be performed by Dr. Hart Rochester on December 01, 2001. Dictated by:   Tollie Pizza Collins, P.A.-C. Attending Physician:  Waldo Laine DD:  11/29/01 TD:  11/29/01 Job: [phone] [id number]

## 2011-03-14 NOTE — Discharge Summary (Signed)
NAME:  [name], [name]                         ACCOUNT NO.:  [id number]   MEDICAL RECORD NO.:  [id number]                   PATIENT TYPE:  INP   LOCATION:  2028                                 FACILITY:  [facility]   PHYSICIAN:  [name]. Fields, MD               DATE OF BIRTH:  1946-04-14   DATE OF ADMISSION:  07/12/2003  DATE OF DISCHARGE:  07/19/2003                                 DISCHARGE SUMMARY   DATE OF SURGERY:  July 12, 2003.   ADMITTING DIAGNOSIS:  An occluded right femoral-popliteal bypass graft with  tissue loss on the right toe.   PAST MEDICAL HISTORY:  1. Significant for peripheral vascular disease with non-healing wound of the     right great toe, status post femoral-popliteal bypass graft on December 29, 2002, and status post right femoral endarterectomy, revision of femoral-     popliteal bypass graft, and thrombectomy of the right femoral-popliteal     bypass.  2. Status post left femoral-to-above-the-knee popliteal bypass graft on     December 01, 2001.  3. Coronary artery disease, status post coronary artery bypass grafting on     November 2002.  4. Chronic atrial fibrillation with chronic Coumadin therapy.  5. Diabetes mellitus, type 2.  6. COPD.  7. Hypothyroidism.  8. Peripheral neuropathy.  9. Hiatal hernia.  10.      Hypercholesterolemia.   ALLERGIES:  No known drug allergies.   DISCHARGE DIAGNOSES:  1. Occluded right femoral-popliteal bypass graft, tissue loss of the right     toe, status post right femoral endarterectomy, revision of proximal     anastomosis of femoral-popliteal bypass graft, patch angioplasty of the     digital anastomosis of the femoral-popliteal bypass graft, and     thrombectomy of the femoral-popliteal bypass graft.  2. Anemia.   BRIEF HISTORY:  This is a 65 year old male, who previously underwent a right  femoral-to-below-the-knee popliteal artery bypass grafting on December 29, 2002, secondary to a non-healing toe  wound of the right foot.  The patient  was admitted on July 12, 2003 due to an occluded right femoral-  popliteal bypass graft with tissue loss on the right great toe.   HOSPITAL COURSE:  Patient was admitted and taken to the OR on July 12, 2003, where he underwent a right femoral endarterectomy, revision of the  proximal anastomosis of the right femoral-popliteal bypass, patch  angioplasty of the digital anastomosis of the femoral-popliteal bypass,  thrombectomy of the femoral-popliteal bypass graft, and intraoperative  arteriogram.   The patient tolerated the procedures well.  The patient had a brisk right  dorsalis pedis and posterior tibialis Doppler signals postoperatively.   The patient was hemodynamically stable postoperatively and was extubated  without problem.  The patient woke up from anesthesia neurologically intact.   The patient's atrial fibrillation remained well controlled postoperatively.   On postoperative  day two, the patient's blood pressure was 93/68 with a  heart rate of 124; therefore, the blood pressure medications were held, and  a fluid challenge was given.  The patient responded well.  The blood  pressure medications were subsequently resumed.  Patient remained on heparin  until the level was therapeutic.  Coumadin was restarted on postoperative  day two.   There is a small area of skin separation in the mid-calf incision with  serous drainage.  The patient remained on IV antibiotic Ancef throughout  hospitalization and will be discharged   The patient was found to be anemic on postoperative day five, with a  hemoglobin and hematocrit of 8.5 and 25.1.  The patient refused a blood  transfusion, however.   On the day of discharge, the patient was afebrile, and the vital signs were  stable.  The patient is therapeutic on Coumadin.  There is minimal discharge  from the incision, and the foot is warm.  The patient is stable for  discharge.    LABORATORY DATA:  CBC, on July 18, 2003, hemoglobin 8.4, hematocrit  24.8, white count 7.2, platelets 215.  BMP, on July 15, 2003, sodium  137, potassium 3.8, BUN 21, creatinine 1.3, glucose 138.  PT/INR, on  July 19, 2003, PT 23.4, INR 2.9.   CONDITION ON DISCHARGE:  Improved.   INSTRUCTIONS:  Patient is to resume his home medications.  He is to take  Coumadin 2.5 mg 1 p.o. q.d., Avalox 400 mg 1 p.o. q.d.   PAIN MANAGEMENT:  Tylox 1-2 p.o. q.4-6h.  p.r.n. pain.   ACTIVITY:  1. No driving, heavy lifting, or strenuous activity.  2. The patient is to keep the leg elevated.   DIET:  Low fat, low salt.   WOUND CARE:  1. Shower daily and clean incision with soap and water.  2. Patient is to change the dressing on the wound several times per day.    SPECIAL INSTRUCTIONS:  Home Health nurse will draw PT and INR on Friday,  July 21, 2003, and call the results to Dr. Ronnald Nian office for further  Coumadin adjustments.   FOLLOW UP:  Dr. Darrick Penna, and the office will call the patient to schedule an  appointment for a one-week followup.      Pecola Leisure, PA                      Charles E. Fields, MD    AY/MEDQ  D:  07/19/2003  T:  07/20/2003  Job:  [id number]   cc:   [name]. Fields, MD  [address], [place] [address]   Colleen Can. Deborah Chalk, M.D.  Fax: [phone]   Patient's hospital chart

## 2011-03-14 NOTE — Discharge Summary (Signed)
[name], [name]               ACCOUNT NO.:  [id number]   MEDICAL RECORD NO.:  [id number]          PATIENT TYPE:  INP   LOCATION:  5712                         FACILITY:  [facility]   PHYSICIAN:  [name]. Fields, MD  DATE OF BIRTH:  16-Oct-1946   DATE OF ADMISSION:  01/24/2005  DATE OF DISCHARGE:  01/31/2005                                 DISCHARGE SUMMARY   HISTORY OF THE PRESENT ILLNESS:  The patient is a 65 year old male who had  previously undergone a right femoral-to-popliteal bypass with Gore-Tex by  Dr. Hart Rochester in March 2004.  This subsequently became infected and slowly  healed over time.  In September 2004 the femoropopliteal bypass occluded.  At that time he underwent an extended right femoral endarterectomy with  revision of the proximal anastomosis and a patch angioplasty of the distal  anastomosis.  After this procedure he had some difficulty healing his below  knee incision, but after a few months this gradually healed.  He presented  in follow up in June 2005 at which time surveillance scan noted some  stenosis of the right proximal anastomosis.  It was also noted at that time  that the patient had weak brachial pulses.  This suggested subclavian  stenosis.  He was set up for a CT angiogram to evaluate the stenosis of his  right leg as well as his great vessels; however, he never showed up for any  of these examinations.   The patient presented on the date of admission to the office were he was  seen by Dr. Darrick Penna and found to have increasing pain in his right foot with  duskiness.  The pain was somewhat improved by dangling the foot.  It began  approximately three to five days prior to presentation.  He has a small  ulcer on the plantar aspect of the foot.  He is only able to walk short  distances without experiencing severe calf claudication.   PAST MEDICAL HISTORY:  The patient's past medical history is as above.  He  also had a previous left femoral-to-popliteal  bypass above the knee with  Gore-Tex in February 2003.  He had an incision and drainage of a left thigh  lymphocele in March 2003.  He had coronary artery bypass grafting by Dr.  Tyrone Sage in November 2002.  He has a history of diabetes and is insulin-  dependent.  He has chronic obstructive pulmonary disease, hypothyroidism,  dyslipidemia, hiatal hernia, hypertension, and some peripheral neuropathy.  He continues to smoke two to three packs of cigarettes per day.   MEDICATIONS:  Medications prior to admission:  1.  Lasix 20 mg daily.  2.  Gemfibrozil 600 mg b.i.d.  3.  Vytorin 10/40 daily.  4.  Glyburide 4 mg 1/2 tablet daily.  5.  Metoprolol 50 mg daily.  6.  Avandia 8 mg 1/2 tablet daily.  7.  Synthroid 50 mcg daily.  8.  Benazepril/hydrochlorothiazide 10/12.5 1 daily.  9.  Coumadin 2.5 mg daily.  He takes Coumadin for a history of atrial      fibrillation.   FAMILY HISTORY:  Please see the history and physical done at the time of  admission.   SOCIAL HISTORY:  Please see the history and physical done at the time of  admission.   REVIEW OF SYSTEMS:  Please see the history and physical done at the time of  admission.   PHYSICAL EXAMINATION:  Please see the history and physical done at the time  of admission.   HOSPITAL COURSE:  The patient was admitted.  He was started on heparin.  He  was scheduled for an angiogram with possible lysis.  Findings included an  occluded right fem-pop with threatened limb.  The patient also had thoracic  arteriogram with bilateral subclavian done as well as aortogram with  bilateral lower extremity runoff with attempted right femoral  catheterization.  The findings included normal thoracic and subclavian  systems, bilateral iliac vascular disease with right greater than left with  a moderate right common femoral artery stenosis, and also chronically  occluded right fem-pop bypass with collateral-supplying disease, two-vessel  runoff, and  finally, patent left lower extremity fem-pop bypass.  These  findings were discussed with Dr. Darrick Penna who reviewed all the studies. The  patient was subsequently felt to require further revascularization and on  January 29, 2005 he was taken to he operating room where he underwent the  following procedure; thrombectomy and revision of right femoropopliteal  bypass.  The patient tolerated the procedure well and was taken to the post-  anesthesia care unit in stable condition.   Postoperative Hospital Course:  The patient has done well.  Postop ABIs were  not obtainable due to calcified vessels, but clinically he has shown  significant improvement in the right foot and it appears to be well  perfused.  The incision is healing well.  He remained hemodynamically  stable.  He had some difficulty with constipation, but this has resolved.  He was initially placed, postoperatively on heparin and started as well on  Coumadin. Overall he is felt to be stable for discharge on today's date,  January 31, 2005.   DISCHARGE MEDICATIONS:  Medications on discharge are as preoperatively.  1.  The patient will continue his Coumadin at 2.5 mg daily.  2.  For pain, Tylox 1-2 q.6 hours p.r.n. as needed.   DISCHARGE INSTRUCTIONS:  The patient received written instructions with  regard to medications, activities, diet, wound care and follow up.  ___________ be arranged for the patient to see Dr. Darrick Penna on two weeks; the  office will arrange this appointment.   DISCHARGE DIAGNOSES:  1.  Severe peripheral vascular occlusive disease as described now status      post the aforementioned procedure.  2.  Other diagnoses as listed per the previous history.   DISCHARGE CONDITION:  The condition on discharge is stable and improved.      WEG/MEDQ  D:  01/31/2005  T:  02/01/2005  Job:  [id number]   cc:   [facility] Thor[facility] Surgery Office

## 2011-03-14 NOTE — Op Note (Signed)
[place]  Patient:    [name], [name] Visit Number: [id number] MRN: [id number]          Service Type: MED Location: [id number] Attending Physician:  Waldo Laine Dictated by:   Gwenith Daily Tyrone Sage, M.D. Proc. Date: 09/15/01 Admit Date:  09/14/2001   CC:         Colleen Can. Deborah Chalk, M.D.  Tera Mater. Evlyn Kanner, M.D.   Operative Report  PREOPERATIVE DIAGNOSES: 1. 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 2. Atrial fibrillation of unknown duration, rule out intra-atrial clot. 3. Severe peripheral vascular disease. 4. Chronic obstructive pulmonary disease.  POSTOPERATIVE DIAGNOSES: 1. 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 2. Atrial fibrillation of unknown duration, with probable intra-atrial clot    determined by transesophageal echocardiogram. 3. Severe peripheral vascular disease. 4. Chronic obstructive pulmonary disease.  PROCEDURES: 1. Coronary artery bypass grafting x 4 with the left internal mammary to the    left anterior descending coronary artery, reversed saphenous vein graft to    the diagonal coronary artery, reversed saphenous vein graft to the third and    fourth obtuse marginal. 2. Exploration of the left atrium, examination of the left atrial appendage,    and obliteration of left atrial appendage.  SURGEON:  Gwenith Daily. Tyrone Sage, M.D.  FIRST ASSISTANT:  Sherrie George, P.A.  BRIEF HISTORY:  The patient is a 65 year old male admitted with nonhealing ulcer on the left foot.  He had known significant peripheral vascular disease on both lower extremities, worse on the left than the right.  He had poorly-controlled diabetes, chronic smoking history up until the morning of surgery.  He initially saw Dr. Hart Rochester in regard to his peripheral vascular disease and nonhealing ulcer.  Because of his symptoms and the fact that he was found to be in atrial fibrillation of unknown  duration, he was referred to Dr. Roger Shelter for cardiology evaluation.  After Cardiolite stress test for risk stratification and because of significant symptoms of chest discomfort and especially associated with shortness of breath, cardiac catheterization was recommended.  The patient was noted to be in atrial fibrillation without any records of the duration of this.  The patient was unaware of it.  Cardiac catheterization revealed a small, hyperplastic right coronary artery, a dominant circumflex with diffuse disease in multiple branches, most of which were of extremely small size and with diffuse distal disease.  The diagonal vessel was also small but thought to be bypassable. The LAD had greater than 80-90% proximal stenosis.  Overall ventricular function was depressed with a 35-40% ejection fraction.  Because of the patients need for further surgery and his symptoms, coronary artery bypass grafting was recommended.  Because there was concern with his atrial fibrillation that he had not previously been anticoagulated and was in atrial fibrillation that there may be intra-atrial clot that could potentially cause a stroke if he converted to sinus rhythm during or early after the surgery, for this reason placement of transesophageal echo was recommended to the patient to evaluate for possible intra-atrial clot.  The risks of the procedure, including death, infection, stroke, myocardial infarction, bleeding, blood transfusion, were all discussed with the patient in detail. It was discussed with him that because of his overall poor status, significant pulmonary disease, and severe diabetes and atrial fibrillation, he was at increased risk for a stroke or other postoperative complication.  He agreed to proceed with surgery as outlined.  DESCRIPTION OF PROCEDURE:  With Swan-Ganz and  arterial line monitors in place, the patient underwent general endotracheal anesthesia.  A transesophageal  echo probe was placed by Guadalupe Maple, M.D., and was reviewed by both Dr. Noreene Larsson and Burna Forts, M.D.  On examination, the patient had decreased LV function, a patent mitral valve without significant mitral regurgitation.  The left atrial appendage appeared to have near the base some increased echodense areas, possibly clots, calcifications.  The atrial appendage itself appeared to have little or now flow in it and because of these findings, there was concern there was intra-atrial clot though a well-formed thrombus could not be definitely identified, but because of this concern it was decided in addition to bypass surgery, the left atrium would be explored and left atrial appendage obliterated under controlled circumstances to decrease the chance of thrombus embolization.  Vein was harvested from the right thigh and was of adequate quality and caliber.  A median sternotomy was performed.  Left internal mammary artery was dissected down as a pedicle graft.  The distal artery was divided and had good, free flow.  Pericardium was opened.  Because of the patients COPD, he had a very deep chest with significant amount of mediastinal fat.  He was systemically heparinized.  The ascending aorta was cannulated.  Superior and inferior vena caval cannulas were placed. Retrograde cardioplegia catheter was placed.  The patient was placed on cardiopulmonary bypass 2.4 L/min. per sq. m.  Sites of anastomosis were inspected.  The right coronary artery was not bypassable.  The diagonal and first obtuse marginal were thought to be intramyocardial and were of small quality.  A third and fourth obtuse marginal were identified on the inferior surface of the heart.  These both were small and very diffusely diseased but appeared to be bypassable.  During this process care was taken to keep the manipulation of the heart, and especially the left atrium, to a minimum.  The  patients body temperature was  cooled to 28 degrees, aortic crossclamp was applied, and 500 cc of cold blood potassium cardioplegia was administered with rapid diastolic arrest of the heart.  Myocardial septal temperature was monitored throughout the crossclamp period.  The left atrium was opened in the intra-atrial groove and retractors applied to be able to examine the left atrium.  No obvious intra-atrial clot was recognized.  Because of the patients very deep chest and opening of the left atrial appendage showing the base of the left atrial appendage, it was decided it would be difficult exposure, so a small Ace sponge was placed into the left atrium to catch any potential clot coming from the left atrial appendage. and from outside the heart, the space of the left atrial appendage was obliterated with suture ligatures with 0 silk ties.  The left atrium was then again explored and the sponge removed to ensure there was no free clot or debris in the left atrium. The left atrium was then closed with horizontal mattress 3-0 Prolene suture. Attention was then turned to the remainder of the coronary anatomy.  The third obtuse marginal coronary artery was opened and was a diffusely diseased vessel of very poor quality.  It did admit a 1 mm probe.  Using a diamond-type, side-to-side anastomosis was carried out with a running 8-0 Prolene.  The distal extent of the same vein was then carried to an also diffusely diseased fourth obtuse marginal, which admitted a 1.5 mm probe and was slightly larger than the third vessel.  Using a running 7-0 Prolene,  a distal anastomosis was performed.  Attention was then turned to what initially was thought to be the diagonal and obtuse marginal.  These both were intramyocardial, as was the LAD.  What was thought to be the diagonal was then anastomosed with a side-to-side anastomosis with a running 8-0 Prolene.  The distal extent of the same vein was then carried to what was thought to be the  intramyocardial small first obtuse marginal vessel.  The distal anastomosis was performed with running 8-0 Prolene.  This vessel was also very small, barely admitting a 1 mm probe.  Additional cold blood cardioplegia was administered down the vein grafts.  Attention was then turned to the partially-intramyocardial LAD distally.  The vessel was opened and was of reasonable size distally, admitting a 1.5 mm probe.  With passing the probe proximally, it became apparent that the identification of what was thought to be the diagonal was actually a proximal portion of the LAD, so to avoid any competitive flow between the mammary and the vein graft, the vein graft was divided and a small segment of vein graft was left on the proximal LAD as a patch closure of the aortotomy, and the proximal and distal branches of the vein graft were ligated and clipped.  The previously-placed vein graft was then anastomosed end-to-end.  This ultimately left [place] with the vein graft to what was the diagonal rather than the first obtuse marginal.  Further looking on the lateral wall of the heart, no first obtuse marginal could be identified and was presumed to be deeply intramyocardial.  The mammary was placed to the distal LAD with a running 8-0 Prolene, and we were left with a vein graft to the diagonal and a sequential vein graft to the third and fourth obtuse marginal.  With release of the Edwards bulldog on the mammary artery, there was appropriate rise in myocardial temperature.  The head was put in a down position.  The heart was allowed to passively fill and maneuvers to de-air the heart were carried out, and with this the atrial closure was completed.  The aortic crossclamp was removed with a total crossclamp time of 147 minutes.  A partial occlusion clamp was placed on the ascending aorta.  Two punch aortotomies were performed, each of the two vein grafts anastomosed to the ascending aorta.  Air was  evacuated from the grafts.  The partial occlusion clamp was removed.  The patient returned to a sinus rhythm.  With his body temperature rewarmed to 37 degrees, he was started initially with a dopamine infusion.  The right ventricle particularly appeared sluggish.  He was started on epinephrine infusion also.  He was then ventilated and weaned from cardiopulmonary bypass and on low-dose pressors of epinephrine and dopamine, remained hemodynamically stable.  He was decannulated, protamine sulfate was administered.  Because of postoperative oozing, platelets were also administered.  Two atrial and two ventricular pacing wires were applied, graft markers were applied.  The mediastinal fat over the ascending aorta was loosely reapproximated.  Because of the size of the patients ventricle, a portion of the right ventricle was covered with pericardium but this was not complete.  A left pleural tube and two mediastinal tubes left in place. Sternum was closed with #6 stainless steel wire, fascia closed with interrupted 0 Vicryl, running 3-0 Vicryl in the subcutaneous tissue, 4-0 subcuticular stitch in the skin edges.  Dry dressings were applied.  Sponge and needle count were reported as correct at the completion of  the procedure. The patient tolerated the procedure without obvious complication and was transferred to the surgical intensive care unit for postoperative care. Because of the very diffuse nature of this patients coronary disease, he would not be considered a candidate for redo bypass surgery under any circumstances.  Total pump time was 240 minutes. Dictated by:   Gwenith Daily Tyrone Sage, M.D. Attending Physician:  Waldo Laine DD:  09/17/01 TD:  09/17/01 Job: [id number]

## 2011-03-14 NOTE — H&P (Signed)
[name], [name]               ACCOUNT NO.:  [id number]   MEDICAL RECORD NO.:  [id number]          PATIENT TYPE:  INP   LOCATION:  5731                         FACILITY:  [facility]   PHYSICIAN:  [name]. Fields, MD  DATE OF BIRTH:  [birth year]   DATE OF ADMISSION:  01/24/2005  DATE OF DISCHARGE:                                HISTORY & PHYSICAL   CHIEF COMPLAINT:  Right foot pain.   HISTORY OF PRESENT ILLNESS:  The patient is a 65 year old male who had  previously undergone a right femoral-to-popliteal bypass with Gore-Tex by  Dr. Hart Rochester in March 2004.  This subsequently became infected and slowly  healed over time.  In September 2004, the femoral popliteal bypass occluded.  At this time, he underwent an extended right femoral endarterectomy with  revision of the proximal anastomosis and a patch angioplasty of the distal  anastomosis.  After this procedure, he had some difficulty healing his below  know incision, but after a few months this gradually healed.  He presented  for followup in June 2005 at which time a graft surveillance scan noted some  stenosis at the proximal anastomosis.  It was also noted at that time that  the patient had weak brachial pulses bilaterally and a suggestion of a  subclavian occlusive disease.  He was set up for a CT angiogram to evaluate  the stenosis in his right leg as well as his great vessels.  However, he  never showed up for any of these examinations.  He presented to the office  today with increasing pain in his right foot and duskiness.  He states that  the pain is improved somewhat by dangling the foot.  It has been going on  for approximately 3-5 days.  He has a small ulcer on the plantar aspect of  his foot.  He is only able to walk short distances without experiencing  severe calf claudication.   His past medical history is as above.  He has also had a previous left  femoral-to-popliteal bypass above the knee with Gore-Tex in February  2003.  He had incision and drainage of left thigh lymphocele in March 2003.  He had  coronary artery bypass grafting by Dr. Tyrone Sage in November 2002.  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  He has chronic obstructive  pulmonary disease, hypothyroid, dyslipidemia, hiatal hernia, hypertension,  and some peripheral neuropathy.  He continues to smoke 2-3 packs of  cigarettes per day.   His medications include:  1.  Lasix 20 mg once daily.  2.  Gemfibrozil 600 mg b.i.d.  3.  Vytorin 10/40 daily.  4.  Glimepiride 4 mg 1/2 tablet daily.  5.  Metoprolol 50 mg daily.  6.  Avandia 8 mg  1/2 tablet daily.  7.  Synthroid 50 mcg daily.  8.  Benazepril/Hydrochlorothiazide 10/12.5 one daily.  9.  Coumadin 5 mg, 1/2 tablet daily.   He takes Coumadin for a history of atrial fibrillation.   REVIEW OF SYSTEMS:  He denies chest pain, but is not very active.  He  reports shortness of breath with exertion.   PHYSICAL EXAMINATION:  VITAL SIGNS:  Blood pressure 112/60, pulse 74.  HEENT:  Unremarkable.  NECK:  No carotid bruits.  CHEST:  Clear to auscultation.  CARDIAC:  Regular rate.  ABDOMEN:  Soft, nontender, nondistended.  EXTREMITIES:  Lower extremity exam shows 2+ left femoral pulse and absent  right femoral pulse although he has dense scar tissue in both groins.  He  has no popliteal or pedal pulses on the right foot.  The right foot is  slightly dusky and cool.  There is a  5-mm ulcer on the plantar aspect in the middle section of his foot.  The  left leg is warm and well perfused.  He has palpable popliteal pulse.  NEUROLOGIC:  Slight decrease sensation in the right foot and left foot,  right being worse than left.   He had a graft duplex  ultrasound today which shows that his left bypass is  patent.  His right bypass is occluded.   ASSESSMENT:  1.  Occluded right femoral popliteal bypass graft with rest pain.  2.  History of noncompliance and most likely has an inflow  cause for his      femoral popliteal bypass occlusion.   Since the patient is on Coumadin and also has a history of renal  insufficiency with previous creatinine in the 1.7 to 1.8 range, we will  admit him today to stop his Coumadin, start heparin, and hydrate him for an  angiogram and possible lysis on Monday.  The patient was informed of the  risks of limb loss from this procedure.  He was also again counseled that  his combination of smoking and diabetes has put him at high risk for limb  loss.      CEF/MEDQ  D:  01/24/2005  T:  01/24/2005  Job:  [id number]

## 2011-03-14 NOTE — Op Note (Signed)
[name], NEESON               ACCOUNT NO.:  [id number]   MEDICAL RECORD NO.:  [id number]          PATIENT TYPE:  INP   LOCATION:  3310                         FACILITY:  [facility]   PHYSICIAN:  [name]. Fields, MD  DATE OF BIRTH:  1946-03-16   DATE OF PROCEDURE:  02/08/2006  DATE OF DISCHARGE:                                 OPERATIVE REPORT   PROCEDURE:  Thrombectomy of right axillary femoral bypass.   PREOPERATIVE DIAGNOSIS:  Occluded right axillary femoral bypass.   POSTOPERATIVE DIAGNOSIS:  Occluded right axillary femoral bypass.   ANESTHESIA:  General.   ASSISTANT:  Nurse.   INDICATIONS:  The patient is a 65 year old male who is postoperative day #2  from a right axillary femoral bypass and thrombectomy of a right fem-pop  bypass.  He had postoperative hypotension on postoperative day zero.  He  occluded his axillary femoral bypass at that time.  He was resuscitated and  had weaning of his pressors as now hemodynamically stable for thrombectomy  of his ax-fem bypass.   OPERATIVE FINDINGS:  1.  Thrombosed right ax-fem bypass with no anastomotic narrowing of proximal      anastomosis.   OPERATIVE DETAIL:  After obtaining informed consent, the patient taken to  operating room.  The patient placed in supine position operating table.  After induction of general anesthesia endotracheal elevation, the patient's  entire right chest, abdomen and right lower extremity were prepped, draped  usual sterile fashion.  The preexisting right infraclavicular incision was  reopened, carried down through subcutaneous tissues down to level of the  pectoralis muscle.  Retractors were placed to show the proximal anastomosis  of axillary femoral bypass.  There was no obvious kinking.  The graft was  elevated up in the operative field and was found to be completely  thrombosed.  There was a pulse proximal and distal to the anastomosis in the  axillary artery.  Next the patient is given 5000  units of intravenous  heparin.  A transverse graftotomy was made just above the level of the  proximal anastomosis.  Number 4 Fogarty catheter was used to thrombectomize  the proximal portion.  There was good arterial inflow at this point.  The  proximal anastomosis was found be widely patent.  This was then clamped  proximally with a Henley clamp.  Distal limb of the graft was then  thrombectomized with number 4 and number 5 Fogarty catheters.  There was  good arterial back bleeding at this point.  All thrombotic material was  removed until a clean pass was obtained.  This was then thoroughly flushed  with heparinized saline and clamped distally with a peripheral DeBakey  clamp.  The graftotomy was then repaired using running 6-0 Prolene suture.  Just prior to completion of anastomosis, this was fore bled, back bled and  thoroughly flushed.  Anastomosis was secured, clamps were released.  There  was palpable pulse in the graft immediately.  The patient's foot also became  more pink and had a audible dorsalis pedis Doppler signal.  Intraoperative  pressure measurement was then made of the  right ax-fem-graft.  A 21 gauge  butterfly needle was introduced into the graft approximately 2 cm below the  proximal anastomosis.  The pressure via this arterial line through the graft  was 120/63 mmHg.  This was compared to the left brachial arterial line which  was correlating with the right arm blood pressure cuff.  The left brachial  arterial line was 122/64.   Next hemostasis was obtained with thrombin and Gelfoam.  The deep layers  were closed with multiple running 2-0 Vicryl sutures.  The subcutaneous  tissues closed with a running 3-0 Vicryl suture.  Skin was closed with  staples.  The patient was also noted to have an audible Doppler signal in  the mid graft portion of the ax-fem at the end of the case.  The patient  tolerated procedure well and there were no complications.  Instrument,   sponge and needle counts correct the end of the case.  The patient taken to  recovery room in stable condition.           ______________________________  [name] Fields, MD     CEF/MEDQ  D:  02/08/2006  T:  02/09/2006  Job:  [id number]

## 2011-03-14 NOTE — H&P (Signed)
[name], [name]               ACCOUNT NO.:  [id number]   MEDICAL RECORD NO.:  [id number]          PATIENT TYPE:  INP   LOCATION:  5022                         FACILITY:  [facility]   PHYSICIAN:  [name]. Fields, MD  DATE OF BIRTH:  Jun 22, 1946   DATE OF ADMISSION:  03/27/2006  DATE OF DISCHARGE:                                HISTORY & PHYSICAL   CARDIOLOGIST:  Dr. Deborah Chalk   Primary Physician  Dr. Evlyn Kanner   CHIEF COMPLAINT:  Non-healing right BKA.   HISTORY OF PRESENT ILLNESS:  This is a 65 year old Caucasian male well known  to the [facility].  The patient has a history of peripheral vascular occlusive  disease status post multiple failed revascularization procedures and recent  right BKA one month ago.  The patient was seen in the office today to  evaluate his right BKA.  It was noted that this is not healing properly.  The patient has been doing daily dressing changes to his right BKA.  In the  office today the patient's right BKA staples were removed and his wound was  debrided by Dr. Darrick Penna.  The patient is being admitted for possible AKA  Monday, Feb 27, 2006.   The patient complains of right lower extremity ulceration with drainage and  odor.  There is also some noted erythema.  Patient denies any pain in his  right BKA.  He denies any rest pain, tingling, decrease in temperature,  decreased in motor function, tenderness, and numbness.  Patient denies  fevers or chills, vomiting, and diarrhea.  Patient does not have any chest  pain, shortness of breath, dyspnea on exertion, PND, orthopnea, TIA/CVA  symptoms.   MEDICAL HISTORY:  1.  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  2.  Occluded right axial femoral bypass.  3.  Chronic atrial fibrillation.  4.  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  5.  Coronary artery disease status post CABG.  6.  Diabetes mellitus type 2.  7.  Hypothyroidism.  8.   Hyperlipidemia.  9.  Hypertension.  10. ASCVD.  11. History of hiatal hernia.  12. History of myopathy.  13. Acute on chronic renal insufficiency.   PAST SURGICAL HISTORY:  1.  Thrombectomy right fem-pop bypass graft with intraoperative arteriogram.  2.  Right axial femoral bypass graft.  3.  Right BKA.  4.  Coronary artery bypass graft.  5.  History of great toe pain amputation of the right foot.   ALLERGIES:  No known drug allergies.   MEDICATIONS:  1.  Hydrochlorothiazide 12.5 mg p.o. daily.  2.  Vytorin 10/40 mg p.o. q.h.s.  3.  Multivitamin.  4.  Lopid 600 mg p.o. daily.  5.  Amaryl 2 mg p.o. daily.  6.  Avandia 2 mg p.o. daily.  7.  Lantus 28 units subcutaneous q.h.s.  8.  Colace 100 mg.  9.  Lopressor 50 mg b.i.d.  10. __________ 0.125 mg p.o. daily.  11. Coumadin 5 mg every Monday and Friday.  12. Coumadin 2.5 mg every Tuesday, Thursday, Saturday, Sunday,  Wednesday.  13. Sliding-scale insulin.  14. Tylox one to two tablets every four to hours p.r.n. pain.   REVIEW OF SYSTEMS:  See HPI for pertinent positives and negatives.  Otherwise, negative for TIA/CVA, BPH.  Patient does complain of some nausea  since this morning.  No vomiting.   SOCIAL HISTORY:  Patient is single and lives alone.  His son does live  nearby to assist him with care.  The patient is on disability.  He states he  still wants to drive; however, he has not driven since his recent right BKA.  Patient continues to smoke two to three packs a day for the past 50 years.  Patient denies regular alcohol use.   FAMILY HISTORY:  Patient's father deceased at 46 with esophageal varices.   PHYSICAL EXAMINATION:  VITAL SIGNS:  Vital signs stable and patient  afebrile.  GENERAL:  A 65 year old Caucasian male in no acute distress.  HEENT:  Normocephalic, atraumatic.  Pupils equal, round, and reactive light  and accommodation.  Extraocular movements intact.  Oral mucosa pink, moist.  Sclerae anicteric.   NECK:  Supple.  No carotid bruit appreciated.  RESPIRATORY:  Symmetrical on inspiration.  Unlabored and clear to  auscultation bilaterally.  CARDIAC:  Irregular rate and rhythm.  No murmur, gallop, rub.  ABDOMEN:  Soft, nontender, nondistended.  Normoactive bowel sounds x4  GENITOURINARY:  Deferred.  RECTAL:  Deferred.  EXTREMITIES:  No edema.  Patient's right BKA dressing is clear, dry, and  intact.  Left lower extremity is warm.  Pulses revealed pulses 2+  bilaterally.  Femoral pulses 1+ bilaterally.  No popliteal, dorsalis pedis,  or posterior tibial pulses were palpated.  The patient does have positive  dorsalis pedis and posterior tibial pulses of the left lower extremity on  Doppler.  NEUROLOGIC:  Nonfocal.  Alert and oriented x4.  Patient has 5+ muscle  strength in the right and left upper extremities.  He has 5+ muscle strength  in the left lower extremity.  Patient has 5+ muscle strength in his right  side.  Patient has deep tendon reflexes 2+ bilaterally.   ASSESSMENT:  1.  Non-healing right below-knee amputation.  2.  Peripheral vascular occlusive disease.   PLAN:  1.  Will admit the patient [facility] March 27, 2006 under Dr.      Evelina Dun service.  2.  See admission orders.  3.  Possible right AKA Monday, Feb 27, 2006.  4.  Risks and benefits were explained to the patient.  Dr. Darrick Penna has      evaluated the patient prior to admission.      Constance Holster, PA      [name]. Fields, MD  Electronically Signed    JMW/MEDQ  D:  03/27/2006  T:  03/27/2006  Job:  [id number]   cc:   Colleen Can. Deborah Chalk, M.D.  Fax: [phone]   Tera Mater. Evlyn Kanner, M.D.  Fax: [phone]   Terrial Rhodes, M.D.  Fax: [phone]

## 2011-03-14 NOTE — H&P (Signed)
NAMEJAQUAVIUS, HUDLER               ACCOUNT NO.:  [id number]   MEDICAL RECORD NO.:  [id number]          PATIENT TYPE:  INP   LOCATION:  5712                         FACILITY:  [facility]   PHYSICIAN:  Di Kindle. Edilia Bo, M.D.DATE OF BIRTH:  04-Nov-1945   DATE OF ADMISSION:  01/24/2005  DATE OF DISCHARGE:  01/31/2005                                HISTORY & PHYSICAL   Audio too short to transcribe (less than 5 seconds)      CSD/MEDQ  D:  03/05/2005  T:  03/05/2005  Job:  [id number]

## 2011-03-14 NOTE — Discharge Summary (Signed)
[name], [name]               ACCOUNT NO.:  [id number]   MEDICAL RECORD NO.:  [id number]          PATIENT TYPE:  INP   LOCATION:  5022                         FACILITY:  [facility]   PHYSICIAN:  Rowe Clack, P.A.-C. DATE OF BIRTH:  [birth year]   DATE OF ADMISSION:  03/27/2006  DATE OF DISCHARGE:  04/23/2006                                 DISCHARGE SUMMARY   HISTORY OF PRESENT ILLNESS:  The patient is a 65 year old Caucasian male  well known to [facility].  The patient has a history of peripheral vascular  occlusive disease status post multiple failed revascularization procedures  and recent right-below-knee amputation one month ago.  The patient was seen  in the office on the date of admission to evaluate his right-below-knee  amputation.  It was noted not to be healing properly.  The patient had been  doing daily dressing changes to the right-below-knee amputation.  In the  office, the patient had his staples removed and the wound was debrided and  the patient was felt to require admission for further treatment including  revision of the amputation.   PAST MEDICAL HISTORY:  1.  Nonischemic right lower extremity status post right-below-knee      amputation.  2.  Occluded right axillofemoral bypass.  3.  Chronic atrial fibrillation.  4.  Peripheral vascular occlusive disease status post bilateral   DICTATION ENDS HERE.      Rowe Clack, P.A.-C.     Sherryll Burger  D:  04/23/2006  T:  04/23/2006  Job:  [id number]   cc:   Jeannett Senior A. Evlyn Kanner, M.D.  Fax: [phone]   Colleen Can. Deborah Chalk, M.D.  Fax: [phone]   Terrial Rhodes, M.D.  Fax: [phone]

## 2011-03-14 NOTE — Op Note (Signed)
   NAME:  [name], [name]                         ACCOUNT NO.:  [id number]   MEDICAL RECORD NO.:  [id number]                   PATIENT TYPE:  INP   LOCATION:  3303                                 FACILITY:  [facility]   PHYSICIAN:  Quita Skye. Hart Rochester, M.D.               DATE OF BIRTH:  11-26-1945   DATE OF PROCEDURE:  01/21/2003  DATE OF DISCHARGE:                                 OPERATIVE REPORT   PREOPERATIVE DIAGNOSIS:  Right thigh and popliteal space wound infection,  status post right femoropopliteal graft three weeks previously.   POSTOPERATIVE DIAGNOSIS:  Right thigh and popliteal space wound infection,  status post right femoropopliteal graft three weeks previously.   OPERATION:  Incision and drainage of distal thigh and popliteal space wound.   SURGEON:  Quita Skye. Hart Rochester, M.D.   FIRST ASSISTANT:  Nurse.   ANESTHESIA:  General endotracheal.   DESCRIPTION OF PROCEDURE:  The patient was taken to the operating room and  placed in the supine position, at which time satisfactory general  endotracheal anesthesia was administered.  The right leg was prepped with  Betadine scrub and solution and draped in a routine sterile manner.  There  was purulent drainage emanating from the distal end of the calf wound from  the femoropopliteal bypass graft in the right leg.  The incision was  extended through the previous scar proximally and the entire wound was  opened.  This wound began in the distal thigh and extended into the mid calf  medially.  A lot of brownish purulent material was evacuated, which was not  malodorous.  The wound was thoroughly irrigated with warm saline abundantly.  I inspected the deep area of the wound both above the knee and below the  knee and was not able to visualize the Gore-Tex femoropopliteal graft, so I  did not exposure the graft.  There was no bleeding coming from any of the  deep structures.  Following thorough irrigation on multiple occasions, the  wound was  packed with Kerlix moistened with Betadine solution and dressed  with 4 x 4s, an ABD pad, and an Ace wrap.  The patient was taken to the  recovery room in satisfactory condition.                                               Quita Skye Hart Rochester, M.D.    JDL/MEDQ  D:  01/21/2003  T:  01/22/2003  Job:  [id number]

## 2011-03-14 NOTE — Op Note (Signed)
[name], [name]               ACCOUNT NO.:  [id number]   MEDICAL RECORD NO.:  [id number]          PATIENT TYPE:  INP   LOCATION:  5709                         FACILITY:  [facility]   PHYSICIAN:  [name]. Fields, MD  DATE OF BIRTH:  07/04/1946   DATE OF PROCEDURE:  09/04/2005  DATE OF DISCHARGE:  09/09/2005                                 OPERATIVE REPORT   PROCEDURE:  1.  Left femoral to below-knee popliteal bypass with reversed greater      saphenous vein.  2.  Intraoperative arteriogram x2  3.  Revision of the proximal femoral anastomosis.   PREOPERATIVE DIAGNOSIS:  Rest pain, left foot.   POSTOPERATIVE DIAGNOSIS:  Rest pain, left foot.   ANESTHESIA:  General.   ASSISTANT:  Herbert Seta, P.A.   OPERATIVE FINDINGS:  1.  Left greater saphenous vein 3.5 to 4 mm in diameter with calcification      of vein distally.  2.  Composite graft with proximal 5 cm PTFE.  3.  Moderately calcified below-knee popliteal artery.   OPERATIVE DETAIL:  After obtaining informed consent, the patient was taken  to the operating room. The patient placed supine position on the operating  table. After induction of general anesthesia and endotracheal intubation, a  Foley catheter was placed. Next, the patient's entire left lower extremity  was prepped and draped in usual sterile fashion. Longitudinal incision was  made through a preexisting scar in the left groin. The patient had had a  previous femoral to above-knee popliteal bypass with Gore-Tex many years  ago. Incision was carried down through the subcutaneous tissues down to  level of the old graft. This dissected free circumferentially. This was  occluded. Common femoral profunda femoris and superficial femoral arteries  were dissected free circumferentially and controlled with vessel loops. The  groin was quite obese and ______________.   Next, dissection was continued in the groin down to the level of the greater  saphenous vein.  The greater saphenous vein was of good quality approximately  3-1/2 to 4 mm in diameter at its proximal portion near the saphenofemoral  junction. This was harvested through several skip incisions down the leg and  below the knee. However, below the knee, the greater saphenous vein became  smaller in diameter and was heavily calcified. Next, the below-knee  popliteal artery was exposed through a medial approach after reflecting the  saphenous vein laterally. The artery was dissected free circumferentially  for approximately 5 cm. The artery was moderately calcified with islands of  calcium seen in the adventitia. Next, the patient was given 7000 units of  intravenous heparin. The vein was clipped distally and ligated with a 2-0  silk tie proximally. It was then flushed thoroughly with heparinized saline  and checked for hemostasis. Approximately 6 cm of the vein was unusable due  to heavy calcification. This was trimmed away. Next, the vein was measured  for length. The usable portion of vein was approximately 5 cm short from the  target in the popliteal artery. Therefore, it was decided that the proximal  portion of the old  femoral to above-knee popliteal bypass graft would be  used. This was clamped proximally. It was then transected. It was  thrombectomized with a #4 Fogarty catheter, and there was good inflow. Vein  was then sewn in a reversed configuration from end of vein to the end of  graft after spatulating both ends. Graft was then tunneled deep to the  sartorius. Popliteal artery was controlled proximally and distally with  peripheral DeBakey clamps. Longitudinal arteriotomy was made. The vein was  measured to appropriate length with the leg straightened. It was then  spatulated. It was then sewn end of vein to side of artery using a running 6-  0 Prolene suture. Just prior to completion anastomosis forward bled, back  bled and thoroughly flushed. Anastomosis was secured, clamps  were released.  There was flow in the popliteal artery, but it had a monophasic character to  it. An intraoperative arteriogram was obtained using a 21 gauge butterfly  needle through the proximal portion of the PTFE. This showed that there was  a twist in the vein graft up in the popliteal fossa. Therefore, the patient  was reheparinized. The vein graft was reclamped and untwisted and the distal  anastomosis redone using a running 6-0 Prolene suture. Just prior to  completion anastomosis forward bled, back bled and thoroughly flushed.  Anastomosis was secured and clamps released. The flow was significantly  improved. However, proximally there was not a very good pulse in the  proximal portion of the graft. Therefore, the proximal anastomosis of the  old PTFE was opened. There was a plug of intimal hyperplasia debris which  was acting as a ball valve, and this was removed. Excellent arterial inflow  was obtained after this. Proximal anastomosis was then redone using a  running 5-0 Prolene suture. Just prior to completion anastomosis forward  bled, back bled and thoroughly flushed. Anastomosis was secured, clamps  released, and there was good flow through the bypass graft by Doppler  examination and also a good dorsalis pedis and posterior tibial Doppler  signal. Hemostasis was obtained. Subcutaneous tissues of all incisions were  closed in multiple layers of Vicryl suture. Skin was then closed with  staples. The patient tolerated the procedure well, and there were no  complications. Instrument, sponge and needle count was correct at the end of  the case.           ______________________________  [name]. Fields, MD     CEF/MEDQ  D:  09/20/2005  T:  09/20/2005  Job:  [id number]

## 2011-03-14 NOTE — H&P (Signed)
NAMERAESHAUN, SIMSON               ACCOUNT NO.:  [id number]   MEDICAL RECORD NO.:  [id number]          PATIENT TYPE:  INP   LOCATION:  5015                         FACILITY:  [facility]   PHYSICIAN:  [name]. Fields, MD  DATE OF BIRTH:  25-Oct-1946   DATE OF ADMISSION:  03/14/2005  DATE OF DISCHARGE:                                HISTORY & PHYSICAL   PRIMARY CARE PHYSICIAN:  Jeannett Senior A. Evlyn Kanner, M.D.   CARDIOLOGIST:  Colleen Can. Deborah Chalk, M.D.   ADMISSION DIAGNOSIS:  Gangrenous right second toe.   HISTORY OF PRESENT ILLNESS:  Mr. Rosensteel is a 65 year old Caucasian male Caucasian male  with a past medical history of peripheral vascular occlusive disease and has  undergone multiple revascularizations of bilateral lower extremities. He was  recently admitted to [facility] on Mar 05, 2005, for cellulitis of  his right lower extremity. He was treated with IV Ancef and discharged home  on Mar 10, 2005.  He reports at discharge the erythema in his right lower  extremity had significantly improved and felt that he had been doing well.  However, on Mar 13, 2005, he noticed worsening discoloration of his right  second toe. He had an appointment with Dr. Adrian Prince that day and had  him evaluate his foot. At that time he reported there was a sore at the base  of his right second toe which Dr. Evlyn Kanner debrided in his office. The [facility]  office was then notified and a follow-up appointment was scheduled for Dr.  Darrick Penna on Mar 14, 2005.  He had had no fever. He did notice some drainage  from his second toe since the debridement, but was unaware of any malodorous  drainage. This morning he awoke and noted significant increase in the  discoloration of his second toe, now more blackish in appearance. When he  was evaluated by Dr. Darrick Penna in the [facility] office it was felt that he would  require admission for IV antibiotics and amputation of his right second toe.  Mr. Camarena reports Doppler studies were done of his  lower extremities which  showed his lower extremity bypass grafts were patent, however I currently  have no report of this. Of note he was discharged home with Keflex.  He does  describe the right foot as tender, but has been able to ambulate. He denies  any significant edema.  He denies chest pain or shortness of breath, but  does have dyspnea on exertion. His bilateral feet are numb which he says is  chronic from his diabetes.   PAST MEDICAL HISTORY:  1.  Peripheral vascular occlusive disease, status post right femoral-      popliteal bypass with Gore-Tex by Dr. Hart Rochester in March 2004, with      previous left femoral to popliteal bypass above the knee with Gore-Tex      in February 2003.  2.  Incision and drainage of left thigh lymphocele in March 2003.  3.  Coronary artery bypass grafting by Dr. Sheliah Plane in November 2002.  4.  Diabetes mellitus, type 2, insulin dependent.  5.  Chronic  obstructive pulmonary disease with tobacco use.  6.  Hypothyroidism.  7.  Dyslipidemia.  8.  Hiatal hernia.  9.  Hypertension.  10. Peripheral neuropathy.  11. Atrial fibrillation, on anticoagulation therapy.   ALLERGIES:  No known drug allergies.   MEDICATIONS ON ADMISSION:  1.  Keflex 500 mg p.o. t.i.d.  2.  Lasix 20 mg p.o. daily.  3.  Gemfibrozil 600 mg p.o. b.i.d.  4.  Vytorin 10/40 p.o. daily.  5.  Glyburide 4 mg one-half tablet p.o. daily.  6.  Metoprolol 50 mg p.o. daily.  7.  Avandia 4 mg p.o. daily.  8.  Synthroid 50 mcg p.o. daily.  9.  Tylox one to two tablets p.o. q.4-6h. p.r.n. pain.  10. Coumadin 5 mg daily, except 2.5 mg on Tuesday, Thursday, and Sunday.  11. Home insulin, however he is unsure of which kind and the dosage.   REVIEW OF SYSTEMS:  Please refer to history of present illness for pertinent  positives and pertinent negatives.   SOCIAL HISTORY:  He is single and lives alone. His son lives nearby and is  able to assist with care. He continues to smoke two to  three packs of  cigarettes per day and has smoked for the past 50 years. He denies regular  alcohol use.   FAMILY HISTORY:  His father died at age 78 with esophageal varices.   PHYSICAL EXAMINATION:  VITAL SIGNS: Blood pressure 153/81, heart rate 91,  respirations 18, temperature 98.8, oxygen saturation 93% on room air.  GENERAL APPEARANCE: A 65 year old Caucasian male who is alert, cooperative,  and in no acute distress.  HEENT: Head is normocephalic and atraumatic. He does wear his glasses. His  sclerae are nonicteric. His pupils are equal, round, and reactive to light  and accommodation. His oral mucosa is pink and moist. He has complete upper  dentures and is missing several teeth in his lower jaw.  NECK: Supple with no lymphadenopathy noted. He has palpable carotid pulses  and no bruits auscultated.  RESPIRATORY: His lung sounds are relatively clear. Breathing is unlabored  and symmetrical on inspiration. He does have a well healed median sternotomy  scar.  CARDIAC: His heart rate is slightly irregular. Heart sounds are faint, but  no obvious murmurs, rubs, or gallops is noted.  ABDOMEN: Soft, nontender, nondistended with normoactive bowel sounds. No  masses or hepatosplenomegaly noted.  GU/RECTAL EXAM: Deferred.  EXTREMITIES: Trace edema of the right foot.  He does have an ulceration at  the base of his second toe at the plantar surface. Here the ulceration is  small, probably about 1 cm in diameter, currently appears to have pink  tissue. There is a small amount of serosanguineous drainage. No odor noted  at this time. His entire right second toe bluish black in appearance. I am  unable to palpate dorsalis pedis or posterior tibial pulses bilaterally. He  did have 1+ femoral pulses and barely palpable radial pulses. The feet show  decreased temperature, but are relatively warm. His right second toe is  cool. NEUROLOGIC: Grossly intact. He is alert and oriented times four.  His  speech  is clear.   ASSESSMENT:  Peripheral vascular occlusive disease with gangrenous right  second toe, with recent history of cellulitis of his right lower extremity.   PLAN:  He will be admitted to [facility] on Mar 14, 2005, under the  care of Dr. Fabienne Bruns. He will be started on IV Ancef and MRI of his  right foot will be ordered to rule out osteomyelitis or abscess. It is  anticipated  that he will undergo amputation of his second right toe on Monday, Mar 17, 2005, by Dr. Fabienne Bruns. His Coumadin will be held in anticipation for  surgery. Otherwise, he will be restarted on his home medications with  addition of sliding scale insulin.      AWZ/MEDQ  D:  03/14/2005  T:  03/14/2005  Job:  [id number]

## 2011-03-14 NOTE — Op Note (Signed)
NAME:  [name], [name]                         ACCOUNT NO.:  [id number]   MEDICAL RECORD NO.:  [id number]                   PATIENT TYPE:  OIB   LOCATION:  2899                                 FACILITY:  [facility]   PHYSICIAN:  Quita Skye. Hart Rochester, M.D.               DATE OF BIRTH:  [birth year]   DATE OF PROCEDURE:  12/28/2002  DATE OF DISCHARGE:                                 OPERATIVE REPORT   PREOPERATIVE DIAGNOSIS:  Ischemic ulcer right foot secondary to severe  superficial femoral, popliteal, and tibial occlusive disease.   PROCEDURE:  Abdominal aortogram with bilateral lower extremity runoff via  right common femoral approach.   SURGEON:  Quita Skye. Hart Rochester, M.D.   ANESTHESIA:  Local Xylocaine and Versed 2 mg intravenously.   COMPLICATIONS:  None.   CONTRAST:  170 mL.   DESCRIPTION OF PROCEDURE:  The patient was taken to [facility] peripheral  endovascular laboratory and placed in the supine position, at which time  both groins were prepped with Betadine solution and draped in a routine  sterile manner.  After infiltration with 1% Xylocaine the right common  femoral artery was entered percutaneously, a guidewire passed into the  suprarenal aorta under fluoroscopic guidance.  A 5 French sheath and dilator  were passed over the guidewire, the dilator removed, and a standard pigtail  catheter positioned in the suprarenal aorta.  Flush abdominal aortogram was  performed, injecting 20 mL of contrast at 20 mL/second.  This revealed the  aorta to be widely patent proximally.  There were two left renal arteries,  the largest of which was the superior one, and had a mild to moderate (40%)  stenosis proximally.  There was a single right renal artery which had a mild  stenosis in the 30-40% range.  The aorta tapered down distally with  significant atherosclerotic changes but no severe stenosis.  The right  common iliac artery was patent but mildly irregular with a small right  internal  iliac artery.  The left internal iliac artery was occluded.  The  catheter was withdrawn into the terminal aorta and a pelvic angiogram  performed, injecting 15 mL of contrast at 15 mL/second.  This revealed the  iliac arteries to be as described with mild irregularity bilaterally but no  severe stenoses.  Bilateral lower extremity runoff was then performed,  injecting 88 mL of contrast at 8 mL/second.  This revealed the right common  femoral to have a 70% stenosis about 2 cm proximal to its origin.  The right  profunda was widely patent.  The right superficial femoral artery was  occluded in the mid thigh and reconstituted about 8 cm above the knee joint  with some mild narrowing at the knee joint.  Below the knee the popliteal  artery was smooth and there was severe disease involving all tibial vessels.  On the left side there was a patent left  femoral-popliteal bypass graft to  the above-knee popliteal artery with some mild irregularity at the toe of  the anastomosis causing some very mild narrowing.  The popliteal artery on  the left was patent across the knee joint and there was severe tibial  disease involving all three tibial vessels on the left also.  Additional  views of both popliteal arteries and the runoff in the right leg were  obtained using the peek-hold technique after removing the pigtail catheter.  When this was completed the sheath was removed, adequate compression  applied; no complications ensued.   FINDINGS:  1. Mild to moderate aortoiliac occlusive disease with no severe stenosis.  2. A 70% right common femoral stenosis.  3. Right superificial femoral occlusion with reconstitution of popliteal     artery above the knee and severe tibial disease.  4. Patent left femoral-popliteal bypass to above-knee popliteal artery with     mild to moderate stenosis at toe of anastomosis.  5. Severe left tibial disease.                                               Quita Skye  Hart Rochester, M.D.    JDL/MEDQ  D:  12/28/2002  T:  12/28/2002  Job:  [id number]

## 2011-03-14 NOTE — Discharge Summary (Signed)
NAMEPEDROHENRIQUE, [name]               ACCOUNT NO.:  [id number]   MEDICAL RECORD NO.:  [id number]          PATIENT TYPE:  INP   LOCATION:  5022                         FACILITY:  [facility]   PHYSICIAN:  [name]. Fields, MD  DATE OF BIRTH:  02-22-1946   DATE OF ADMISSION:  03/27/2006  DATE OF DISCHARGE:  04/23/2006                               DISCHARGE SUMMARY   HISTORY OF PRESENT ILLNESS:  Patient is a 65 year old Caucasian male  well known to [facility].  Patient has a history of peripheral vascular  disease status post multiple failed revascularization procedures and a  recent right below-knee amputation approximately 1 month prior to this  admission.  The patient was seen on the date of admission in the office  for evaluation of his below-knee amputation and it was noted that it was  not felt to be healing well.  The patient had been doing daily dressing  changes since the time of his amputation.  The staples were removed in  the office by Dr. Darrick Penna and the wound was debrided and the patient was  felt to require admission for further wound treatment and possible above-  knee amputation.  The patient complained that the right lower extremity  laceration had a significant drainage and odor.  There was also noted to  be erythema.  He denied any significant pain in the incision.  He was  admitted to the hospital for further wound care treatment.   PAST MEDICAL HISTORY:  Includes the following:  1. Peripheral vascular disease status post right below-knee      amputation.  2. A history of occluded right axial femoral bypass.  3. Chronic atrial fibrillation.  4. Peripheral vascular occlusive disease status post bilateral      peripheral revascularizations.  5. Coronary artery disease status post coronary artery bypass      grafting.  6. Diabetes mellitus type 2.  7. Hypothyroidism.  8. Hyperlipidemia.  9. Hypertension.  10.A history of hiatal hernia.  11.A history of myopathy.  12.A  history of acute/chronic renal insufficiency.   PAST SURGICAL HISTORY:  Includes:  1. Thrombectomy of right fem-pop bypass graft with an intraoperative      arteriogram.  2. Right axial femoral bypass.  3. Right below-knee amputation.  4. Coronary artery bypass grafting.  5. A history of amputation of right great toe.   ALLERGIES:  NO KNOWN DRUG ALLERGIES.   MEDICATIONS PRIOR TO ADMISSION:  1. Hydrochlorothiazide 12.5 mg daily.  2. Vytorin 10/40 one q.h.s.  3. Multivitamin one daily.  4. Lopid 600 mg daily.  5. Amaryl 2 mg daily.  6. Avandia 2 mg daily.  7. Lantus 28 units q.h.s.  8. Colace 100 mg daily.  9. Lopressor 50 mg b.i.d.  10.Coumadin 5 mg q. Monday and Friday.  11.Coumadin 2.5 mg q. Tuesday, Thursday, Saturday, Sunday, and      Wednesday.  12.He is also on a sliding scale insulin and took Tylox intermittently      on a p.r.n. basis for pain.   FAMILY/SOCIAL HISTORY/REVIEW OF SYMPTOMS/PHYSICAL EXAM:  Please see the  history  and physical done on admission.   HOSPITAL COURSE:  Patient was taken to the operating room and underwent  debridement of the right below-amputation on March 30, 2006.  He tolerated  that procedure well and was taken to the Postanesthesia Care Unit.   POSTOPERATIVE HOSPITAL COURSE:  Patient has been treated with aggressive  wound management, including initially dressing changes and several  debridements.  He subsequently was placed in the [facility] wound care device  for further assistance with granulation.  He was followed medically for  his diabetes management by Dr. Evlyn Kanner.  He was also kept on heparin for  his history of atrial fibrillation.  As the wound improved, he was  subsequently re-anticoagulated with Coumadin.  On April 23, 2006, the  wound was partially closed and he was felt to be stable for discharge  for further management as an outpatient.   MEDICATIONS ON DISCHARGE:  Were as preoperatively initially.  There was  a change in his Lantus  insulin to 57 units q.h.s.  His Coumadin was to  be 3 mg daily, and he was instructed to get a PT/INR on the following  Wednesday or Thursday of the next week.  New medications included  Levaquin 500 mg daily for 2 weeks.  Initially he was given a  prescription for Tylox 1 or 2 every 4-6 hours as needed.   FOLLOWUP:  Included Dr. Darrick Penna in 1 week.   CONDITION ON DISCHARGE:  Stable and improving.   FINAL DIAGNOSES:  Infected right below-knee amputation status post  debridement and aggressive wound management with partial closure.  Other  diagnoses as previously listed per the history.      Rowe Clack, P.A.-C.      [name]. Fields, MD  Electronically Signed    WEG/MEDQ  D:  09/23/2006  T:  09/24/2006  Job:  [id number]   cc:   [name]. Darrick Penna, MD

## 2011-03-14 NOTE — Assessment & Plan Note (Signed)
OFFICE VISIT   RUDOLF, BLIZARD  DOB:  Apr 22, 1946                                       01/19/2008  EAVWU#:[id number]   The patient underwent aortogram with lower extremity runoff on March 20.  This showed a diffusely diseased right external iliac artery as well as  occlusion of the superficial femoral and profundus femoris arteries near  the origin in the right leg.  He has a nonhealing wound of his right BKA  stump.  Although technically feasible to do an angioplasty of his  external iliac artery, I do not believe that this would be beneficial to  his stump since he has distal occlusion of his common femoral artery at  the bifurcation as well.  The profunda femoris to superficial femoral  arteries are also quite small.  Of note, his left lower extremity  bypass, the left femoral popliteal bypass was patent at the proximal and  distal anastomoses with a moderate amount of tibial disease below this  with no flow-limiting stenosis in the graft itself.  As far as his left  leg is concerned, he will continue in our vein graft surveillance  protocol.  As far as his right leg is concerned, if the ulcer continues  to develop over time, I believe the best option would be an above knee  amputation, and I discussed this with him and his family at length.  I  do not believe a revascularization operation is in his best interest to  salvage a below knee amputation alone, and that the risk of the  operation does not outweigh the benefit.   [name]. Fields, MD  Electronically Signed   CEF/MEDQ  D:  01/19/2008  T:  01/20/2008  Job:  [id number]

## 2011-03-14 NOTE — Discharge Summary (Signed)
[place]  Patient:    [name], [name] Visit Number: [id number] MRN: [id number]          Service Type: SUR Location: 3300 [id number] Attending Physician:  Colvin Caroli Dictated by:   Loura Pardon, P.A. Admit Date:  12/01/2001 Discharge Date: 12/03/2001                             Discharge Summary  DATE OF BIRTH:  12/11/1945  PRIMARY CARE GIVER:  Tera Mater. Evlyn Kanner, M.D.  CARDIOLOGIST:  Colleen Can. Deborah Chalk, M.D.  DISCHARGE DIAGNOSES: 1. Claudication bilaterally in the lower extremities. 2. Severe inferoinguinal arterial occlusive disease. 3. Non-healing fissure in the left heel. 4. Atrial fibrillation with rapid ventricular rate postoperatively.  SECONDARY DIAGNOSES: 1. History of atherosclerotic coronary artery disease status post coronary    artery bypass graft surgery 09/15/01. 2. Type 2 diabetes mellitus. 3. Chronic atrial fibrillation/chronic Coumadin therapy. 4. Hypothyroidism. 5. Hypertension. 6. Status post left knee arthroscopy secondary to torn cartilage in 1984.  PROCEDURE:  On December 01, 2001, left femoral to above knee popliteal artery bypass with placement of Gore-Tex conduit, Dr. Quita Skye. Hart Rochester, Careers adviser.  The patient was transferred in stable satisfactory condition to the recovery room. He awoke without neurologic deficit.  DISCHARGE DISPOSITION:  [name] is judged suitable for discharge on postoperative day #2.  He has been afebrile in the postoperative period.  His mental status has been clear.  He is ambulating independently.  His pain is controlled well with just oral analgesics.  He was started on his Coumadin loading doses on postoperative day #1 and received his second dose on postoperative day #2.  His incisions are healing nicely without evidence of erythema or drainage.  He has a palpable dorsalis pedis pulse and the left foot is well-perfused and warm.  The graft has remained patent.   [name] was seen by Dr. Patty Sermons, a colleague of Dr. Roger Shelter, on postoperative day #1 for prolonged tachycardia with wide complex conduction. This was found to be atrial fibrillation with rapid ventricular rate and in addition to his amiodarone, he was restarted on Lopressor low dose with a decrease in heart rate from the 113-120s to the 80s-90s.  At the lower rate, his atrial fibrillation was clearly recognizable as such.  He has been cleared for discharge by both Dr. Deborah Chalk and by Dr. Hart Rochester.  He goes home on the following medications.  DISCHARGE MEDICATIONS:  1. Percocet 5/325, 1-2 tabs p.o. q.4-6h p.r.n. pain.  2. Coumadin 2.5 mg daily, which is one half of a 5 mg tab.  3. Lotensin 10 mg daily.  4. Glucovance 5/500 twice daily.  5. Synthroid 50 mcg daily.  6. Amiodarone 200 mg twice daily.  7. Centrum Silver daily.  8. Lopressor 50 mg one half tab in the morning, one half tab in the     evening.  9. Folic acid 1 mg daily. 10. Multivitamin daily.  DISCHARGE ACTIVITY:  He is encouraged to walk daily to build up his strength. He is asked not to drive until he sees Dr. Hart Rochester.  DISCHARGE DIET:  Low sodium, low cholesterol ADA diet.  [name] may shower beginning Tuesday, February 11.  He is to place a dry 4 x 4 gauze in the incision at the left groin for the next 3-4 days.  He has an office visit with Dr. Deborah Chalk on Monday,  February 10 between 8 and 4 to have his blood checked, to check the PT/INR with a target range between 2 and 2.5.  He will then also see Dr. Deborah Chalk Monday, December 13, 2001 at 3 oclock in the afternoon, and he will see Dr. Hart Rochester in followup Tuesday, December 21, 2001 at 9 oclock in the morning.  At that time, further surgery involving the right lower extremity will be discussed.  [name] was also encouraged to keep the left heel off of the bed with a pillow when at rest.  BRIEF HISTORY:  [name] is a 65 year old male with a  history of a non-healing ulcer in the left heel which has been present there for approximately 6-7 months.  [name] gives a history of bilateral calf claudication which limits his walking to 10-20 feet at a time before pain sets in.  He also relates some numbness of his feet, especially in his toes bilaterally.  He was referred by Dr. Evlyn Kanner to Dr. Hart Rochester for further evaluation.  He underwent an arteriogram which showed an occluded left internal iliac artery stenosis of the right internal iliac artery, occlusion of the left superficial femoral artery with reconstitution in an occluded mid thigh right superficial femoral artery also with reconstitution.  It is felt that he is a good candidate for surgical revascularization but in the course of preoperative workup, he was also found to have significant atherosclerotic coronary artery disease.  It was elected to proceed with coronary artery bypass surgery 11/02 by Dr. Sheliah Plane prior to attempting a peripheral revascularization.  He has done well since his coronary artery surgery but does have symptoms of claudication.  He denies buttock, hip, thigh pain and has only occasionally had rest pain.  He has no significant peripheral edema and notes no temperature change in the lower extremities.  The left heel ulcer is present but he has no other non-healing areas or ischemic changes of his feet or legs.  Now that he has recovered well from cardiac surgery, it is recommended that he proceed with peripheral revascularization.  HOSPITAL COURSE:  As described in the discharge disposition. Dictated by:   Loura Pardon, P.A. Attending Physician:  Colvin Caroli DD:  12/03/01 TD:  12/06/01 Job: [id number]

## 2011-03-14 NOTE — Op Note (Signed)
NAMETRYSTEN, BERNARD               ACCOUNT NO.:  [id number]   MEDICAL RECORD NO.:  [id number]          PATIENT TYPE:  INP   LOCATION:  1825                         FACILITY:  [facility]   PHYSICIAN:  Quita Skye. Hart Rochester, M.D.  DATE OF BIRTH:  05-16-1946   DATE OF PROCEDURE:  04/26/2006  DATE OF DISCHARGE:  04/26/2006                                 OPERATIVE REPORT   PREOPERATIVE DIAGNOSIS:  Dehiscence of right below-knee amputation stump.   POSTOPERATIVE DIAGNOSIS:  Dehiscence of right below-knee amputation stump.   OPERATION:  Reclosure of right below-knee amputation stump.   SURGEON:  Dr. Josephina Gip.   ANESTHESIA:  General LMA.   COMPLICATIONS:  None.   DESCRIPTION OF PROCEDURE:  The patient was taken to the operating room,  placed in supine position at which time satisfactory LMA general anesthesia  was administered.  The right below-knee amputation stump had dehisced  earlier in the day when the patient fell in the bathroom, the patient having  been performed only 3 days ago.  After prepping and draping in routine  sterile manner, all the previous nylon and Vicryl sutures were removed.  The  stump skin edges appeared viable and the deep tissue had no evidence of  infection.  After thoroughly prepping this with Betadine and irrigating with  saline, it was closed in two layers with interrupted zero Vicryl for the  deep layer and interrupted #0 Prolene vertical mattress sutures for the  skin.  A sterile dressing was applied consisting of 4x4s, ABD pad and an Ace  wrap and the patient taken to the recovery room in satisfactory condition.           ______________________________  Quita Skye Hart Rochester, M.D.     JDL/MEDQ  D:  04/26/2006  T:  04/27/2006  Job:  [id number]

## 2011-03-14 NOTE — Discharge Summary (Signed)
[place]  Patient:    [name], [name] Visit Number: [id number] MRN: [id number]          Service Type: SUR Location: 3300 [id number] [id number] Attending Physician:  Colvin Caroli Dictated by:   Loura Pardon, P.A. Admit Date:  12/01/2001 Discharge Date: 12/03/2001                             Discharge Summary  ADDENDUM  DATE OF BIRTH:  1946/03/01  One medication has to be added to the medications list, and that would be medication #11, Lantus 30 units injected subcutaneously every evening.Dictated y:   Loura Pardon, P.A. Attending Physician:  Colvin Caroli DD:  12/03/01 TD:  12/06/01 Job: [id number]

## 2011-03-14 NOTE — Consult Note (Signed)
[name], [name]               ACCOUNT NO.:  [id number]   MEDICAL RECORD NO.:  [id number]          PATIENT TYPE:  INP   LOCATION:  2550                         FACILITY:  [facility]   PHYSICIAN:  [name], M.D.    DATE OF BIRTH:  Mar 24, 1946   DATE OF CONSULTATION:  02/07/2006  DATE OF DISCHARGE:                                   CONSULTATION   INDICATIONS FOR CONSULTATION:  Atrial fibrillation, rapid ventricular  response, hypotension.   [name] is a 65 year old male with a past medical history of  peripheral vascular disease, status post multiple revascularization  procedures and revision. He presented to [facility] with complaints of three-week  history of right cold foot and ischemia.  He was taken emergently for a  thrombectomy with redo bypass. Midway through the procedure he developed  hypertension and right ventricular response on his atrial fibrillation. His  rate was in the 110s to 120s. He was given fresh frozen plasma times two. At  some point he was started on Cardizem for ventricular response of 150s to  170s. Systolic blood pressure was in the 90s and he was subsequently started  on dopamine at 6 mcg/kg and Neo-Synephrine at 10 mcg per minute. [name]  has a past medical history of ischemic cardiomyopathy, status post coronary  artery bypass graft surgery in 2002. His last documented ejection fraction  was in 2002 and at this time it was noted to be 30% to 40%. He is unable to  recall his last visit to Dr. Ronnald Nian office. He has had no complaints of  orthopnea or pedal edema prior to his preoperative period. He states that  his atrial fibrillation was rate controlled.   PAST MEDICAL HISTORY:  Significant for peripheral vascular disease, coronary  artery disease as noted above, type 2 diabetes mellitus, hypothyroidism,  chronic obstructive pulmonary disease, dyslipidemia, chronic  anticoagulation, and ongoing tobacco use. The patient states that he stopped  smoking again in 2006.   PAST SURGICAL HISTORY:  As noted above.   MEDICATIONS PRIOR TO THIS VISIT:  1.  Amaryl 2 mg daily.  2.  Lasix 20 mg on white male.  3.  Avandia 12 mg daily.  4.  Metoprolol 50 mg b.i.d.  5.  Benazepril/hydrochlorothiazide 10/12.5.  6.  Vytorin 10/40.  7.  Gemfibrozil 600 mg daily.  8.  Synthroid 50 mcg daily.  9.  Multivitamin.  10. Augmentin 500 mg b.i.d.  11. Coumadin 5 mg on Monday, Wednesday, and Friday, 2.5 mg on Saturday,      Tuesday, Thursday, and Sunday.  12. Lantus insulin 20 units q.h.s.   SOCIAL HISTORY:  The patient is widowed. He has two children. Previously  owned a Teaching laboratory technician. No history of current alcohol use; prior to his  bypass significant alcohol use.   FAMILY HISTORY:  Father passed at age 33. Mother is age 55.  He has two  living sisters in good health.   PHYSICAL EXAMINATION:  GENERAL: An elderly male currently in no acute  distress. Current complaints is tingling in his right hand. He is alert and  oriented. [name] is appropriate.  VITAL SIGNS: Systolic blood pressure 100/50, telemetry revealing atrial  fibrillation with a rate of 100 to 110, O2 saturation 97% on three liters.  This was on dopamine at 6 mcg and Neo-Synephrine at 15.  NECK: There is a deep line in his right IJ, unable to assess for neck vein  distention.  PULMONARY: Breath sounds are equal anteriorly. There is mild expiratory  wheezing noted.  CARDIOVASCULAR: An irregular rhythm. Heart tones are distant.  ABDOMEN: Morbidly obese. No audible bowel sounds.  EXTREMITIES: Feet are warm. There is no peripheral edema.  NEUROLOGIC: Nonfocal.   LABORATORY DATA:  White count 15.2, hematocrit 28.7, INR 2.3. Sodium 149,  potassium 4.7, CO2 18, glucose 277, creatinine 1.8, calcium 7.1.  Initial CK  is 141, troponin-I 0.02.   EKG reveals a right bundle branch block. There are Q-waves in the inferior  leads which has been previously noted. There are  nonspecific ST changes  noted.   IMPRESSION:  1.  Atrial fibrillation, chronic, with rapid ventricular response. At this      point would discontinue his Cardizem as well as contributing to his      hypotension.  Initiate amiodarone and digoxin per pharmacy protocol.      Continue with Coumadin and maintain INR of 2 to 3.  2.  Hypotension. The etiology is unclear. Will obtain a transthoracic      echocardiogram. Would hold his antihypertensive medications.  3.  Ischemic cardiomyopathy. Will need to monitor his fluids closely, strict      I&Os, and the patient is currently receiving packed red cells for his      anemia.  4.  Renal insufficiency. Creatinine is stable.   Thank for allowing me to participate in the care of your patient. I will  follow the patient with you.      [name], M.D.  Electronically Signed     HP/MEDQ  D:  02/07/2006  T:  02/07/2006  Job:  [id number]   cc:   [name]. [name], M.D.  Fax: [phone]

## 2011-03-14 NOTE — Discharge Summary (Signed)
[name], [name]               ACCOUNT NO.:  [id number]   MEDICAL RECORD NO.:  [id number]          PATIENT TYPE:  INP   LOCATION:  2002                         FACILITY:  [facility]   PHYSICIAN:  [name]. Fields, MD  DATE OF BIRTH:  [birth year]   DATE OF ADMISSION:  02/06/2006  DATE OF DISCHARGE:                                 DISCHARGE SUMMARY   PRIMARY ADMITTING DIAGNOSIS:  Ischemic right lower extremity.   ADDITIONAL/DISCHARGE DIAGNOSES:  1.  Ischemic right lower extremity.  2.  Occluded right axillofemoral bypass.  3.  Gangrenous right third toe.  4.  Nonhealing right foot wound.  5.  Chronic atrial fibrillation.  6.  History of peripheral vascular occlusive disease, status post bilateral      peripheral revascularization.  7.  History of coronary artery disease status post coronary artery bypass      graft.  8.  Type 2 diabetes mellitus.  9.  Hypothyroidism.  10. Hyperlipidemia.  11. Hypertension.  12. Chronic obstructive pulmonary disease.  13. History of hiatal hernia.  14. Peripheral neuropathy.  15. Bilateral renal artery stenoses by CT angiogram in October 2006.  16. Acute on chronic renal insufficiency.  17. Postoperative blood loss anemia.   PROCEDURES PERFORMED:  1.  Thrombectomy of right femoral popliteal bypass graft with intraoperative      arteriogram.  2.  Right axillofemoral bypass graft.  3.  Thrombectomy of right axillofemoral bypass.  4.  Right foot debridement with right third toe amputation.  5.  Right below-knee amputation.   HISTORY:  The patient is a 65 year old male who is well-known to [facility].  He  has a history of peripheral vascular occlusive disease and is status post  multiple peripheral revascularization procedures.  On the date of this  admission, he was seen in the [facility] office with a three-week history of cold  right foot.  He was found on duplex scan to have an occluded right femoral  to popliteal bypass graft.  Because of this, he  was admitted to [facility] for operative intervention.   HOSPITAL COURSE:  [name] was admitted to [facility].  Because  of his history of chronic atrial fibrillation on Coumadin, he did require  vitamin K and fresh frozen plasma prior to surgery to correct his INR.  He  was taken to the operating room on February 06, 2006 where he underwent right  femoral to popliteal bypass thrombectomy with an intraoperative arteriogram.  This was unsuccessful in restoring flow to his foot and he ultimately  required a right axillofemoral bypass.  At the conclusion of the procedure,  he had Doppler signals distally.  He was transferred to the 3300 units in  stable condition.  He developed rapid atrial fibrillation and  postoperatively and required diltiazem drip.  He also was hypotensive and  required dopamine and Neo-Synephrine drip.  A cardiology consult was  obtained and Dr. Fraser Din saw the patient, stopped his Cardizem and started  him on digoxin and amiodarone.  His heart rate stabilized on the amiodarone  and digoxin.  His BP  also stabilized.  However, over the first 48 hours  postoperatively, his axillofemoral bypass occluded and he was returned to  the operating room on February 08, 2006 for thrombectomy.  Intraoperatively, no  arterial narrowing was noted and it was felt that the occlusion was most  likely secondary to his hypotension.  He was returned to the floor in stable  condition.  From a cardiac standpoint, he stabilized and since that time has  remained in rate-controlled atrial fibrillation.  From a post surgical  standpoint, however, his right foot remained gangrenous in appearance  despite adequate circulation.  He was returned to the operating room and  underwent debridement of his right foot with amputation of his third toes.  It was felt at that time that he would likely need a right below-knee or  right above-knee amputation if this did not continue to heal.  He  was  started on antibiotics and local wound care to the foot.  Despite aggressive  treatment, his toe amputation site showed minimal granulation tissue.  Because the patient was not having any unrelenting rest pain or frank  purulence at that this time, it was felt that a VAC should be attempted  prior to proceeding with amputation.  Wound care nurse was consulted and a  VAC was placed on his right foot.  This was left in place for 48 hours and  upon the first dressing change he was noted to have no significant  improvement with minimal granulation tissue and continued necrosis of the  area.  He was switched back to saline wet-to-dry dressings and this was  observed further.   The patient began to have worsening foot pain and it was determined that he  would likely need to proceed with an amputation.  He was returned to the  operating room on February 23, 2006 and underwent a right below-knee  amputation.  He tolerated the procedure well and was returned to the floor  in stable condition.  At this point, his right below-knee amputation site is  healing with small amount of erythema just anterior to the incision line.  His groin and chest staples have been removed from his axillofemoral bypass  and all this is healing fairly well.  His only postoperative complication  has been an acute on chronic renal insufficiency.  He was initially treated  conservatively with discontinuation of his ACE inhibitor, his Lasix and his  potassium and he was hydrated.  However, in spite of these measures, his  creatinine bumped up to 4.1.  Nephrology consult was obtained and the  patient was seen by Dr. Terrial Rhodes.  It was felt that his renal  failure was probably multifactorial most likely due to an acute tubular  necrosis.  His fluids were discontinued and he was observed closely.  His  urine output remained excellent during this time and his creatinine began to trend back downward.  At presently, he  has stabilized at 1.5 with a BUN of  28.  He has otherwise done well and remained stable during his  hospitalization.  He remains in rate-controlled atrial fibrillation and has  been maintained on heparin drip until his Coumadin can be restarted.  At  this point, his Coumadin has been restarted and he has presently being  titrated upward to a maintenance dose.  At one point his platelet count  dropped and it was thought that he might have a heparin-induced  thrombocytopenia; however, an HIT panel was obtained and this was  negative.  Since then his platelets have improved and are currently back to baseline.  Postoperatively, he has also had a mild anemia and was treated with Epogen  by nephrology service.  He is remaining stable at this point with a  hemoglobin of 8.5 and hematocrit of 24.2.  Otherwise, he has done well.  His  pain is well-controlled on p.o. pain medication.  He has been working with  physical therapy on mobility issues.  Dr. Evlyn Kanner has been monitoring his  blood sugars and his diabetes medications.  His blood pressures have  remained stable off his ACE inhibitor and it is felt by the nephrology  service that if the BP starts to trend upward again he could restart his ACE  at a lower dose and his creatinine could continue to be observed.  He has  remained afebrile and all other vital signs have been stable.  His most  recent labs on Feb 27, 2006 show a hemoglobin of 8.5, hematocrit 24.2, white  count 11.3, platelets 144, sodium 136, potassium 4.2, BUN 28, creatinine  1.5.  A PT and INR are ordered for the morning of Feb 28, 2006 to monitor his  anticoagulation.  He has been evaluated by [facility] service and  it is felt that he will require some further inpatient work on  reconditioning prior to discharge home.  At this point, he is medically  stable and ready for transfer once a bed is available.   DISCHARGE MEDICATIONS:  1.  HCTZ 12.5 mg daily.  2.  Vytorin  10/40 q.h.s.  3.  Multivitamin one daily.  4.  Lopid 600 mg daily.  5.  Amaryl 2 mg daily.  6.  Avandia 2 mg daily.  7.  Lantus 28 units q.h.s.  8.  Colace 100 mg daily.  9.  Lopressor 50 mg b.i.d.  10. Lanoxin 0.125 mg daily.  11. Coumadin 5 mg on Monday and Friday and 2.5 mg on Tuesday, Wednesday,      Thursday, Saturday and Sunday with any changes to be made based on the      pro times.  12. Tylox 1-2 q.4h. p.r.n. for pain.  13. Sliding scale insulin as directed.   DISCHARGE INSTRUCTIONS:  He will continue with PT and OT as directed.  He  will continue a low-fat, low-sodium diet with diabetic restrictions.   WOUND CARE:  His groin and stump incisions are to be cleaned daily with soap  and water.  A dry gauze dressing is to be kept in his right groin at all  times.  His right stump is to be dressed daily with 4x4s and wrapped with  Kerlix and an Ace wrap.   DISCHARGE FOLLOWUP:  At the time of his discharge from the rehab service, he will need to have an appointment to see Dr. Darrick Penna in approximately one  month.  His staples from his amputation site will be removed approximately  one month from the time of discharge but [facility] will check on him while he is  on the rehab floor to make sure that this is healing adequately.  He will  also need outpatient follow up with Dr. Arrie Aran or one of his  associates to monitor his chronic kidney disease and bilateral renal artery  stenosis and they recommend follow up in 1-2 months.  Also he would need to  have a pro time monitored and a cardiology follow up at the time of his  discharge from rehab.  Coral Ceo, P.A.      [name]. Fields, MD  Electronically Signed    GC/MEDQ  D:  02/27/2006  T:  02/28/2006  Job:  [id number]   cc:   Terrial Rhodes, M.D.  Fax: [phone]   Tera Mater. Evlyn Kanner, M.D.  Fax: [phone]   Colleen Can. Deborah Chalk, M.D.  Fax: [phone]   [facility] office

## 2011-03-14 NOTE — Op Note (Signed)
[name]  Patient:    [name], [name] Visit Number: [id number] MRN: [id number]          Service Type: DSU Location: [facility] 2899 16 Attending Physician:  Colvin Caroli Dictated by:   Quita Skye Hart Rochester, M.D. Proc. Date: 09/09/01 Admit Date:  09/09/2001   CC:          Cath Lab   Operative Report  PREOPERATIVE DIAGNOSIS:  Nonhealing ulcer, left heel, secondary to femoropopliteal and tibial occlusive disease.  POSTOPERATIVE DIAGNOSIS:  PROCEDURE:  Abdominal aortogram with bilateral lower extremity runoff via right common femoral approach with selective second ordered catheterization of left common iliac artery.  SURGEON:  Quita Skye. Hart Rochester, M.D.  ANESTHESIA:  Local Xylocaine and intravenous sedation with 2 mg of Versed.  COMPLICATIONS:  None.  DESCRIPTION OF PROCEDURE:  Patient was taken to the [facility] Lab and placed in the supine position, at which time both groins were prepped with Betadine solution and draped in routine sterile manner. After infiltration of 1% Xylocaine, the right common femoral artery was entered percutaneously and guidewire passed into the suprarenal aorta under fluoroscopic guidance.  A 5-French sheath and dilator were passed over the guidewire, dilator removed and a standard pigtail catheter positioned in the suprarenal aorta.  A flush abdominal aortogram was performed, injecting 20 cc of contrast at 20 cc/sec, and this revealed the aorta to be small but widely patent.  There were two renal arteries bilaterally with no significant stenoses.  The iliac arteries were widely patent but there was severe disease of the internal iliac arteries with occlusion on the left side and a severe stenosis of the origin of the right internal iliac artery.  Catheter was withdrawn into the terminal aorta and bilateral lower extremity runoffs performed, injecting 80 cc of contrast at 8 cc/sec.   This revealed the right common femoral artery to have an approximate 50% stenosis distally.  The right superficial femoral artery was diffusely diseased and occluded in the mid-thigh, with reconstitution above the knee in the popliteal artery and three-vessel runoff on the right through the anterior tibial artery, which was the best, with some disease in the tibioperoneal trunk, but the posterior tibial and the peroneal arteries on the right being patent.  On the left side, the femoral artery was widely patent.  The superficial femoral artery was diffusely diseased and occluded in the mid-thigh, with reconstitution above the knee and two-vessel runoff on the left via the anterior tibial and peroneal arteries.  The anterior tibial artery had significant disease about 4 cm distal to its origin.  To get better view of the left leg, the left common iliac artery was then selectively catheterized with an IMA catheter and using the peek hold technique, better views of the above-knee popliteal artery and runoff on the left were obtained.  Having tolerated the procedure well, the catheter was removed over the guidewire, the sheath removed, adequate compression applied and no complications ensued.  FINDINGS: 1. Small aorta with no significant stenosis in aortoiliac system. 2. Paired renal arteries bilaterally, widely patent. 3. Occlusion of left internal iliac artery. 4. Stenosis, origin of right internal iliac artery. 5. Left superficial femoral occlusion with reconstitution, above-knee    popliteal artery, and two-vessel runoff through anterior tibial and    peroneal arteries. 6. Occlusion, mid-thigh right superficial femoral artery, with reconstitution,    above-knee popliteal artery, and three-vessel runoff on the right. Dictated by:   Fayrene Fearing  Marlynn Perking, M.D. Attending Physician:  Colvin Caroli DD:  09/09/01 TD:  09/09/01 Job: [id number]

## 2011-03-14 NOTE — Op Note (Signed)
NAME:  TAILOR, WESTFALL                         ACCOUNT NO.:  [id number]   MEDICAL RECORD NO.:  [id number]                   PATIENT TYPE:  INP   LOCATION:  2899                                 FACILITY:  [facility]   PHYSICIAN:  Quita Skye. Hart Rochester, M.D.               DATE OF BIRTH:  1946/08/13   DATE OF PROCEDURE:  12/29/2002  DATE OF DISCHARGE:                                 OPERATIVE REPORT   PREOPERATIVE DIAGNOSIS:  Ischemic ulcer, right first toe, secondary to  severe femoral and popliteal and tibial occlusive disease.   POSTOPERATIVE DIAGNOSIS:  Ischemic ulcer, right first toe, secondary to  severe femoral and popliteal and tibial occlusive disease.   OPERATION:  The right common femoral to popliteal (below knee) bypass using  a 6-mm Gore-Tex graft with intraoperative arteriogram.   SURGEON:  Quita Skye. Hart Rochester, M.D.   FIRST ASSISTANT:  [name], P.A.   ANESTHESIA:  General endotracheal.   DESCRIPTION OF PROCEDURE:  The patient was taken to the operating room and  placed in the supine position, at which time satisfactory general  endotracheal anesthesia was administered.  Both legs were prepped with  Betadine scrubbing solution and draped in a routine sterile manner.  A  medial incision was made above the knee on the right leg to expose the above-  knee popliteal artery distal to the adductor canal.  It was a severely  diseased and heavily calcified vessel even though it was patent on the  angiogram.  It was not felt suitable for grafting.  Therefore, the incision  was extended and the popliteal artery was exposed below the knee, where it  was softer but still diffusely diseased.  The patient was known to have  severe tibial disease in all vessels.  The popliteal artery was dissected  free below the knee.  An oblique incision was then made in the right  inguinal region and the vessels in this area were also all severely diseased  with calcific atherosclerosis.  The external  iliac was exposed beneath the  inguinal ligament.  The common, superficial, and profunda femoris arteries  were controlled.  There was diffuse disease throughout the common femoral.  There was a heavily calcified posterior plaque extending up as far  proximally as could be palpated.  It was not felt to be optimal to remove  contralateral leg vein for this because of the severely diseased vessels in  the inguinal region and not a good situation for an endarterectomy and the  patient desired not to have the vein from the left leg removed if at all  possible.  The right leg had already been harvested for heart surgery.  Therefore, it was decided to use a 6-mm PTFE Gore-Tex graft, which was  tunneled appropriately, and the patient was heparinized.  The popliteal  artery was occluded proximally and distally, opened with the #15 blade,  extended with the Potts  scissors.  It was diffusely diseased but widely  patent below the knee.  The graft was spatulated and anastomosed end-to-side  with 6-0 Prolene.  The clamps were then released and attention turned to the  inguinal region, where the vessels were occluded with vascular clamps with  the external iliac being occluded proximally.  A longitudinal opening was  made in the proximal common femoral artery with the #15 blade, extended with  the Potts scissors.  There was a soft spot anteriorly on the artery but it  was diffusely and severely diseased posteriorly.  End-to-side anastomosis  was done between Gore-Tex and artery with 6-0 Prolene, clamps released, and  there was a good pulse in the graft distally and much improved Doppler flow  in the tibial vessels in the foot.  Intraoperative  arteriogram revealed a widely patent anastomosis with runoff through three  diseased tibial vessels.  No Protamine was given.  The wound was irrigated  with saline, closed in layers with Vicryl and clips.  A sterile dressing  applied.  The patient taken to the  recovery room in satisfactory condition.                                               Quita Skye Hart Rochester, M.D.    JDL/MEDQ  D:  12/29/2002  T:  12/29/2002  Job:  [id number]

## 2011-03-14 NOTE — H&P (Signed)
[name], [name]               ACCOUNT NO.:  [id number]   MEDICAL RECORD NO.:  [id number]          PATIENT TYPE:  INP   LOCATION:  3020                         FACILITY:  [facility]   PHYSICIAN:  Di Kindle. Edilia Bo, M.D.DATE OF BIRTH:  05/16/1946   DATE OF ADMISSION:  03/05/2005  DATE OF DISCHARGE:                                HISTORY & PHYSICAL   REASON FOR ADMISSION:  Cellulitis of the right leg.   HISTORY:  This is a 65 year old gentleman who has undergone multiple  previous revascularization procedures.  He had a right fem-pop bypass graft  by Dr. Hart Rochester in March of 2004.  He later developed a wound infection in the  distal incision below the knee.  In September of 2004 the fem-pop bypass  graft occluded.  At that time he underwent right femoral endarterectomy with  revision of the proximal anastomosis and patch angioplasty of the distal  anastomosis.  He had presented to the office on January 24, 2005 with  increasing pain of the right foot which was now ischemic.  This had been  going on for three to five days.  On January 29, 2005 he underwent thrombectomy  and revision of the proximal anastomosis of a right fem-pop bypass graft by  Dr. Darrick Penna.  He developed some cellulitis of the right leg several days ago  and this has failed to respond to the antibiotics which he states that he  has been taking, although we have no records of what antibiotic he is on.  He presented to the office today with significant cellulitis of the right  leg.   PAST MEDICAL HISTORY:  1.  Significant for coronary artery disease.  He underwent coronary      revascularization by Dr. Tyrone Sage in November of 2002.  2.  In addition, he has a history of insulin-dependent diabetes.  3.  In addition, he has chronic obstructive pulmonary disease.  4.  Hypothyroidism.  5.  Dyslipidemia.  6.  Hypertension.  7.  Atrial fibrillation.   SOCIAL HISTORY:  He continues to smoke two to three packs of cigarettes  per  day.   FAMILY HISTORY:  There is no history of premature cardiovascular disease.   MEDICATIONS:  1.  Lasix 20 mg p.o. daily.  2.  Gemfibrozil 600 mg p.o. b.i.d.  3.  Vytorin 10/40 p.o. daily.  4.  Glyburide 4 mg half tablet p.o. daily.  5.  Metoprolol 50 mg p.o. daily.  6.  Avandia 4 mg p.o. daily.  7.  Synthroid 50 mcg p.o. daily.  8.  Coumadin 2.5 mg p.o. daily.   REVIEW OF SYSTEMS:  GENERAL:  He has had no fever at home that has been  documented.  He has had no weight loss, weight gain, or problems with his  appetite.  CARDIAC:  He has had no chest pain, chest pressure.  PULMONARY:  He does have a history of COPD and he continues to smoke.  He has had no  recent bronchitis, asthma, or wheezing.  GI:  He has had no recent change in  his bowel habits.  GU:  He has no dysuria or frequency.  NEUROLOGIC:  He has  had no history of stroke, TIAs, or amaurosis fugax.  HEMATOLOGIC:  He has  had no bleeding problems or clotting disorders.  Review of systems is  otherwise unremarkable.   PHYSICAL EXAMINATION:  VITAL SIGNS:  Temperature 101.5, blood pressure  108/80.  HEENT:  No carotid bruits.  LUNGS:  Clear bilaterally to auscultation.  CARDIAC:  Regular rate and rhythm.  ABDOMEN:  Obese and difficult to assess.  I cannot palpate an aneurysm.  EXTREMITIES:  He has palpable femoral pulses.  Because of his right leg  swelling it is difficult to feel his popliteal pulse, though he does have  fairly brisk Doppler signals in the right foot.  I cannot palpate pedal  pulses on the left, although the foot appears adequately perfused.  He has  significant cellulitis of the right leg which is warm to touch.  He had a  small wound on the plantar aspect of his foot which I excised and this  appeared to be a small foreign body, perhaps a piece of gravel.  Subsequent  records do show that he has been on Levaquin.   IMPRESSION:  This patient has significant cellulitis of the right leg which   has not responded to p.o. Levaquin.  I have recommended that he be admitted  to the hospital for intravenous antibiotics and to follow his cellulitis.  As the cellulitis is mostly in the distal part of the leg I, at this point,  do not think that he has a graft infection.  However, this certainly is a  potential concern.  Will admit him on intravenous antibiotics and follow his  cellulitis.  If he continues to have persistent cellulitis he can  potentially need CT or MRA to look for other soft tissue infection.      CSD/MEDQ  D:  03/05/2005  T:  03/05/2005  Job:  [id number]

## 2011-03-14 NOTE — Op Note (Signed)
NAME:  [name], [name]                         ACCOUNT NO.:  [id number]   MEDICAL RECORD NO.:  [id number]                   PATIENT TYPE:  INP   LOCATION:  3303                                 FACILITY:  [facility]   PHYSICIAN:  [name]. Fields, MD               DATE OF BIRTH:  1946/01/28   DATE OF PROCEDURE:  07/12/2003  DATE OF DISCHARGE:                                 OPERATIVE REPORT   PREOPERATIVE DIAGNOSIS:  Occluded right femoral-popliteal bypass graft with  tissue loss on right toe.   POSTOPERATIVE DIAGNOSIS:  Occluded right femoral-popliteal bypass graft with  tissue loss on right toe.   PROCEDURES:  1. Right femoral endarterectomy.  2. Revision of proximal anastomosis of femoral-popliteal bypass.  3. Patch angioplasty of distal anastomosis of femoral-popliteal bypass.  4. Thrombectomy of femoral-popliteal bypass graft.  5. Intraoperative arteriogram.   INDICATIONS:  The patient is a 65 year old diabetic male who has a  nonhealing wound of his great toe.  He has previously had two right femoral-  popliteal bypass grafts, the latest of which has occluded.   ANESTHESIA:  General endotracheal.   FINDINGS:  1. Occluded right femoral-popliteal bypass graft.  2. Severe atherosclerotic changes of the common femoral and profunda femoris     artery.  3. Occluded superficial femoral artery.  4. Tight anastomotic stenosis of distal anastomosis.   SPECIMENS:  Thrombus from graft, sent to pathology.   OPERATIVE DETAILS:  After obtaining informed consent, the patient was taken  to the operating room.  The patient was explained the risks and benefits of  the operation, including but not limited to bleeding, infection, graft  thrombosis, and limb loss.   After induction of general anesthesia and endotracheal intubation, a Foley  catheter was placed.  Both lower extremities and the abdomen were prepped  and draped in the usual sterile fashion.  Next a longitudinal incision was  made in the right groin.  There was some scar in this area from the previous  femoral-popliteal bypass.  The dissection was carried down through the  subcutaneous tissues and all the way up to the inguinal ligament.  The  common femoral artery was isolated and dissected free circumferentially.  It  was noted to be heavily calcified with a poor pulse.  Next the profunda  femoris artery was dissected free circumferentially and controlled with a  vessel loop.  The superficial femoral artery was dissected free  circumferentially and controlled with a vessel loop.  The old femoral-  popliteal bypass graft was a PTFE graft, and this was also dissected free  circumferentially and controlled with a vessel loop.  The old femoral-  popliteal bypass was coming off the common femoral artery just above the  bifurcation.  Next the patient was given 5000 units of intravenous heparin.  A transverse graftotomy was made in the PTFE graft.  A #4 Fogarty catheter  was passed down  the graft and was easily passed to 70 cm.  The graft was  thrombectomized thoroughly until no further clot was obtained.  The graft  was then flushed with heparinized saline.  Next the Fogarty catheter was  passed proximally and there was noted to be some bleeding but the inflow was  inadequate.  Therefore, the graft was taken down from the proximal  anastomosis.  An arteriotomy was made up the common femoral artery and there  was noted to be severe plaque, which was heavily calcified throughout the  common femoral artery.  Therefore, an endarterectomy was performed up to the  level of the inguinal ligament and the plaque was removed.  The plaque  proceeded down into the profunda femoris artery, which was nearly occluded.  After removal of the plaque there was good back bleeding from the profunda,  and there was pulsatile good flow from the common femoral artery.  There was  also a small amount of back bleeding from the superficial  femoral artery,  but this is occluded distally.   Next a patch angioplasty was performed with a Dacron patch and a running 6-0  Prolene suture.  Prior to completing the anastomosis, the graft was  thoroughly flushed.  The vessel loop around the superficial femoral artery  was then released and the common femoral artery clamp was released to flush  the blood down the superficial femoral artery.  The profunda femoris control  was then released.  There was noted to be a good Doppler signal and pulse in  the profunda at this point.  There was also a very good pulse in the common  femoral artery at this point.  Next a 6 mm PTFE graft was brought up in the  operative field and the common femoral artery was reclamped.  The profunda  was reoccluded with a vessel loop and the superficial femoral artery was  also reoccluded with a vessel loop.  A longitudinal arteriotomy was made in  the patch and the 6 mm graft was spatulated and sewn end-to-side onto the  patch.  The graft was then clamped and then sewn end-to-end to the old  femoral-popliteal bypass proximally in the groin.  The clamps were released,  and there was noted to be good pulsatile flow within the graft and also  within the profunda femoris artery.  An intraoperative arteriogram was also  obtained by placing a 23-gauge butterfly needle into the PTFE graft.  The  arteriogram showed that the graft was patent with some small amounts of  thrombotic material still within it; however, there was a very tight distal  anastomotic stricture at the below-knee popliteal artery just below the knee  joint.  Therefore, a below-knee popliteal incision was made through the old  previously-existing scar on the medial aspect of the leg.  This area was  densely scarred and adherent due to previous infection in the area.  After  some dissection, the native popliteal artery and the PTFE graft distally were dissected free circumferentially.  The distal  popliteal artery was  controlled with an angled DeBakey clamp and the PTFE graft was clamped in  the groin.  Next a vertical arteriotomy was made through the hood of the  graft.  There was noted to be a tight anastomotic stenosis, and this was  primarily due to intimal hyperplasia but also due to some underlying  atherosclerotic changes of the popliteal artery.  The patch angioplasty was  performed in this area using a Dacron  patch and a running 6-0 Prolene  suture.  Prior to completing the anastomosis, the graft was thoroughly  flushed from above and back bled.  It was then thoroughly irrigated with  heparinized saline.  A Fogarty catheter was then passed retrograde up into  the groin to remove any small amounts of thrombus that remained in the  graft.  There was a good inflow at this point.  The anastomosis was  completed and clamps were removed.  There was noted to be good Doppler  signal in the popliteal artery distally.  There was also noted to be a  dorsalis pedis and posterior tibial signal distally.  There was also a  palpable dorsalis pedis pulse.  Next hemostasis was obtained.  The deep  layers in the below-knee incision were closed with a running Vicryl suture.  The skin was then closed with interrupted nylon sutures and staples.  The  groin was closed in multiple layers using a 2-0 Vicryl and 3-0 Vicryl.  The  skin was then closed with staples.  All wounds were thoroughly irrigated  prior to closure.  Hemostasis was good prior to closure.  The skin of the  groin was stapled.  The patient tolerated the procedure well and there were  no complications.  The instrument, sponge, and needle count was correct at  the end of the case.  The patient was taken to the recovery room in stable  condition.                                               [name]. Fields, MD    CEF/MEDQ  D:  07/12/2003  T:  07/13/2003  Job:  [id number]

## 2011-03-14 NOTE — Op Note (Signed)
NAMEHILBERTO, BURZYNSKI               ACCOUNT NO.:  [id number]   MEDICAL RECORD NO.:  [id number]          PATIENT TYPE:  INP   LOCATION:  5731                         FACILITY:  [facility]   PHYSICIAN:  [name]. Fields, MD  DATE OF BIRTH:  12-Feb-1946   DATE OF PROCEDURE:  01/29/2005  DATE OF DISCHARGE:                                 OPERATIVE REPORT   PROCEDURE:  Thrombectomy and revision of proximal anastomosis of right  femoral-popliteal bypass.   PREOPERATIVE DIAGNOSIS:  Rest pain with occluded right femoral-popliteal  bypass.   POSTOPERATIVE DIAGNOSIS:  Rest pain with occluded right femoral-popliteal  bypass.   ANESTHESIA:  General.   SURGEON:  Charles E. Fields, M.D.   ASSISTANT:  Mina Marble, P.A.   INDICATIONS:  The patient is a 65 year old male.  He has previously had a  right femoral to below-knee popliteal bypass done with Gore-Tex.  The graft  has occluded on one previous occasion.  He now presents with a second  occlusion of this graft.  He has also had a previous right femoral  endarterectomy and also after his initial femoral-popliteal bypass had  severe infection of the below-knee incision.   OPERATIVE FINDINGS:  1.  Thrombosed right femoral-popliteal bypass graft.  2.  Mild kink and redundancy adjacent to proximal anastomosis.  3.  Profunda femoris chronically occluded.  4.  Superficial femoral artery chronically occluded.   DRAINS:  10 flat JP right groin.   OPERATIVE DETAILS:  After obtaining informed consent, the patient was taken  to the operating room.  The patient was placed in the supine position on the  operating table.  After induction of general anesthesia and endotracheal  intubation, a Foley catheter was placed.  Next, the patient was prepped from  the umbilicus down to the toes bilaterally.  The right groin incision was  reopened and extended slightly proximally.  The incision was carried down  through the subcutaneous tissues and down to the level  of the right femoral  artery. This was approximately 4 inches deep.  Next, the old PTFE graft was  dissected free circumferentially. Dissection then proceeded down onto level  of the common femoral artery.  The pulse was fairly good at the level of the  inguinal ligament.  The inguinal ligament was slightly taken down by 1 or 2  cm in order to further expose the external iliac artery for proximal  control.  This was dissected free circumferentially and controlled with a  vessel loop.  Next, dissection proceeded down to the native superficial  femoral artery.  This was dissected free circumferentially and controlled  with a vessel loop.  This was calcified with heavy atheroma and chronically  occluded.  Next, dissection proceeded down to the level of the profunda  femoris artery.  During dissection of this a portion of the PTFE graft was  inadvertently entered. It was noted at this time that the graft had a fair  amount of medial to lateral kink and some mild redundancy.  There was  thrombus within the graft, therefore, attention was turned back to the  profunda femoris  artery.  Next, a large medial branch was dissected free  circumferentially.  This was found to be chronically occluded.  There was  also a more posteromedial branch and this was dissected free  circumferentially and controlled with a vessel loop.   At this point, the patient was given 7000 units of intravenous heparin.  A  #4 Fogarty catheter and #3 Fogarty catheter were then passed down the PTFE  graft distally and the catheter was advanced to 65 cm without difficulty.  The catheter was removed, all thrombotic material was completely removed as  well as an arterialized plug and there was good arterial back bleeding at  this point.  This was thoroughly flushed with heparinized saline and  clamped.  The graft was then divided at this location.  The old proximal  anastomosis was completely taken down.  A Dacron patch had been  previously  placed on the endarterectomy site, a portion of this was left as a cuff.  There was some thrombotic material within the common femoral artery and this  was all thoroughly debrided.  The single branch of the profunda artery that  was posteromedial had good backbleeding.  This was controlled with a vessel  loop.  Next, a Cooley clamp was used to control the distal external iliac  artery and a #4 Fogarty catheter was passed multiple times up this until all  thrombotic material was completely removed.  There was good arterial inflow  at this point.  This was then reclamped.  Next, a 6-mm PTFE graft was  brought up on the operative field and beveled and sewn onto the old patch  angioplasty site as the hood of the proximal anastomosis.  This was done  with a running 5-0 Prolene suture.  At completion of the anastomosis this  was thoroughly flushed through the graft and then flow was then restored to  the profunda branch.  The pulse was fairly diminished and it was thought  this was due to arterial calcification, but to further check this a  butterfly needle was introduced into the graft at its hood and the femoral  artery pressure was measured.  This was approximately 8 mm higher than the  arm pressure and preoperative arteriography had shown no subclavian or  proximal great vessel stenosis.  At this point, it was decided that flow to  the femoral region was adequate.  The graft was then cut to length and then  sewn end-to-end to the old PTFE graft using a running 6-0 Prolene suture.  Just prior to completion of anastomosis this was forward and back bled and  thoroughly flushed.  The anastomosis was secured and hemostasis was  obtained.  The foot was inspected and found to have a biphasic dorsalis  pedis Doppler signal.  This completely dissipated with clamping of the graft.  With opening of the graft, the biphasic Doppler signal returned.  Next, the wound was again carefully  inspected and hemostasis was made.  The  groin was then thoroughly irrigated with normal saline solution.  The groin  was closed in multiple layers with 2-0 Vicryl suture, 3-0 Vicryl suture and  then the skin closed with staples. A 10-mm flat JP was brought out through a  separate stab incision in the inferior aspect of the groin.  This placed  over the level of the graft.  The patient tolerated the procedure well and  there were no complications.  Instrument, sponge, and needle counts were  correct at the end  of the case.  The patient was taken to the recovery in  stable condition.      CEF/MEDQ  D:  01/29/2005  T:  01/29/2005  Job:  [id number]

## 2011-03-14 NOTE — Op Note (Signed)
[name], [name]               ACCOUNT NO.:  [id number]   MEDICAL RECORD NO.:  [id number]          PATIENT TYPE:  INP   LOCATION:  5022                         FACILITY:  [facility]   PHYSICIAN:  [name]. Fields, MD  DATE OF BIRTH:  1946/04/24   DATE OF PROCEDURE:  03/30/2006  DATE OF DISCHARGE:                                 OPERATIVE REPORT   PROCEDURE:  Debridement of right below-knee amputation.   PREOPERATIVE DIAGNOSIS:  Nonhealing right below-knee amputation.   POSTOPERATIVE DIAGNOSIS:  Nonhealing right below-knee amputation.   ANESTHESIA:  General.   ASSISTANT:  Rowe Clack, P.A.-C.   INDICATIONS:  The patient is status post right below-knee amputation  approximately 1 month ago.  He has had poorly healing tissues from this  wound.   OPERATIVE FINDINGS:  Extensive necrosis of posterior muscle flap and  surrounding tissues.   OPERATIVE DETAILS:  Obtaining informed consent, the patient was taken to the  operating room .  The patient was placed in the supine position on the  operating room table.  After induction of general anesthesia and  endotracheal elevation, the patient's entire right lower extremity was  prepped and draped in the usual sterile fashion.  Next, debridement was  started along the skin edges between the posterior and anterior portions of  the incision.  There was necrotic debris in this area.  Debridement was  continued down to level of bleeding tissue.  This required extensive  debridement of the posterior muscle flap. There was a large amount of large  fluid collection in the posterior flap, and there was a foul smell about  this.  All tissues were thoroughly debrided until there was healthy bleeding  throughout most of the leg.  This required debridement of almost all the  posterior muscle flap.  Tissues were then thoroughly irrigated with normal  saline solution.  Hemostasis was obtained.  A normal saline Kerlix dressing  was then applied to the  wound and an Ace wrap.  The patient tolerated the  procedure well, and there were no complications.  Instrument, sponge, and  needle count was correct at the end of the case.  The patient was taken to  the recovery room in stable condition.      [name]. Fields, MD  Electronically Signed     CEF/MEDQ  D:  03/30/2006  T:  03/31/2006  Job:  [id number]

## 2011-03-14 NOTE — Procedures (Signed)
[facility]  Patient:    [name], [name] Visit Number: [id number] MRN: [id number]          Service Type: MED Location: 2300 [id number] Attending Physician:  Waldo Laine Dictated by:   Guadalupe Maple, M.D. Proc. Date: 09/15/01 Admit Date:  09/14/2001                             Procedure Report  PROCEDURE:  Intraoperative transesophageal echocardiography.  CLINICAL NOTE:  Mr. [name] is a 65 year old white male with a history of three-vessel coronary artery disease and atrial fibrillation of undetermined duration, who is scheduled to undergo coronary artery bypass grafting by Gwenith Daily. Tyrone Sage, M.D., under general anesthesia.  Since the patient was in atrial fibrillation, Dr. Tyrone Sage requested that we do a preoperative assessment of the left atrial appendage to determine if any thrombus was present.  DESCRIPTION OF PROCEDURE:  The patient was brought to the operating room at [facility], and general anesthesia was induced without difficulty. The trachea was intubated easily.  The transesophageal echocardiography probe was then inserted into the esophagus without difficulty.  IMPRESSION:  PRE-BYPASS FINDINGS: 1. There is an echodensity present in the left atrial appendage that was    suspicious for thrombus.  No other evidence of thrombus was present in the    left atrium. 2. The mitral valve structurally appeared normal with some mitral annular    calcification.  There was trace mitral insufficiency. 3. The aortic valve was thickened.  Leaflets were thickened, but it was a    trileaflet valve that appeared to have a minimal degree of restricted    opening.  Valve area was estimated at 2.5 sq. cm by planimetry. 4. There was no evidence of aortic insufficiency. 5. There was moderate left ventricular dysfunction.  The left ventricle    was moderately dilated with global hypokinesis and an ejection fraction  estimated at 40%. 6. Moderate right ventricular enlargement.  The right ventricle was enlarged.    There was mild right ventricular hypokinesis of the right ventricular free    wall. 7. Intact interatrial septum. 8. There was 1-2+ tricuspid insufficiency.  POST-BYPASS FINDINGS: 1. The left atrial appendage appeared to be obliterated and no longer appeared    to be in communication with the left atrium. 2. The mitral valve was unchanged from the pre-bypass study.  There was mitral    annular calcification but otherwise normal-appearing valve with trace    mitral insufficiency. 3. Thickened aortic valve with mildly restricted opening of the aortic valve    and no aortic insufficiency. 4. Moderate left ventricular dysfunction with ejection fraction estimated at    45%, unchanged from the pre-bypass study. 5. Right ventricular enlargement with right ventricular hypokinesis that was    similar to the pre-bypass study. Dictated by:   Guadalupe Maple, M.D. Attending Physician:  Waldo Laine DD:  09/15/01 TD:  09/16/01 Job: [id number]

## 2011-03-14 NOTE — Discharge Summary (Signed)
NAME:  [name], BRIGHAM                         ACCOUNT NO.:  [id number]   MEDICAL RECORD NO.:  [id number]                   PATIENT TYPE:  INP   LOCATION:  3305                                 FACILITY:  [facility]   PHYSICIAN:  Quita Skye. Hart Rochester, M.D.               DATE OF BIRTH:  1946/07/12   DATE OF ADMISSION:  12/29/2002  DATE OF DISCHARGE:  12/31/2002                                 DISCHARGE SUMMARY   PRIMARY CARE PHYSICIAN:  Jeannett Senior A. Evlyn Kanner, M.D.   FINAL DIAGNOSES:  1. Severe peripheral vascular disease with non-healing toe wound of right     lower extremity.  2. Previous left common femoral to above-the-knee popliteal bypass graft     with Gore-Tex on December 01, 2001.  3. Coronary artery disease with previous coronary artery bypass grafting in     November 2002.  4. History of atrial fibrillation with chronic Coumadin therapy.  5. Type 2 non-insulin dependent diabetes.  6. Chronic obstructive pulmonary disease.  7. Hypothyroidism.  8. Peripheral neuropathy.  9. Hiatal hernia.  10.      Hypercholesterolemia.   PROCEDURE:  Right lower extremity femoral to below-the-knee popliteal artery  bypass graft with Gore-Tex and intraoperative arteriogram on December 29, 2002.   HISTORY OF PRESENT ILLNESS:  The patient was a 65 year old white male who  presented with a non-healing toe wound of the right lower extremity and is  having a severe peripheral vascular disease.  Dr. Hart Rochester recommended  revascularization.   HOSPITAL COURSE:  He was admitted to the hospital on December 29, 2002, for  right lower extremity to below-the-knee popliteal bypass graft with [facility].  He underwent the procedure with no complications.  Postoperatively, he  had palpable pulses and good Doppler signals in his foot.  His Coumadin was  resumed gradually.  He remained clinically stable with no major  complications postoperatively.  By postoperative day #2, he was comfortable,  afebrile, vital signs were  stable, his right foot was warm and well  perfused, his incisions were healing well, his INR was 1, he was doing  suitable for discharge home, and was discharged.  Home health was arranged.  A walker was also ordered for ambulation.   DISCHARGE MEDICATIONS:  1. He was told to resume his home medications, including his Coumadin 5 mg     daily.  2. Zocor 80 mg 1/2 tablet daily.  3. Avandia 8 mg 1/2 tablet daily.  4. Gemfibrozil 600 mg one tablet b.i.d.  5. Metoprolol 50 mg one tablet b.i.d.  6. Percocet one or two q.4-6h. p.r.n. pain.  7. Synthroid 0.05 mg daily.  8. Lotensin 10/12.5 mg daily.  9. Lantus 15 units subcutaneously q.a.m.   ALLERGIES:  No known drug allergies.   CONDITION ON DISCHARGE:  Stable.   DISPOSITION:  Home.   ACTIVITY:  He is told to avoid driving, working, strenuous activity.  WOUND CARE:  He is told he can shower.  Home nurse was arranged to remove  his staples in his leg 10 days after discharge.   FOLLOWUP:  1. He was told to get his blood drawn to check his Coumadin on Tuesday,     January 03, 2003.  2. He was told to see Dr. Hart Rochester on January 17, 2003 at 9 a.m.     [name], P.A.                          Quita Skye Hart Rochester, M.D.    [name]  D:  03/14/2003  T:  03/14/2003  Job:  [id number]

## 2011-03-14 NOTE — Discharge Summary (Signed)
NAMEMARKHI, KLECKNER               ACCOUNT NO.:  [id number]   MEDICAL RECORD NO.:  [id number]          PATIENT TYPE:  INP   LOCATION:  5022                         FACILITY:  [facility]   PHYSICIAN:  [name]. Fields, MD  DATE OF BIRTH:  1945-11-29   DATE OF ADMISSION:  03/27/2006  DATE OF DISCHARGE:  04/23/2006                                 DISCHARGE SUMMARY   HISTORY OF PRESENT ILLNESS:  The patient is a 65 year old Caucasian male  well known to [facility].  He has a long history of peripheral vascular occlusive  disease status post multiple revascularization procedures and recent right  below-knee amputation 1 month ago.  The patient presented to the office on  the date of admission with nonhealing of the amputation with evidence of  infection, requiring admission for debridement and further treatment as  required based on hospital course.   PAST MEDICAL HISTORY:  1.  Peripheral vascular occlusive disease.  2.  Chronic atrial fibrillation.   Dictation ended at this point.      Rowe Clack, P.A.-C.      [name]. Fields, MD  Electronically Signed    WEG/MEDQ  D:  04/23/2006  T:  04/23/2006  Job:  [id number]   cc:   Colleen Can. Deborah Chalk, M.D.  Fax: [phone]   Tera Mater. Evlyn Kanner, M.D.  Fax: [phone]   Terrial Rhodes, M.D.  Fax: [phone]

## 2011-03-14 NOTE — H&P (Signed)
NAME:  [name], [name]                         ACCOUNT NO.:  [id number]   MEDICAL RECORD NO.:  [id number]                   PATIENT TYPE:  INP   LOCATION:  3303                                 FACILITY:  [facility]   PHYSICIAN:  Quita Skye. Hart Rochester, M.D.               DATE OF BIRTH:  [birth year]   DATE OF ADMISSION:  01/21/2003  DATE OF DISCHARGE:                                HISTORY & PHYSICAL   CHIEF COMPLAINT:  Painful draining right lower extremity surgical incision.   HISTORY OF PRESENT ILLNESS:  This is a 65 year old white male well known to  [facility].  He was recently admitted 12/29/02 for right lower extremity femoral to  below the knee popliteal artery bypass grafting with Gore-Tex which was to  address peripheral vascular disease and claudication and nonhealing ulcer of  the right great toe.  He tolerated the procedure well.  His Coumadin was  restarted and he was eventually discharged home on 12/31/02.  Initially [name] reports he did well after discharge.  However for the last several  days he has complained of pain and swelling in his right lower extremity  around his surgical incision.  He was seen at the office Tuesday at which  time he was noted to have no fever and no signs of infection.  He came again  the following Thursday with complaints of swelling but again there was no  fever or signs of infection.  Today his pain persisted and while in the car  his right lower extremity incision came apart spontaneously, draining brown  fluid.  He comes to the emergency room for evaluation.  Dr. Hart Rochester has seen  [name] and evaluated his wound and recommends he return to the OR for  surgical I&D.  He now is undergoing routine preoperative labs awaiting  returning to the OR.  He reports no other complaints.   PAST MEDICAL HISTORY:  1. Peripheral vascular disease with recent right lower extremity femoral to     below the knee popliteal bypass graft using Gore-Tex on 12/29/02.  2.  History of left fem-pop bypass graft above the knee with Gore-Tex,     12/01/01.  3. Incision and drainage of left thigh lymphocele on 01/17/02.  4. Coronary artery disease with previous coronary artery bypass graft, 11/02     by Dr. Tyrone Sage.  5. Adult onset diabetes, Insulin dependent.  6. Chronic pulmonary obstructive disease.  7. Hypothyroidism.  8. Dyslipidemia.  9. History of hiatal hernia.  10.      Hypertension.  11.      Peripheral neuropathy.   SOCIAL HISTORY:  He is a widower.  He previously smoked two to three packs a  day for 40 years but quit in 11/02.  He uses occasional alcohol.  He has two  sons.  He lives in this area.   FAMILY HISTORY:  Mother living at age 62.  Father died at age 47 with  esophageal varices.  He has two older sisters in fairly good health.   MEDICATIONS:  He was taking Coumadin 5 mg tablets 1/2 tablet daily, last  dose was yesterday, also Zocor 40 mg daily, Avandia 4 mg daily, Gemfibrozil  600 mg b.i.d., Lopressor 50 mg b.i.d., Lantus Insulin 15 units SQ daily,  Lotensin/hydrochlorothiazide 10/12.5 daily, Amaryl 2 mg daily, Synthroid  0.05 mg daily.   ALLERGIES:  No known drug allergies.   REVIEW OF SYSTEMS:  [name] relates swelling and pain in his right lower  extremity.  He denies fever or chills, shortness of breath, cough, chest  pain, palpitations.  He denies GI disturbances, GU disturbances.  He denies  symptoms of stroke including visual changes, speech changes, weakness in his  extremities.   PHYSICAL EXAMINATION:  VITAL SIGNS: Temperature 99.5, blood pressure 114/76,  pulse 111, O2 saturation 95% on room air.  HEENT: Normocephalic, atraumatic.  Pupils are equal, round and reactive to  light.  Extraocular movements are intact.  Visual acuity grossly intact OU.  Oral pharynx is grossly normal, he has poor dentition and wears an upper  dental appliance.  NECK: Supple, 2+ carotids, no bruits.  CHEST: Clear to auscultation and  symmetric A&P.  HEART: Regular rate and rhythm S1, S2 with no murmur, rub or gallop.  ABDOMEN: Obese, soft, nontender, nondistended with bowel sounds present, no  mass is felt, no bruits heard.  LOWER EXTREMITIES: Are adequately perfused, they are somewhat edematous.  Right lower extremity is dressed recently as per Dr. Candie Chroman recent exam,  it is tender and swollen.  NEUROLOGIC: Cranial nerves are intact, there were no focal deficits on exam.   ASSESSMENT AND PLAN:  This is a 65 year old male recently discharged after  right lower extremity bypass grafting who presents now with spontaneously  draining right lower extremity surgical wound, probably infected.  He is to  be taken to the operating room for incision and drainage by Dr. Hart Rochester soon.  We will check his routine preoperative blood work including his protime  since he has been on Coumadin.      [name], P.A.                          Quita Skye Hart Rochester, M.D.    Alwyn Ren  D:  01/21/2003  T:  01/22/2003  Job:  [id number]

## 2011-05-28 ENCOUNTER — Encounter: Payer: Self-pay | Admitting: Vascular Surgery

## 2011-05-29 ENCOUNTER — Encounter (INDEPENDENT_AMBULATORY_CARE_PROVIDER_SITE_OTHER): Payer: Medicare Other

## 2011-05-29 ENCOUNTER — Encounter: Payer: Self-pay | Admitting: Vascular Surgery

## 2011-05-29 ENCOUNTER — Ambulatory Visit (INDEPENDENT_AMBULATORY_CARE_PROVIDER_SITE_OTHER): Payer: Medicare Other | Admitting: Vascular Surgery

## 2011-05-29 VITALS — BP 118/76 | HR 68 | Resp 16 | Ht 70.0 in | Wt 200.0 lb

## 2011-05-29 DIAGNOSIS — I739 Peripheral vascular disease, unspecified: Secondary | ICD-10-CM

## 2011-05-29 DIAGNOSIS — L98499 Non-pressure chronic ulcer of skin of other sites with unspecified severity: Secondary | ICD-10-CM

## 2011-05-29 NOTE — Progress Notes (Signed)
CHECK LEFT THIRD TOE

## 2011-05-29 NOTE — Progress Notes (Signed)
Subjective:     Patient ID: [name], male   DOB: 12-Nov-1945, 65 y.o.   MRN: [id number]  HPI Patient is a 65 year old male with extensive vascular surgery history. He recently developed an infection in the left first toe nail and have the nail removed by Dr. Evlyn Kanner yesterday. He also has developed a blister on the left third toe. He is currently treating this with soap and water washing. He denies fever chills. 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 He has also had a left femoral to below-knee popliteal bypass with reversed greater saphenous vein. He is also had a failed right axillary femoral bypass.  Still smoking.  Chronic medical problems: Atrial fibrillation, coronary artery disease, Diabetes mellitus, COPD, hypertension, peripheral neuropathy, hyperlipidemia are currently stable and followed by Dr Evlyn Kanner  Review of Systems Uses wheelchair, does not ambulate but does transfer  Denies shortness of breath  Current Outpatient Prescriptions on File Prior to Visit  Medication Sig Dispense Refill  . benazepril-hydrochlorthiazide (LOTENSIN HCT) 10-12.5 MG per tablet Take 1 tablet by mouth daily.        . digoxin (LANOXIN) 0.125 MG tablet Take 125 mcg by mouth daily.        [name] docusate sodium (COLACE) 100 MG capsule Take 100 mg by mouth daily.        [name] doxycycline (VIBRAMYCIN) 100 MG capsule Take 100 mg by mouth 2 (two) times daily.        . fenofibrate 160 MG tablet Take 160 mg by mouth daily.        . furosemide (LASIX) 20 MG tablet Take 20 mg by mouth every Monday, Wednesday, and Friday.        [name] glimepiride (AMARYL) 4 MG tablet Take 2 mg by mouth daily before breakfast.        . levothyroxine (SYNTHROID, LEVOTHROID) 50 MCG tablet Take 50 mcg by mouth daily.        . metoprolol (LOPRESSOR) 50 MG tablet Take 50 mg by mouth 2 (two) times daily.        . Multiple Vitamin (MULTIVITAMIN) tablet Take 1 tablet by  mouth daily.        . pravastatin (PRAVACHOL) 20 MG tablet Take 20 mg by mouth daily.        [name] warfarin (COUMADIN) 3 MG tablet Take 3 mg by mouth daily.             Objective:   Physical Exam Blood pressure 118/76, pulse 68, resp. rate 16, height 5\' 10"  (1.778 m), weight 200 lb (90.719 kg).  Left foot- first toenail absent, no purulent drainage, blister and duskiness left 3rd toe  1+ left femoral pulse possibly secondary to scar tissue Absent right femoral pulse  Well healed right AKA  Chest CTA, distant breath sounds  Cardiac RRR no murmur  Graft Duplex- patent left fem pop bypass with mild increase in velocity at femoral level, toe pressure flat    Assessment:     Left first toe infection possible early gangrene 3rd toe with patent bypass graft suggesting primarily small vessel disease    Plan:     Continue antibiotics for now.  Return for followup in one week.  If worse will consider amputation of toes.  Pt is high risk for limb loss.

## 2011-06-05 ENCOUNTER — Encounter: Payer: Self-pay | Admitting: Vascular Surgery

## 2011-06-05 ENCOUNTER — Ambulatory Visit (INDEPENDENT_AMBULATORY_CARE_PROVIDER_SITE_OTHER): Payer: Medicare Other | Admitting: Vascular Surgery

## 2011-06-05 VITALS — BP 135/66 | HR 89 | Resp 16

## 2011-06-05 DIAGNOSIS — I70219 Atherosclerosis of native arteries of extremities with intermittent claudication, unspecified extremity: Secondary | ICD-10-CM

## 2011-06-05 NOTE — Progress Notes (Signed)
Subjective:     Patient ID: [name], male   DOB: 1946-04-17, 65 y.o.   MRN: [id number]  HPI Patient is a 65 year old male who returns today for recheck of his left foot. He states the drainage in the left foot has decreased. He denies any pain. He has no fever or chills.  Review of Systems     Objective:   Physical Exam    Left foot: The third toe has dry gangrenous changes, there is some maceration of the plantar aspect of toes 2, 3 and 4 probably from being wet Assessment:     Improved left foot wound.       Plan:     Will reevaluate again in 3 weeks, he will return sooner if there is any deterioration.

## 2011-06-10 NOTE — Procedures (Unsigned)
BYPASS GRAFT EVALUATION  INDICATION:  Lower extremity gangrene.  HISTORY: Diabetes:  Yes. Cardiac:  CABG in 2002. Hypertension:  Yes. Smoking:  Yes. Previous Surgery:  Left fem-pop bypass graft on 09/04/2005, right below knee amputation on 01/27/2006.  SINGLE LEVEL ARTERIAL EXAM                              RIGHT              LEFT Brachial:                    160                181 Anterior tibial: Posterior tibial: Peroneal: Ankle/brachial index:        AKA                Unobtainable ABI/TBI  PREVIOUS ABI:  Date:  RIGHT:  LEFT:  LOWER EXTREMITY BYPASS GRAFT DUPLEX EXAM:  DUPLEX:  Monophasic Doppler waveforms noted throughout the left lower extremity bypass graft with velocity of 241 cm/s noted in the left common femoral artery.  IMPRESSION: 1. Patent left femoral-popliteal bypass graft with no evidence of     stenosis. 2. Velocity of 241 cm/s noted in the left common femoral artery. 3. Monophasic Doppler waveforms noted in the tibial arteries of the     left lower extremity.  Flow was not detected in the left first and     third digits using PPG sensor.  ___________________________________________ [name] Fields, MD  CH/MEDQ  D:  05/30/2011  T:  05/30/2011  Job:  [id number]

## 2011-06-20 ENCOUNTER — Telehealth: Payer: Self-pay

## 2011-06-20 NOTE — Telephone Encounter (Signed)
Rec'd call from son with report of worsening of 3rd toe of left foot.  Stated 3rd toe is black, and has foul odor.  States there is moisture between all toes, and skin is peeling from bottom of foot, and now has a sore on ball of foot. Has appt. On 8/30 w/ Dr. Darrick Penna.  Advised son that pt. should not wait until 8/30 to be seen, due to worsening of sx's.  Nurse advises that pt. be taken to ER.  Adv son that no MD available to see pt. today.  Son states that his father will not go to the hospital.  Discussed w/ Dr. Imogene Burn at office/On call.  Recommends that pt. go to ER, and not wait until office visit next week.  Son was notified of Dr. Nicky Pugh recommendation, and will try to encourage father to go to the hospital.

## 2011-06-23 ENCOUNTER — Emergency Department (HOSPITAL_COMMUNITY): Payer: Medicare Other

## 2011-06-23 ENCOUNTER — Inpatient Hospital Stay (HOSPITAL_COMMUNITY)
Admission: EM | Admit: 2011-06-23 | Discharge: 2011-07-01 | DRG: 240 | Disposition: A | Discharge status: Home Health | Payer: Medicare Other | Attending: Vascular Surgery | Admitting: Vascular Surgery

## 2011-06-23 DIAGNOSIS — J4489 Other specified chronic obstructive pulmonary disease: Secondary | ICD-10-CM | POA: Diagnosis present

## 2011-06-23 DIAGNOSIS — I96 Gangrene, not elsewhere classified: Secondary | ICD-10-CM | POA: Diagnosis present

## 2011-06-23 DIAGNOSIS — E1129 Type 2 diabetes mellitus with other diabetic kidney complication: Secondary | ICD-10-CM | POA: Diagnosis present

## 2011-06-23 DIAGNOSIS — I4891 Unspecified atrial fibrillation: Secondary | ICD-10-CM | POA: Diagnosis present

## 2011-06-23 DIAGNOSIS — I129 Hypertensive chronic kidney disease with stage 1 through stage 4 chronic kidney disease, or unspecified chronic kidney disease: Secondary | ICD-10-CM | POA: Diagnosis present

## 2011-06-23 DIAGNOSIS — E1142 Type 2 diabetes mellitus with diabetic polyneuropathy: Secondary | ICD-10-CM | POA: Diagnosis present

## 2011-06-23 DIAGNOSIS — Z7901 Long term (current) use of anticoagulants: Secondary | ICD-10-CM

## 2011-06-23 DIAGNOSIS — E1159 Type 2 diabetes mellitus with other circulatory complications: Principal | ICD-10-CM | POA: Diagnosis present

## 2011-06-23 DIAGNOSIS — J449 Chronic obstructive pulmonary disease, unspecified: Secondary | ICD-10-CM | POA: Diagnosis present

## 2011-06-23 DIAGNOSIS — I2510 Atherosclerotic heart disease of native coronary artery without angina pectoris: Secondary | ICD-10-CM | POA: Diagnosis present

## 2011-06-23 DIAGNOSIS — N183 Chronic kidney disease, stage 3 unspecified: Secondary | ICD-10-CM | POA: Diagnosis present

## 2011-06-23 DIAGNOSIS — I70269 Atherosclerosis of native arteries of extremities with gangrene, unspecified extremity: Secondary | ICD-10-CM

## 2011-06-23 DIAGNOSIS — Z951 Presence of aortocoronary bypass graft: Secondary | ICD-10-CM

## 2011-06-23 DIAGNOSIS — S88119A Complete traumatic amputation at level between knee and ankle, unspecified lower leg, initial encounter: Secondary | ICD-10-CM

## 2011-06-23 DIAGNOSIS — F17200 Nicotine dependence, unspecified, uncomplicated: Secondary | ICD-10-CM | POA: Diagnosis present

## 2011-06-23 DIAGNOSIS — T8189XA Other complications of procedures, not elsewhere classified, initial encounter: Secondary | ICD-10-CM | POA: Diagnosis not present

## 2011-06-23 DIAGNOSIS — Y835 Amputation of limb(s) as the cause of abnormal reaction of the patient, or of later complication, without mention of misadventure at the time of the procedure: Secondary | ICD-10-CM | POA: Diagnosis not present

## 2011-06-23 DIAGNOSIS — Z794 Long term (current) use of insulin: Secondary | ICD-10-CM

## 2011-06-23 DIAGNOSIS — I798 Other disorders of arteries, arterioles and capillaries in diseases classified elsewhere: Secondary | ICD-10-CM | POA: Diagnosis present

## 2011-06-23 DIAGNOSIS — E1149 Type 2 diabetes mellitus with other diabetic neurological complication: Secondary | ICD-10-CM | POA: Diagnosis present

## 2011-06-23 DIAGNOSIS — D62 Acute posthemorrhagic anemia: Secondary | ICD-10-CM | POA: Diagnosis not present

## 2011-06-23 DIAGNOSIS — S78119A Complete traumatic amputation at level between unspecified hip and knee, initial encounter: Secondary | ICD-10-CM

## 2011-06-23 LAB — GLUCOSE, CAPILLARY
Glucose-Capillary: 302 mg/dL — ABNORMAL HIGH (ref 70–99)
Glucose-Capillary: 282 mg/dL — ABNORMAL HIGH (ref 70–99)

## 2011-06-23 LAB — DIFFERENTIAL
Basophils Absolute: 0.1 10*3/uL (ref 0.0–0.1)
Basophils Relative: 0 % (ref 0–1)
Eosinophils Absolute: 0.1 10*3/uL (ref 0.0–0.7)
Eosinophils Relative: 1 % (ref 0–5)
Monocytes Absolute: 1.4 10*3/uL — ABNORMAL HIGH (ref 0.1–1.0)
Neutro Abs: 12 10*3/uL — ABNORMAL HIGH (ref 1.7–7.7)

## 2011-06-23 LAB — CBC
MCHC: 33.2 g/dL (ref 30.0–36.0)
Platelets: 343 10*3/uL (ref 150–400)
RDW: 13.3 % (ref 11.5–15.5)

## 2011-06-23 LAB — PROTIME-INR
INR: 1.2 (ref 0.00–1.49)
Prothrombin Time: 15.5 seconds — ABNORMAL HIGH (ref 11.6–15.2)

## 2011-06-23 LAB — BASIC METABOLIC PANEL
Calcium: 9.1 mg/dL (ref 8.4–10.5)
Creatinine, Ser: 1.44 mg/dL — ABNORMAL HIGH (ref 0.50–1.35)
GFR calc Af Amer: 60 mL/min — ABNORMAL LOW (ref >60)

## 2011-06-24 ENCOUNTER — Other Ambulatory Visit: Payer: Self-pay | Admitting: Vascular Surgery

## 2011-06-24 DIAGNOSIS — I739 Peripheral vascular disease, unspecified: Secondary | ICD-10-CM

## 2011-06-24 DIAGNOSIS — M79609 Pain in unspecified limb: Secondary | ICD-10-CM

## 2011-06-24 HISTORY — PX: TRANSMETATARSAL AMPUTATION: SHX6197

## 2011-06-24 LAB — GLUCOSE, CAPILLARY
Glucose-Capillary: 190 mg/dL — ABNORMAL HIGH (ref 70–99)
Glucose-Capillary: 236 mg/dL — ABNORMAL HIGH (ref 70–99)
Glucose-Capillary: 218 mg/dL — ABNORMAL HIGH (ref 70–99)

## 2011-06-24 LAB — HEMOGLOBIN A1C: Hgb A1c MFr Bld: 8.3 % — ABNORMAL HIGH (ref <5.7)

## 2011-06-25 DIAGNOSIS — I70269 Atherosclerosis of native arteries of extremities with gangrene, unspecified extremity: Secondary | ICD-10-CM

## 2011-06-25 LAB — BASIC METABOLIC PANEL
BUN: 26 mg/dL — ABNORMAL HIGH (ref 6–23)
CO2: 28 mEq/L (ref 19–32)
Calcium: 9.3 mg/dL (ref 8.4–10.5)
Chloride: 103 mEq/L (ref 96–112)
Creatinine, Ser: 1.71 mg/dL — ABNORMAL HIGH (ref 0.50–1.35)
GFR calc Af Amer: 49 mL/min — ABNORMAL LOW (ref >60)
GFR calc non Af Amer: 40 mL/min — ABNORMAL LOW (ref >60)
Glucose, Bld: 225 mg/dL — ABNORMAL HIGH (ref 70–99)
Potassium: 3.7 mEq/L (ref 3.5–5.1)
Sodium: 140 mEq/L (ref 135–145)

## 2011-06-25 LAB — WOUND CULTURE

## 2011-06-25 LAB — GLUCOSE, CAPILLARY
Glucose-Capillary: 199 mg/dL — ABNORMAL HIGH (ref 70–99)
Glucose-Capillary: 252 mg/dL — ABNORMAL HIGH (ref 70–99)
Glucose-Capillary: 90 mg/dL (ref 70–99)
Glucose-Capillary: 96 mg/dL (ref 70–99)

## 2011-06-25 LAB — CBC
HCT: 33.9 % — ABNORMAL LOW (ref 39.0–52.0)
MCH: 27.2 pg (ref 26.0–34.0)
MCHC: 31.6 g/dL (ref 30.0–36.0)
RDW: 13.4 % (ref 11.5–15.5)

## 2011-06-25 LAB — PROTIME-INR
INR: 1.3 (ref 0.00–1.49)
Prothrombin Time: 16.4 seconds — ABNORMAL HIGH (ref 11.6–15.2)

## 2011-06-26 ENCOUNTER — Ambulatory Visit: Payer: Medicare Other | Admitting: Vascular Surgery

## 2011-06-26 LAB — COMPREHENSIVE METABOLIC PANEL
ALT: 13 U/L (ref 0–53)
BUN: 21 mg/dL (ref 6–23)
CO2: 29 mEq/L (ref 19–32)
Calcium: 9.7 mg/dL (ref 8.4–10.5)
GFR calc Af Amer: 56 mL/min — ABNORMAL LOW (ref >60)
GFR calc non Af Amer: 47 mL/min — ABNORMAL LOW (ref >60)
Glucose, Bld: 217 mg/dL — ABNORMAL HIGH (ref 70–99)
Sodium: 140 mEq/L (ref 135–145)

## 2011-06-26 LAB — GLUCOSE, CAPILLARY
Glucose-Capillary: 133 mg/dL — ABNORMAL HIGH (ref 70–99)
Glucose-Capillary: 192 mg/dL — ABNORMAL HIGH (ref 70–99)

## 2011-06-26 LAB — CBC
HCT: 35.9 % — ABNORMAL LOW (ref 39.0–52.0)
Hemoglobin: 11.9 g/dL — ABNORMAL LOW (ref 13.0–17.0)
MCH: 28.3 pg (ref 26.0–34.0)
MCV: 85.3 fL (ref 78.0–100.0)
RBC: 4.21 MIL/uL — ABNORMAL LOW (ref 4.22–5.81)

## 2011-06-26 NOTE — Op Note (Signed)
  NAMECORDAY, WYKA               ACCOUNT NO.:  [id number]  MEDICAL RECORD NO.:  [id number]  LOCATION:  2028                         FACILITY:  [facility]  PHYSICIAN:  [name]. Fields, MD  DATE OF BIRTH:  1946-06-03  DATE OF PROCEDURE:  06/24/2011 DATE OF DISCHARGE:                              OPERATIVE REPORT   PROCEDURE:  Transmetatarsal amputation of toes 3, 4, and 5 of left foot.  PREOPERATIVE DIAGNOSIS:  Gangrene, left foot.  POSTOPERATIVE DIAGNOSIS:  Gangrene, left foot.  ANESTHESIA:  General.  ASSISTANT:  Nurse.  OPERATIVE FINDINGS:  Poorly bleeding tissue from the third, fourth, and fifth metatarsal amputation.  OPERATIVE DETAILS:  After obtaining informed consent, the patient was taken to the operating room.  The patient was placed in supine position on the operating table.  After induction of general anesthesia and endotracheal intubation, the patient's entire left foot was prepped and draped in usual sterile fashion.  Next, a curvilinear incision was made on the dorsal aspect of the left foot incorporating the metatarsal shaft of the third, fourth, and fifth toes.  This was carried all the way down to the bone.  The bone was transected on all 3 metatarsals with a bone cutter.  Remainder of the soft tissue was divided with a knife.  The toes were passed off table as specimen.  There was essentially no significant bleeding from any of the tissues, and these were all pale and dusky in appearance.  There was no evidence of abscess.  The bone was fairly brittle.  Wound was thoroughly irrigated with normal saline solution.  Again, there was still poor bleeding.  I did not feel further debridement of the foot was necessary due to the fact they have all bend in the tarsal bones, and the patient will most likely need a below-knee or above-knee amputation in the near future.  A normal saline wet-to-dry dressing was applied to the foot.  The patient tolerated the  procedure well.  There were no complications.  The patient was extubated in the operating room and taken to recovery room in stable condition.     [name]. Fields, MD     CEF/MEDQ  D:  06/25/2011  T:  06/25/2011  Job:  [id number]  Electronically Signed by Fabienne Bruns MD on 06/26/2011 06:33:47 PM

## 2011-06-27 ENCOUNTER — Other Ambulatory Visit: Payer: Self-pay | Admitting: Vascular Surgery

## 2011-06-27 DIAGNOSIS — I70269 Atherosclerosis of native arteries of extremities with gangrene, unspecified extremity: Secondary | ICD-10-CM

## 2011-06-27 HISTORY — PX: BELOW KNEE LEG AMPUTATION: SUR23

## 2011-06-27 LAB — GLUCOSE, CAPILLARY
Glucose-Capillary: 243 mg/dL — ABNORMAL HIGH (ref 70–99)
Glucose-Capillary: 157 mg/dL — ABNORMAL HIGH (ref 70–99)

## 2011-06-28 LAB — CBC
Hemoglobin: 8.6 g/dL — ABNORMAL LOW (ref 13.0–17.0)
MCHC: 32.7 g/dL (ref 30.0–36.0)
RDW: 13.7 % (ref 11.5–15.5)
WBC: 15.9 10*3/uL — ABNORMAL HIGH (ref 4.0–10.5)

## 2011-06-28 LAB — GLUCOSE, CAPILLARY
Glucose-Capillary: 160 mg/dL — ABNORMAL HIGH (ref 70–99)
Glucose-Capillary: 179 mg/dL — ABNORMAL HIGH (ref 70–99)

## 2011-06-28 LAB — BASIC METABOLIC PANEL
GFR calc Af Amer: 55 mL/min — ABNORMAL LOW (ref >60)
GFR calc non Af Amer: 45 mL/min — ABNORMAL LOW (ref >60)
Potassium: 3.5 mEq/L (ref 3.5–5.1)
Sodium: 136 mEq/L (ref 135–145)

## 2011-06-29 LAB — CBC
HCT: 26.6 % — ABNORMAL LOW (ref 39.0–52.0)
MCH: 27.5 pg (ref 26.0–34.0)
MCHC: 32.3 g/dL (ref 30.0–36.0)
MCV: 85 fL (ref 78.0–100.0)
RDW: 13.9 % (ref 11.5–15.5)

## 2011-06-29 LAB — GLUCOSE, CAPILLARY: Glucose-Capillary: 144 mg/dL — ABNORMAL HIGH (ref 70–99)

## 2011-06-30 DIAGNOSIS — I739 Peripheral vascular disease, unspecified: Secondary | ICD-10-CM

## 2011-06-30 DIAGNOSIS — S88119A Complete traumatic amputation at level between knee and ankle, unspecified lower leg, initial encounter: Secondary | ICD-10-CM

## 2011-06-30 DIAGNOSIS — L98499 Non-pressure chronic ulcer of skin of other sites with unspecified severity: Secondary | ICD-10-CM

## 2011-06-30 LAB — GLUCOSE, CAPILLARY: Glucose-Capillary: 173 mg/dL — ABNORMAL HIGH (ref 70–99)

## 2011-07-01 LAB — GLUCOSE, CAPILLARY
Glucose-Capillary: 178 mg/dL — ABNORMAL HIGH (ref 70–99)
Glucose-Capillary: 246 mg/dL — ABNORMAL HIGH (ref 70–99)

## 2011-07-01 LAB — PROTIME-INR: INR: 1.23 (ref 0.00–1.49)

## 2011-07-01 NOTE — H&P (Addendum)
[name], [name]               ACCOUNT NO.:  [id number]  MEDICAL RECORD NO.:  [id number]  LOCATION:  2028                         FACILITY:  [facility]  PHYSICIAN:  [name]. Jamaal Bernasconi, MD  DATE OF BIRTH:  03-Dec-1945  DATE OF ADMISSION:  06/23/2011 DATE OF DISCHARGE:                             HISTORY & PHYSICAL   CHIEF COMPLAINT:  Increasing drainage and pain in the left foot gangrenous toes.  HISTORY OF PRESENT ILLNESS:  [name] is a 65 year old gentleman well known to [facility] Service who has had multiple procedures for peripheral vascular disease.  He has had a right axis bifemoral, right BKA, right AKA and also a left fem-pop bypass with greater saphenous vein by Dr. Darrick Penna in November 2006.  The patient developed gangrene in the third toe and a gangrenous eschar over the plantar surface of below toes 3 to 5 with blackened eschar.  Three weeks ago he had a great toenail removed by his primary care physician for drainage under the nail.  He was seen by Dr. Darrick Penna on June 05, 2011, in followup for the gangrenous toes which at that time had been improving.  He was supposed to see [place] back in 3 weeks.  However, last week the patient noted increased redness at the great toe and increased drainage over the gangrenous areas of the foot.  He complained of a rest pain in the foot but no night pain.  He has peripheral neuropathy.  He denies any fever or chills.  He states that venous stasis changes in his left lower extremity are unchanged. He had a vascular lab done on May 29, 2011, which showed the left fem- pop bypass patent with no stenoses and he did have velocities of 241 cm in the left common femoral artery.  The patient is being admitted for further workup and IV antibiotics.  LABORATORY FINDINGS:  Sodium is 137, potassium is 3.8, BUN of 22, creatinine is 1.44, his glucose is 301.  PT 15.5 and INR is 1.209. Hemoglobin and hematocrit 12.6 and 38 with a white count of  15.5, platelet count is 343.  Culture of the wound is pending.  The patient had an angiogram in March 2009 which showed primary runoff of the peroneal with an anterior tib runoff to the dorsalis pedis.  PAST MEDICAL HISTORY:  Significant for diabetes, anemia, coronary artery disease status post CABG, atrial fib, the patient on Coumadin, history of GI bleed, hypertension, chronic renal insufficiency and COPD.  MEDICATIONS:  There is a list which reviewed beginning of August in epic, the patient is on 1.5 mg of Coumadin daily.  SOCIAL HISTORY:  The patient is widowed.  He is smoker and he has smoked as much as 3 packs per day in the past.  REVIEW OF SYSTEMS:  Negative except for occasional nosebleeds, some of which are quite extensive.  He has positive wounds and gangrene as above in history present illness.  He denies any shortness of breath, chest pain, abdominal pain, black tarry stools or blood in the stools.  He denies any hemoptysis.  On physical exam; this is a well-developed, well-nourished gentleman in no acute distress.  Blood pressure is 131/31,  heart rate was 88, temperature was 98.9, sats were 96% on room air.  Neurologically, he is alert and oriented x3.  His speech was normal.  He is moving all extremities well.  Musculoskeletal, he had a right above-knee amputation.  Left lower extremity, he had a palpable femoral pulse.  His thigh was warm, well-perfused and he has peripheral neuropathy.  He has some venous stasis changes in the leg to just below the knee.  Lungs are clear without wheezes, rales or rhonchi.  Heart rate and rhythm were irregular with no murmurs or rubs.  HEENT, pupils were equal and round.  Abdomen was soft and nontender, normoactive bowel sounds. Left lower extremity, he had good peroneal signal with a Doppler.  He also had a same DP signal with Doppler.  He had no posterior tibial Doppler as this was occluded per angiogram.  His foot was warm  and hyperemic.  First, third and fourth toes had some gangrenous changes with a blackened eschar at the base of the third through fifth toes which was odorous but had minimal drainage.  ASSESSMENT AND PLAN:  Gangrene and rest pain with increased drainage and redness in the third, fourth toes in the base of the foot.  The patient with a previously patent left fem-pop bypass.  Plan is to admit the patient for IV antibiotics.  We will Vascular Lab assessed patency of the graft.  He will possibly need another angiogram to assess the distal vasculature to see if any further intervention would be possible. Otherwise, the patient may need an amputation.     Della Goo, PA-C   ______________________________ [name] Javia Dillow, MD    RR/MEDQ  D:  06/23/2011  T:  06/24/2011  Job:  [id number]  Electronically Signed by Della Goo PA on 07/12/2011 10:54:33 AM Electronically Signed by Fabienne Bruns MD on 07/30/2011 11:39:47 AM

## 2011-07-08 NOTE — Discharge Summary (Addendum)
[name], [name]               ACCOUNT NO.:  [id number]  MEDICAL RECORD NO.:  [id number]  LOCATION:  2028                         FACILITY:  [facility]  PHYSICIAN:  [name]. Quanell Loughney, MD  DATE OF BIRTH:  [birth year]  DATE OF ADMISSION:  06/23/2011 DATE OF DISCHARGE:  07/01/2011                              DISCHARGE SUMMARY   CHIEF COMPLAINT:  Increasing drainage and pain in the left foot gangrenous toes.  HISTORY OF PRESENT ILLNESS:  [name] is a 65 year old gentleman, well known to the Vascular Service, who has had multiple procedures for peripheral vascular disease.  He had a right axillobifemoral bypass, a right BKA followed by a right AKA, and also has had a left fem-pop bypass with greater saphenous vein by Dr. Darrick Penna, this last was done in November 2006.  The patient developed gangrene of the third toe with gangrenous eschar over the plantar surface, below toes 3 through 5 with a blackened eschar.  He has also had his great toe nail removed 3 weeks ago by his primary care physician for drainage under the nail.  He was seen by Dr. Darrick Penna on June 05, 2011, for followup of the gangrenous toes which was improving.  However, the week prior to admission, he had noted increasing drainage and the eschar which was becoming larger at the plantar surface of the foot.  He also had increasing redness of the great toe and increasing drainage over the gangrenous areas.  He complained of rest pain in the foot, but no night pain.  He has peripheral neuropathy.  He was admitted on June 23, 2011, for further workup.  He denied any fever or chills at that time.  PAST MEDICAL HISTORY:  Significant for insulin-dependent diabetes type 2, anemia, coronary artery disease status post CABG, atrial fib on Coumadin, history of GI bleed, hypertension, chronic renal insufficiency, and COPD.  ALLERGIES:  He had no known drug allergies.  He is only on Coumadin.  MEDICATIONS:  Coumadin 1.5 mg  daily.  He is on multiple other medications for his various medical issues as seen in medical note.  HOSPITAL COURSE:  The patient was admitted to the hospital, placed on IV antibiotics, and taken to the operating room on June 24, 2011, for an open transmetatarsal amputation of the left foot toes 3 through 5.  Of note, there was no active bleeding at the time of surgery.  He had minimal drainage at the edges of the wound which were dusky.  It was felt that this would not heal and the patient was scheduled for a left BKA on June 27, 2011.  The patient was taken to the operating room that day for successful left BKA with good muscle tissue noted. Postoperatively, the patient did well.  Pain was well-controlled.  His wounds were healing well.  He was seen by Physical Therapy.  He was seen by Rehab for their program for further physical therapy prior to being discharged home.  He has good family support at home.  He will be followed by Dr. Darrick Penna in approximately 3 weeks for staple removal.  DISCHARGE MEDICATIONS: 1. Benadryl 25-50 mg daily at bedtime as needed for sleep. 2.  Ferrous sulfate 325 mg twice daily. 3. Subcu heparin 5000 units every 8 hours, this will be discontinued     when the patient's INR is greater than 2. 4. Milk of Magnesia 30 mL as needed for constipation. 5. Oxycodone 5 mg 1-2 tablets every 4-6 hours as needed for pain, #30     were given. 6. Coumadin 1.5 mg daily.  This is being followed by pharmacy. 7. Benazepril/hydrochlorothiazide 10/12.5 mg daily. 8. Crestor 20 mg twice a week on Monday and Thursday. 9. Digoxin 0.125 mg daily. 10.Colace 100 mg daily. 11.Fenofibrate 160 mg daily. 12.Fish oil 1000 mg twice daily. 13.Furosemide 40 mg daily. 14.Glimepiride 4 mg daily. 15.Lantus insulin 18 units daily. 16.NovoLog insulin 3 units 3 times a day with meals. 17.Metoprolol 25 mg twice daily. 18.Multivitamins daily. 19.Omeprazole 20 mg daily. 20.Synthroid 50 mcg  daily.  FINAL DIAGNOSES: 1. Gangrene of the left lower extremity with patent left fem-pop     bypass status post left below-knee amputation. 2. Hypertension, diabetes, and all his other medical issues were well     controlled with his previous medications.  Some changes were made     to his insulin dose by his medical doctors.  DISPOSITION: 1. The patient will be discharged home.  He will follow up with Dr.     Darrick Penna in approximately 3 weeks for staple removal. 2. He sees [facility]cal at [facility] for his prosthetic needs     and he will follow up with him as an outpatient. 3. The patient will be transferred to Inpatient Rehab when they have a     bed available.     Manuel Goo, PA-C   ______________________________ [name] Veola Cafaro, MD    RR/MEDQ  D:  07/01/2011  T:  07/01/2011  Job:  [id number]  Electronically Signed by Manuel Goo PA on 07/08/2011 11:08:47 AM Electronically Signed by Fabienne Bruns MD on 07/30/2011 11:39:37 AM

## 2011-07-21 LAB — COMPREHENSIVE METABOLIC PANEL
BUN: 33 — ABNORMAL HIGH
CO2: 22
Calcium: 8.8
Chloride: 103
Creatinine, Ser: 1.14
GFR calc non Af Amer: >60
Total Bilirubin: 0.7

## 2011-07-21 LAB — CBC
HCT: 44.6
MCHC: 34.6
MCV: 86.7
Platelets: 161
RBC: 5.14
WBC: 12 — ABNORMAL HIGH

## 2011-07-21 LAB — APTT: aPTT: 30

## 2011-07-21 LAB — PROTIME-INR: INR: 1

## 2011-07-29 ENCOUNTER — Encounter: Payer: Self-pay | Admitting: Thoracic Diseases

## 2011-07-30 ENCOUNTER — Encounter: Payer: Self-pay | Admitting: Thoracic Diseases

## 2011-07-30 NOTE — Op Note (Signed)
NAMEFEDOR, KAZMIERSKI               ACCOUNT NO.:  [id number]  MEDICAL RECORD NO.:  [id number]  LOCATION:  2028                         FACILITY:  [facility]  PHYSICIAN:  [name]. Shenise Wolgamott, MD  DATE OF BIRTH:  10-14-1946  DATE OF PROCEDURE:  06/27/2011 DATE OF DISCHARGE:                              OPERATIVE REPORT   PROCEDURE:  Left below-knee amputation.  PREOPERATIVE DIAGNOSIS:  Nonhealing wound, left foot.  POSTOPERATIVE DIAGNOSIS:  Nonhealing wound, left foot.  ANESTHESIA:  General.  ASSISTANT:  Billee Cashing, RNFA  OPERATIVE FINDINGS:  Reasonable perfusion with calcified vessels, left leg.  SPECIMENS:  Left leg.  OPERATIVE DETAILS:  After obtaining informed consent, the patient was taken to the operating room.  The patient was placed in supine position on the operating table.  After induction of general anesthesia and endotracheal intubation, the patient's entire left lower extremity was prepped and draped in usual sterile fashion.  Next, a transverse incision was made on the anterior aspect of the left leg approximately 5 fingerbreadths below the tibial tuberosity.  Incision was carried posterior to the level of the fibula in the midportion of the medial portion of the left leg.  The incision was then extended longitudinally to create a posterior flap.  Incision was carried down through subcutaneous tissues.  There is a fair amount of venous hypertension and venous backbleeding.  This was controlled with cautery.  The incision was extended longitudinally through the subcutaneous tissues in the fascia and down in to the muscle tissue.  Periosteum was then raised on the tibia several centimeters above the skin edge.  Periosteum was also raised on the fibula for several centimeters.  A bone cutter was then used to divide the fibula and a saw was used to divide the tibia several centimeters above the skin edge.  The leg was then elevated in the operative field and an amputation  knife was used to complete the amputation posterior to the bones.  The leg was passed off the table as a specimen.  Next, hemostasis was obtained with several silk ties and suture ligatures.  The wound was thoroughly irrigated with normal saline solution.  The fascial edges were reapproximated using interrupted 2-0 Vicryl sutures.  However, the posterior flap seemed fairly tight and had some tension under it, started to close the skin edges with interrupted 3-0 vertical mattress nylon sutures, but I was not happy with the amount of tension on the skin edge.  Therefore, the 2-0 Vicryl sutures in the nylon sutures were removed and some of the muscle tissue along the posterior flap as well as some of the anterior compartment muscles were transected back, so that there was less tension on the skin closure.  I then again placed multiple interrupted 2-0 Vicryl sutures to reapproximate the fascia anterior to posterior.  A 3-0 Vicryl subcutaneous stitch was placed in the subcutaneous tissues.  Skin was then closed with staples.  The patient tolerated the procedure well and there were no complications. Instrument, sponge, and needle counts were correct at the end of the case.  The patient was taken to recovery room in stable condition.     [name]. Darrick Penna, MD  CEF/MEDQ  D:  06/27/2011  T:  06/27/2011  Job:  [id number]  cc:   Jeannett Senior A. Evlyn Kanner, M.D.  Electronically Signed by Fabienne Bruns MD on 07/30/2011 11:39:43 AM

## 2011-07-31 ENCOUNTER — Ambulatory Visit (INDEPENDENT_AMBULATORY_CARE_PROVIDER_SITE_OTHER): Payer: Medicare Other | Admitting: Thoracic Diseases

## 2011-07-31 ENCOUNTER — Encounter: Payer: Self-pay | Admitting: Thoracic Diseases

## 2011-07-31 VITALS — BP 127/42 | HR 64 | Resp 20 | Ht 71.0 in | Wt 190.0 lb

## 2011-07-31 DIAGNOSIS — I739 Peripheral vascular disease, unspecified: Secondary | ICD-10-CM

## 2011-07-31 DIAGNOSIS — S88119A Complete traumatic amputation at level between knee and ankle, unspecified lower leg, initial encounter: Secondary | ICD-10-CM

## 2011-07-31 DIAGNOSIS — Z89519 Acquired absence of unspecified leg below knee: Secondary | ICD-10-CM

## 2011-07-31 NOTE — Progress Notes (Signed)
[facility]  Postoperative Visit - Amputation Date of Surgery:  Surgeon: CE Fields, MD  History of Present Illness  [name] is a 65 y.o. male who presents for postoperative follow-up WUJ:WJXB BKA: edema control with ace wraps or stump shrinker.  The patient's denies drainage or redness at wound. Pt.denies increased pain in the stump. The patient notes pain is well controlled.  The patient's current symptoms are: none.  Pt had small abrasion over patella that is well healed  Physical Examination  Filed Vitals:   07/31/11 1306  BP: 127/42  Pulse: 64  Resp: 20    WDWN male with no complaints  Left amputation wound is clean, dry and healing.  There is good  bone coverage with the stump flap Stump is warm and well perfused, without drainage; without erythema  Staples were removed without difficulty  Assessment/plan:  [name] is a 65 y.o. male who is s/p left BKA: prevent flexion contracture by continuing exercises for quadricepts muscles .  The patient's stump is viable.  The patient has been referred for prosthetic fitting.  The patient can follow up with [place] as needed.  Clinic MD: CE Darrick Penna, MD

## 2011-07-31 NOTE — Patient Instructions (Signed)
Pt. Has appointment with prosthetist tomorrow for stump shrinker

## 2011-10-08 ENCOUNTER — Ambulatory Visit (HOSPITAL_COMMUNITY)
Admission: RE | Admit: 2011-10-08 | Discharge: 2011-10-08 | Payer: Medicare Other | Source: Ambulatory Visit | Attending: Endocrinology | Admitting: Endocrinology

## 2011-10-08 DIAGNOSIS — R0989 Other specified symptoms and signs involving the circulatory and respiratory systems: Secondary | ICD-10-CM

## 2011-10-08 DIAGNOSIS — I639 Cerebral infarction, unspecified: Secondary | ICD-10-CM

## 2013-02-21 ENCOUNTER — Encounter (HOSPITAL_COMMUNITY): Payer: Self-pay | Admitting: *Deleted

## 2013-02-21 ENCOUNTER — Emergency Department (HOSPITAL_COMMUNITY): Payer: Medicare Other

## 2013-02-21 ENCOUNTER — Inpatient Hospital Stay (HOSPITAL_COMMUNITY)
Admission: EM | Admit: 2013-02-21 | Discharge: 2013-02-24 | DRG: 308 | Disposition: A | Discharge status: Home / Self Care | Payer: Medicare Other | Attending: Endocrinology | Admitting: Endocrinology

## 2013-02-21 ENCOUNTER — Inpatient Hospital Stay (HOSPITAL_COMMUNITY): Payer: Medicare Other

## 2013-02-21 DIAGNOSIS — N059 Unspecified nephritic syndrome with unspecified morphologic changes: Secondary | ICD-10-CM | POA: Diagnosis present

## 2013-02-21 DIAGNOSIS — E781 Pure hyperglyceridemia: Secondary | ICD-10-CM | POA: Diagnosis present

## 2013-02-21 DIAGNOSIS — S88119A Complete traumatic amputation at level between knee and ankle, unspecified lower leg, initial encounter: Secondary | ICD-10-CM

## 2013-02-21 DIAGNOSIS — N179 Acute kidney failure, unspecified: Secondary | ICD-10-CM | POA: Diagnosis present

## 2013-02-21 DIAGNOSIS — J449 Chronic obstructive pulmonary disease, unspecified: Secondary | ICD-10-CM | POA: Diagnosis present

## 2013-02-21 DIAGNOSIS — E1139 Type 2 diabetes mellitus with other diabetic ophthalmic complication: Secondary | ICD-10-CM | POA: Diagnosis present

## 2013-02-21 DIAGNOSIS — I129 Hypertensive chronic kidney disease with stage 1 through stage 4 chronic kidney disease, or unspecified chronic kidney disease: Secondary | ICD-10-CM | POA: Diagnosis present

## 2013-02-21 DIAGNOSIS — E785 Hyperlipidemia, unspecified: Secondary | ICD-10-CM | POA: Diagnosis present

## 2013-02-21 DIAGNOSIS — R410 Disorientation, unspecified: Secondary | ICD-10-CM

## 2013-02-21 DIAGNOSIS — E11319 Type 2 diabetes mellitus with unspecified diabetic retinopathy without macular edema: Secondary | ICD-10-CM | POA: Diagnosis present

## 2013-02-21 DIAGNOSIS — K219 Gastro-esophageal reflux disease without esophagitis: Secondary | ICD-10-CM | POA: Diagnosis present

## 2013-02-21 DIAGNOSIS — Z951 Presence of aortocoronary bypass graft: Secondary | ICD-10-CM

## 2013-02-21 DIAGNOSIS — I6350 Cerebral infarction due to unspecified occlusion or stenosis of unspecified cerebral artery: Secondary | ICD-10-CM | POA: Diagnosis present

## 2013-02-21 DIAGNOSIS — I739 Peripheral vascular disease, unspecified: Secondary | ICD-10-CM | POA: Diagnosis present

## 2013-02-21 DIAGNOSIS — R112 Nausea with vomiting, unspecified: Secondary | ICD-10-CM

## 2013-02-21 DIAGNOSIS — E1165 Type 2 diabetes mellitus with hyperglycemia: Secondary | ICD-10-CM | POA: Diagnosis present

## 2013-02-21 DIAGNOSIS — J4489 Other specified chronic obstructive pulmonary disease: Secondary | ICD-10-CM | POA: Diagnosis present

## 2013-02-21 DIAGNOSIS — Z7901 Long term (current) use of anticoagulants: Secondary | ICD-10-CM

## 2013-02-21 DIAGNOSIS — Z79899 Other long term (current) drug therapy: Secondary | ICD-10-CM

## 2013-02-21 DIAGNOSIS — N189 Chronic kidney disease, unspecified: Secondary | ICD-10-CM | POA: Diagnosis present

## 2013-02-21 DIAGNOSIS — E039 Hypothyroidism, unspecified: Secondary | ICD-10-CM | POA: Diagnosis present

## 2013-02-21 DIAGNOSIS — D72829 Elevated white blood cell count, unspecified: Secondary | ICD-10-CM | POA: Diagnosis present

## 2013-02-21 DIAGNOSIS — I2510 Atherosclerotic heart disease of native coronary artery without angina pectoris: Secondary | ICD-10-CM | POA: Diagnosis present

## 2013-02-21 DIAGNOSIS — E1129 Type 2 diabetes mellitus with other diabetic kidney complication: Secondary | ICD-10-CM | POA: Diagnosis present

## 2013-02-21 DIAGNOSIS — I959 Hypotension, unspecified: Secondary | ICD-10-CM | POA: Diagnosis present

## 2013-02-21 DIAGNOSIS — I69959 Hemiplegia and hemiparesis following unspecified cerebrovascular disease affecting unspecified side: Secondary | ICD-10-CM

## 2013-02-21 DIAGNOSIS — I4891 Unspecified atrial fibrillation: Principal | ICD-10-CM | POA: Diagnosis present

## 2013-02-21 DIAGNOSIS — F17200 Nicotine dependence, unspecified, uncomplicated: Secondary | ICD-10-CM | POA: Diagnosis present

## 2013-02-21 DIAGNOSIS — N058 Unspecified nephritic syndrome with other morphologic changes: Secondary | ICD-10-CM | POA: Diagnosis present

## 2013-02-21 DIAGNOSIS — S78119A Complete traumatic amputation at level between unspecified hip and knee, initial encounter: Secondary | ICD-10-CM

## 2013-02-21 LAB — CBC WITH DIFFERENTIAL/PLATELET
Basophils Absolute: 0 10*3/uL (ref 0.0–0.1)
Basophils Relative: 0 % (ref 0–1)
MCHC: 34.8 g/dL (ref 30.0–36.0)
Neutro Abs: 18.9 10*3/uL — ABNORMAL HIGH (ref 1.7–7.7)
Neutrophils Relative %: 92 % — ABNORMAL HIGH (ref 43–77)
Platelets: 156 10*3/uL (ref 150–400)
RDW: 14.4 % (ref 11.5–15.5)
WBC: 20.6 10*3/uL — ABNORMAL HIGH (ref 4.0–10.5)

## 2013-02-21 LAB — URINE MICROSCOPIC-ADD ON

## 2013-02-21 LAB — OCCULT BLOOD, POC DEVICE: Fecal Occult Bld: NEGATIVE

## 2013-02-21 LAB — COMPREHENSIVE METABOLIC PANEL
AST: 16 U/L (ref 0–37)
Albumin: 3.3 g/dL — ABNORMAL LOW (ref 3.5–5.2)
Chloride: 95 mEq/L — ABNORMAL LOW (ref 96–112)
Creatinine, Ser: 1.85 mg/dL — ABNORMAL HIGH (ref 0.50–1.35)
GFR calc Af Amer: 42 mL/min — ABNORMAL LOW (ref >90)
GFR calc non Af Amer: 36 mL/min — ABNORMAL LOW (ref >90)
Potassium: 5 mEq/L (ref 3.5–5.1)
Sodium: 134 mEq/L — ABNORMAL LOW (ref 135–145)

## 2013-02-21 LAB — URINALYSIS, ROUTINE W REFLEX MICROSCOPIC
Hgb urine dipstick: NEGATIVE
Ketones, ur: 15 mg/dL — AB
Nitrite: NEGATIVE
Specific Gravity, Urine: 1.025 (ref 1.005–1.030)
pH: 5 (ref 5.0–8.0)

## 2013-02-21 LAB — LIPASE, BLOOD: Lipase: 15 U/L (ref 11–59)

## 2013-02-21 LAB — GLUCOSE, CAPILLARY: Glucose-Capillary: 454 mg/dL — ABNORMAL HIGH (ref 70–99)

## 2013-02-21 LAB — PROTIME-INR: INR: 1.05 (ref 0.00–1.49)

## 2013-02-21 LAB — APTT: aPTT: 31 seconds (ref 24–37)

## 2013-02-21 MED ORDER — INSULIN ASPART 100 UNIT/ML ~~LOC~~ SOLN
0.0000 [IU] | Freq: Three times a day (TID) | SUBCUTANEOUS | Status: DC
  Administered 2013-02-22: 3 [IU] via SUBCUTANEOUS
  Administered 2013-02-22 (×2): 5 [IU] via SUBCUTANEOUS
  Administered 2013-02-23 (×3): 3 [IU] via SUBCUTANEOUS
  Administered 2013-02-24: 5 [IU] via SUBCUTANEOUS
  Administered 2013-02-24: 3 [IU] via SUBCUTANEOUS

## 2013-02-21 MED ORDER — INSULIN ASPART 100 UNIT/ML ~~LOC~~ SOLN
10.0000 [IU] | Freq: Once | SUBCUTANEOUS | Status: AC
  Administered 2013-02-21: 10 [IU] via INTRAVENOUS
  Filled 2013-02-21: qty 1

## 2013-02-21 MED ORDER — SODIUM CHLORIDE 0.9 % IV SOLN
INTRAVENOUS | Status: DC
  Administered 2013-02-23: 15:00:00 via INTRAVENOUS

## 2013-02-21 MED ORDER — SIMVASTATIN 10 MG PO TABS
10.0000 mg | ORAL_TABLET | Freq: Every day | ORAL | Status: DC
  Administered 2013-02-21: 10 mg via ORAL
  Filled 2013-02-21 (×2): qty 1

## 2013-02-21 MED ORDER — HEPARIN (PORCINE) IN NACL 100-0.45 UNIT/ML-% IJ SOLN
1200.0000 [IU]/h | INTRAMUSCULAR | Status: DC
  Administered 2013-02-21: 1000 [IU]/h via INTRAVENOUS
  Administered 2013-02-22: 1200 [IU]/h via INTRAVENOUS
  Filled 2013-02-21 (×3): qty 250

## 2013-02-21 MED ORDER — WARFARIN - PHARMACIST DOSING INPATIENT: Freq: Every day | Status: DC

## 2013-02-21 MED ORDER — FENOFIBRATE 160 MG PO TABS
160.0000 mg | ORAL_TABLET | Freq: Every day | ORAL | Status: DC
  Administered 2013-02-21 – 2013-02-24 (×4): 160 mg via ORAL
  Filled 2013-02-21 (×4): qty 1

## 2013-02-21 MED ORDER — ONDANSETRON HCL 4 MG/2ML IJ SOLN: 4.0000 mg | Freq: Four times a day (QID) | INTRAMUSCULAR | Status: DC | PRN

## 2013-02-21 MED ORDER — ONDANSETRON HCL 4 MG/2ML IJ SOLN
4.0000 mg | Freq: Once | INTRAMUSCULAR | Status: AC
  Administered 2013-02-21: 4 mg via INTRAVENOUS
  Filled 2013-02-21: qty 2

## 2013-02-21 MED ORDER — SODIUM CHLORIDE 0.9 % IV SOLN
INTRAVENOUS | Status: DC
  Administered 2013-02-21 – 2013-02-24 (×3): via INTRAVENOUS

## 2013-02-21 MED ORDER — METOCLOPRAMIDE HCL 5 MG/ML IJ SOLN
10.0000 mg | Freq: Once | INTRAMUSCULAR | Status: AC
  Administered 2013-02-21: 10 mg via INTRAVENOUS
  Filled 2013-02-21: qty 2

## 2013-02-21 MED ORDER — ADULT MULTIVITAMIN W/MINERALS CH
1.0000 | ORAL_TABLET | Freq: Every day | ORAL | Status: DC
  Administered 2013-02-21 – 2013-02-24 (×4): 1 via ORAL
  Filled 2013-02-21 (×4): qty 1

## 2013-02-21 MED ORDER — ACETAMINOPHEN 650 MG RE SUPP: 650.0000 mg | Freq: Four times a day (QID) | RECTAL | Status: DC | PRN

## 2013-02-21 MED ORDER — DIGOXIN 125 MCG PO TABS
0.1250 mg | ORAL_TABLET | Freq: Every day | ORAL | Status: DC
  Administered 2013-02-21 – 2013-02-23 (×3): 0.125 mg via ORAL
  Filled 2013-02-21 (×2): qty 1

## 2013-02-21 MED ORDER — LORAZEPAM 2 MG/ML IJ SOLN
0.5000 mg | Freq: Once | INTRAMUSCULAR | Status: AC
  Administered 2013-02-21: 0.5 mg via INTRAVENOUS
  Filled 2013-02-21: qty 1

## 2013-02-21 MED ORDER — SODIUM CHLORIDE 0.9 % IV BOLUS (SEPSIS)
1000.0000 mL | Freq: Once | INTRAVENOUS | Status: AC
  Administered 2013-02-21: 1000 mL via INTRAVENOUS

## 2013-02-21 MED ORDER — INSULIN GLARGINE 100 UNIT/ML ~~LOC~~ SOLN
10.0000 [IU] | Freq: Once | SUBCUTANEOUS | Status: AC
  Administered 2013-02-22: 10 [IU] via SUBCUTANEOUS
  Filled 2013-02-21 (×3): qty 0.1

## 2013-02-21 MED ORDER — METOPROLOL TARTRATE 12.5 MG HALF TABLET
12.5000 mg | ORAL_TABLET | Freq: Every day | ORAL | Status: DC
  Administered 2013-02-21 – 2013-02-24 (×4): 12.5 mg via ORAL
  Filled 2013-02-21: qty 0.5
  Filled 2013-02-21: qty 1
  Filled 2013-02-21 (×2): qty 0.5

## 2013-02-21 MED ORDER — DILTIAZEM HCL 100 MG IV SOLR
5.0000 mg/h | INTRAVENOUS | Status: DC
  Administered 2013-02-21 (×2): 5 mg/h via INTRAVENOUS
  Filled 2013-02-21 (×2): qty 100

## 2013-02-21 MED ORDER — SODIUM CHLORIDE 0.9 % IV SOLN
Freq: Once | INTRAVENOUS | Status: AC
  Administered 2013-02-21: 15:00:00 via INTRAVENOUS

## 2013-02-21 MED ORDER — DIGOXIN 125 MCG PO TABS
125.0000 ug | ORAL_TABLET | Freq: Every day | ORAL | Status: DC
  Administered 2013-02-24: 125 ug via ORAL
  Filled 2013-02-21 (×4): qty 1

## 2013-02-21 MED ORDER — PANTOPRAZOLE SODIUM 40 MG PO TBEC
40.0000 mg | DELAYED_RELEASE_TABLET | Freq: Every day | ORAL | Status: DC
  Administered 2013-02-21 – 2013-02-24 (×4): 40 mg via ORAL
  Filled 2013-02-21 (×4): qty 1

## 2013-02-21 MED ORDER — DOCUSATE SODIUM 100 MG PO CAPS
100.0000 mg | ORAL_CAPSULE | Freq: Every day | ORAL | Status: DC
  Administered 2013-02-21 – 2013-02-24 (×3): 100 mg via ORAL
  Filled 2013-02-21 (×4): qty 1

## 2013-02-21 MED ORDER — INSULIN ASPART 100 UNIT/ML ~~LOC~~ SOLN
5.0000 [IU] | Freq: Once | SUBCUTANEOUS | Status: AC
  Administered 2013-02-21: 5 [IU] via SUBCUTANEOUS

## 2013-02-21 MED ORDER — FUROSEMIDE 20 MG PO TABS
20.0000 mg | ORAL_TABLET | Freq: Every day | ORAL | Status: DC
  Administered 2013-02-21 – 2013-02-24 (×4): 20 mg via ORAL
  Filled 2013-02-21 (×4): qty 1

## 2013-02-21 MED ORDER — SODIUM CHLORIDE 0.9 % IJ SOLN
3.0000 mL | Freq: Two times a day (BID) | INTRAMUSCULAR | Status: DC
  Administered 2013-02-21 – 2013-02-23 (×4): 3 mL via INTRAVENOUS

## 2013-02-21 MED ORDER — ONE-DAILY MULTI VITAMINS PO TABS: 1.0000 | ORAL_TABLET | Freq: Every day | ORAL | Status: DC

## 2013-02-21 MED ORDER — ENOXAPARIN SODIUM 30 MG/0.3ML ~~LOC~~ SOLN: 30.0000 mg | SUBCUTANEOUS | Status: DC

## 2013-02-21 MED ORDER — LEVOTHYROXINE SODIUM 50 MCG PO TABS
50.0000 ug | ORAL_TABLET | Freq: Every day | ORAL | Status: DC
  Administered 2013-02-24: 50 ug via ORAL
  Filled 2013-02-21 (×4): qty 1

## 2013-02-21 MED ORDER — LEVOTHYROXINE SODIUM 50 MCG PO TABS
50.0000 ug | ORAL_TABLET | Freq: Every day | ORAL | Status: DC
  Administered 2013-02-22 – 2013-02-23 (×2): 50 ug via ORAL
  Filled 2013-02-21 (×3): qty 1

## 2013-02-21 MED ORDER — WARFARIN SODIUM 7.5 MG PO TABS
7.5000 mg | ORAL_TABLET | Freq: Once | ORAL | Status: AC
  Administered 2013-02-21: 7.5 mg via ORAL
  Filled 2013-02-21 (×2): qty 1

## 2013-02-21 MED ORDER — INSULIN GLARGINE 100 UNIT/ML ~~LOC~~ SOLN
10.0000 [IU] | Freq: Every day | SUBCUTANEOUS | Status: DC
  Administered 2013-02-21: 10 [IU] via SUBCUTANEOUS
  Filled 2013-02-21 (×3): qty 0.1

## 2013-02-21 MED ORDER — OMEGA-3-ACID ETHYL ESTERS 1 G PO CAPS
2.0000 g | ORAL_CAPSULE | Freq: Two times a day (BID) | ORAL | Status: DC
  Administered 2013-02-21 – 2013-02-24 (×6): 2 g via ORAL
  Filled 2013-02-21 (×7): qty 2

## 2013-02-21 MED ORDER — INSULIN ASPART 100 UNIT/ML ~~LOC~~ SOLN
0.0000 [IU] | Freq: Every day | SUBCUTANEOUS | Status: DC
  Administered 2013-02-21: 5 [IU] via SUBCUTANEOUS

## 2013-02-21 MED ORDER — ONDANSETRON HCL 4 MG PO TABS: 4.0000 mg | ORAL_TABLET | Freq: Four times a day (QID) | ORAL | Status: DC | PRN

## 2013-02-21 MED ORDER — ACETAMINOPHEN 325 MG PO TABS: 650.0000 mg | ORAL_TABLET | Freq: Four times a day (QID) | ORAL | Status: DC | PRN

## 2013-02-21 MED ORDER — ATORVASTATIN CALCIUM 80 MG PO TABS
80.0000 mg | ORAL_TABLET | Freq: Every day | ORAL | Status: DC
  Administered 2013-02-22 – 2013-02-23 (×2): 80 mg via ORAL
  Filled 2013-02-21 (×3): qty 1

## 2013-02-21 MED ORDER — OMEGA-3 FATTY ACIDS 1000 MG PO CAPS: 2.0000 g | ORAL_CAPSULE | Freq: Two times a day (BID) | ORAL | Status: DC

## 2013-02-21 MED ORDER — SODIUM CHLORIDE 0.9 % IV SOLN: INTRAVENOUS | Status: DC

## 2013-02-21 NOTE — Progress Notes (Addendum)
ANTICOAGULATION CONSULT NOTE - Initial Consult  Pharmacy Consult for Coumadin Indication: atrial fibrillation  No Known Allergies  Vital Signs: Temp: 97.9 F (36.6 C) (04/28 0943) Temp src: Oral (04/28 0943) BP: 115/83 mmHg (04/28 1630) Pulse Rate: 74 (04/28 1630)  Labs:  Recent Labs  02/21/13 0953  HGB 16.4  HCT 47.1  PLT 156  APTT 31  LABPROT 13.6  INR 1.05  CREATININE 1.85*    The CrCl is unknown because both a height and weight (above a minimum accepted value) are required for this calculation.   Medical History: Past Medical History  Diagnosis Date  . Diabetes mellitus   . Anemia   . CAD (coronary artery disease)   . History of GI bleed   . PAD (peripheral artery disease)   . Atrial fibrillation   . Hypothyroidism   . Hyperlipidemia   . Hypertension   . History of hiatal hernia   . Chronic kidney disease     chronic renal insufficiency  . COPD (chronic obstructive pulmonary disease)   . Dehiscence of operative wound     Medications:   (Not in a hospital admission) Coumadin 4.5mg  daily  Assessment: 67 y/o male patient admitted with n/v and weakness, on chronic coumadin for h/o afib. INR subtherapeutic, last dose reported as yesterday. Will give higher dose than home regimen as INR is well below goal.  Goal of Therapy:  INR 2-3 Monitor platelets by anticoagulation protocol: Yes   Plan:  Coumadin 7.5mg  today and f/u daily protime.  Verlene Mayer, PharmD, BCPS Pager [phone] 02/21/2013,6:03 PM  Addendum:  Since INR is subtherapeutic, MD is bridging with IV heparin. Pt is a bilateral amputee.   Plan  Heparin drip at 1000 units/hr F/u with level in AM

## 2013-02-21 NOTE — ED Notes (Addendum)
Per [facility] pt came from home c/o nausea and vomiting X 1 day. States son reported at the house pt is "acting altered". CBG en route 539. 4mg  Zofran given en route. Pt c/o n/v/d and weakness since he woke this morning. Denies fever. A&O X 4.

## 2013-02-21 NOTE — H&P (Signed)
PCP:   Manuel Hy, MD   Chief Complaint:  Weakness, n/v   HPI:  [name] Brought in by son for N/V and more right sided weakness. Found in ER to be in Afib with RVR and now on cardiazem drip with some improvement. CT head shows old issues and MRI is underway. BS over 500 with acute renal failure superimposed on baseline nephropathy  Review of Systems:  Review of Systems -  Past Medical History: Past Medical History  Diagnosis Date  . Diabetes mellitus   . Anemia   . CAD (coronary artery disease)   . History of GI bleed   . PAD (peripheral artery disease)   . Atrial fibrillation   . Hypothyroidism   . Hyperlipidemia   . Hypertension   . History of hiatal hernia   . Chronic kidney disease     chronic renal insufficiency  . COPD (chronic obstructive pulmonary disease)   . Dehiscence of operative wound   Past Medical History (reviewed - no changes required): DM2 Diabetic retinopathy ASPVD Hyperlipidemia Chronic A-Fib CAD: Three vessel coronary artery disease with inferior hypokinesis. HTN 2002: TEE: There was moderate left ventricular dysfunction.  The left ventricle was moderately dilated with global hypokinesis and an ejection fraction estimated at 40%. 2007 ECHO:  Overall left ventricular systolic function was mildly to moderately decreased. Left ventricular ejection fraction was  estimated , range being 40 % to 50 %.  -  Aortic valve thickness was mildly increased. There was lower normal aortic valve leaflet excursion. The mean transaortic  valve gradient was 7 mmHg. Estimated aortic valve area (by Vmax) was 1.92 cm^2.  There was mild mitral annular calcification.  The left atrium was mild to moderately dilated.  There was a small, free-flowing pericardial effusion circumferential to the heart.  Renal insufficiency: 08/13:   BUN                  [H]  28 mg/dl                     6-96   CREATININE                1.1 mg/dl                   2.9-5.2  eGFR Non-African American                             67.0 H/O myopathy Hypothyroidism BPH  1. Sepsis in the setting of osteomyelitis now status post right above-       the-knee amputation.   2. Anemia with transfusion.   3. Diabetes type 2 with controlled blood sugars.   4. Diabetic nephropathy with acute kidney injury and now with       creatinine close to baseline.   5. Coronary disease with congestive heart failure without significant       flares.   6. Gastrointestinal bleeding, now clinically stable.   7. Paraphimosis, clinically improved after reduction.   8. Coagulopathy due to Coumadin 2010: gastric fundus biopsy showing mild active gastritis, no Helicobacter pylori CVA 2012 .    Past Surgical History  Procedure Laterality Date  . Femoral bypass  2004    Right w/ Goretex   by Dr. Josephina Abbott  . Femoral bypass  2003    Left  leg,   I& D of Lymphocele  . Coronary artery bypass graft  2002    By [name]  . 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 Leg amputation above knee  04/11/2010    Revision of right above knee amputation  Surgical History (reviewed - no changes required): CABG-2002 Thrombectomy right fem-pop bypass graftwith intraoperative arteriogram Right Axial femoral bypass graft Right foot debridment with right third toe amputation Right BKA, AKA 2010  Occlusion of the left internal iliac artery, stenosis of the right internal iliac artery, left superficial femoral occlusion.  He has had previous knee surgery in 1984 and left thumb surgery Past History Medications: Prior to Admission medications   Medication Sig Start Date End Date Taking? Authorizing Provider  benazepril-hydrochlorthiazide (LOTENSIN HCT) 10-12.5 MG per tablet Take 1 tablet by mouth daily.     Yes Historical Provider, MD  digoxin (LANOXIN) 0.125 MG tablet Take  125 mcg by mouth daily.     Yes Historical Provider, MD  docusate sodium (COLACE) 100 MG capsule Take 100 mg by mouth daily.     Yes Historical Provider, MD  fenofibrate 160 MG tablet Take 160 mg by mouth daily.     Yes Historical Provider, MD  fish oil-omega-3 fatty acids 1000 MG capsule Take 1 g by mouth daily.   Yes Historical Provider, MD  furosemide (LASIX) 20 MG tablet Take 20 mg by mouth daily.    Yes Historical Provider, MD  glimepiride (AMARYL) 4 MG tablet Take 2 mg by mouth daily before breakfast. Take 1/2 tab daily before bkfst.   Yes Historical Provider, MD  levothyroxine (SYNTHROID, LEVOTHROID) 50 MCG tablet Take 50 mcg by mouth daily.     Yes Historical Provider, MD  metoprolol (LOPRESSOR) 50 MG tablet Take 25 mg by mouth daily. Take 1/2 tab daily   Yes Historical Provider, MD  Multiple Vitamin (MULTIVITAMIN) tablet Take 1 tablet by mouth daily.     Yes Historical Provider, MD  pravastatin (PRAVACHOL) 20 MG tablet Take 20 mg by mouth daily.     Yes Historical Provider, MD  warfarin (COUMADIN) 3 MG tablet Take 1.5 mg by mouth daily.    Yes Historical Provider, MD    Allergies:  No Known Allergies  Social History:  reports that he has been smoking Cigarettes.  He has a 50 pack-year smoking history. He does not have any smokeless tobacco history on file. He reports that he does not drink alcohol or use illicit drugs. Social History (reviewed - no changes required): Single Lives at home alone, his Son lives nearby to assist him with care Mr. Ranta is on disability Patient smokes 2-3 ppd, and has for 50 years No H/O alcohol use  Family History:  Family History (reviewed - no changes required): Father:D-72 Esophageal varices Mother:90  Physical Exam: Filed Vitals:   02/21/13 1515 02/21/13 1530 02/21/13 1545 02/21/13 1615  BP: 116/35 119/47 122/28 113/39  Pulse: 70 72 85 55  Temp:      TempSrc:      Resp: 15 16 16 16   SpO2: 96% 95% 96% 95%        Labs on Admission:    Recent Labs  02/21/13 0953  NA 134*  K 5.0  CL 95*  CO2 24  GLUCOSE 535*  BUN 37*  CREATININE 1.85*  CALCIUM 9.0    Recent Labs  02/21/13 0953  AST  16  ALT 18  ALKPHOS 77  BILITOT 1.1  PROT 6.8  ALBUMIN 3.3*    Recent Labs  02/21/13 0953  LIPASE 15    Recent Labs  02/21/13 0953  WBC 20.6*  NEUTROABS 18.9*  HGB 16.4  HCT 47.1  MCV 82.8  PLT 156    Lab Results  Component Value Date   INR 1.05 02/21/2013   INR 1.23 07/01/2011   INR 1.30 06/25/2011       Radiological Exams on Admission: Ct Head Wo Contrast  02/21/2013  *RADIOLOGY REPORT*  Clinical Data: Confusion and weakness.  CT HEAD WITHOUT CONTRAST  Technique:  Contiguous axial images were obtained from the base of the skull through the vertex without contrast.  Comparison: None.  Findings: There is encephalomalacia in the left temporal lobe and inferior left frontal lobe.  There is diffuse cerebral atrophy. There is low density in the periventricular and subcortical white matter suggesting chronic changes.  No evidence for acute hemorrhage, midline shift or hydrocephalus.  Mild mucosal disease in the left ethmoid air cells.  No acute bony abnormality.  IMPRESSION: Negative for acute hemorrhage.  Areas of encephalomalacia in the left cerebrum are consistent with previous infarcts.  It would be difficult to exclude an acute infarct.  If there is concern for an acute infarct, recommend further evaluation with MRI.  Atrophy and evidence of chronic small vessel ischemic changes.   Original Report Authenticated By: Richarda Overlie, M.D.    Dg Abd Acute W/chest  02/21/2013  *RADIOLOGY REPORT*  Clinical Data: 67 year old male with nausea and vomiting.  ACUTE ABDOMEN SERIES (ABDOMEN 2 VIEW & CHEST 1 VIEW)  Comparison: Chest radiographs 06/23/2011.  CT 01/05/2008.  Findings: Mildly lower lung volumes.  Stable cardiac size and mediastinal contours.  Stable sequelae of CABG.  Stable increased interstitial markings are vascular  congestion, no overt pulmonary edema.  No definite pleural effusion.  No pneumothorax or pneumoperitoneum.  Widespread severe calcified atherosclerosis in the abdomen and pelvis.  Much of the SMA is calcified.  Right upper quadrant small gallstones re-identified. Nonobstructed bowel gas pattern. No acute osseous abnormality identified.  IMPRESSION: 1. Nonobstructed bowel gas pattern, no free air. 2.  Lower lung volumes, no acute cardiopulmonary abnormality. 3.  Chronic severe calcified atherosclerosis.   Original Report Authenticated By: Erskine Speed, M.D.    Orders placed during the hospital encounter of 02/21/13  . EKG 12-LEAD: ATRIAL FIBRILLATION ~ ? Atrial activity MULTIPLE PREMATURE COMPLEXES, VENT & SUPRAVEN ~ V and SV complexes w/ short R-R RIGHT BUNDLE BRANCH BLOCK ~ QRSd>120, terminal axis(90,270) PROBABLE INFERIOR INFARCT, OLD ~ Q>64mS, abnormal ST-T, II III aVF              Assessment/Plan 1. NAUSEA/VOMITING WEAKNESS/HYPOTENSION 2. AFIB WITH RVR 3. RIGHT SIDED WEAKNESS 4. DM 2 WITH HIGH SUGARS 5. CRF/ARF 6. PVD WITH AMPUTATIONS 7. CAD 8. HYPOTHYROID 9. HYPERLIPIDEMIA: TG were over 1000 at last check. Check pancreatic enzymes 10.COPD: still smoking  [name] [name] 02/21/2013, 4:55 PM

## 2013-02-21 NOTE — ED Notes (Signed)
[phone]. [facility]

## 2013-02-21 NOTE — ED Provider Notes (Signed)
History     CSN: [id number]  Arrival date & time 02/21/13  0919   First MD Initiated Contact with Patient 02/21/13 [phone]      Chief Complaint  Patient presents with  . Nausea  . Emesis    (Consider location/radiation/quality/duration/timing/severity/associated sxs/prior treatment) HPI Comments: Patient presents to the ED for nausea, vomiting, and diarrhea x 3 days.  Denies any blood in his stool or vomit.  Pts son is at beside and notes a decreased appetite with a decline in AMS over the past few days.  Son states he has been very weak, lethargic, and not acting like his normal self- notably last night he was confused at dinner and could not recognize a taco.  Pt has an extensive medical hx including DM, CAD, PAD, Afib, Hypothyroid, HLP, HTN, CKD, COPD.  No dx of dementia or alzheimers.  Pt has had several strokes, MIs, and approx 25 different operations including bilateral AKAs, CABG, femoral bypass graft bilaterally, etc.  Pt has hx of GI bleed.  Currently on Coumadin 3mg  daily.  The history is provided by the patient.    Past Medical History  Diagnosis Date  . Diabetes mellitus   . Anemia   . CAD (coronary artery disease)   . History of GI bleed   . PAD (peripheral artery disease)   . Atrial fibrillation   . Hypothyroidism   . Hyperlipidemia   . Hypertension   . History of hiatal hernia   . Chronic kidney disease     chronic renal insufficiency  . COPD (chronic obstructive pulmonary disease)   . Dehiscence of operative wound     Past Surgical History  Procedure Laterality Date  . Femoral bypass  2004    Right w/ Goretex   by Dr. Josephina Gip  . Femoral bypass  2003    Left leg,   I& D of Lymphocele  . Coronary artery bypass graft  2002    By Tyrone Sage  . 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 Leg amputation above knee  04/11/2010    Revision of right above knee amputation    No family history on  file.  History  Substance Use Topics  . Smoking status: Current Every Day Smoker -- 1.00 packs/day for 50 years    Types: Cigarettes  . Smokeless tobacco: Not on file  . Alcohol Use: No      Review of Systems  Constitutional: Positive for appetite change.  Gastrointestinal: Positive for nausea, vomiting, abdominal pain and diarrhea.  Neurological: Positive for weakness.  All other systems reviewed and are negative.    Allergies  Review of patient's allergies indicates no known allergies.  Home Medications   Current Outpatient Rx  Name  Route  Sig  Dispense  Refill  . docusate sodium (COLACE) 100 MG capsule   Oral   Take 100 mg by mouth daily.           . Multiple Vitamin (MULTIVITAMIN) tablet   Oral   Take 1 tablet by mouth daily.           . benazepril-hydrochlorthiazide (LOTENSIN HCT) 10-12.5 MG per tablet   Oral   Take 1 tablet by mouth daily.           . digoxin (LANOXIN) 0.125 MG tablet   Oral   Take 125 mcg by mouth daily.           [name]  doxycycline (VIBRAMYCIN) 100 MG capsule   Oral   Take 100 mg by mouth 2 (two) times daily.           . fenofibrate 160 MG tablet   Oral   Take 160 mg by mouth daily.           . furosemide (LASIX) 20 MG tablet   Oral   Take 20 mg by mouth daily.          [name] glimepiride (AMARYL) 4 MG tablet   Oral   Take 2 mg by mouth. Take 1/2 tab daily before bkfst.         . levothyroxine (SYNTHROID, LEVOTHROID) 50 MCG tablet   Oral   Take 50 mcg by mouth daily.           . metoprolol (LOPRESSOR) 50 MG tablet   Oral   Take 25 mg by mouth. Take 1/2 tab daily         . pravastatin (PRAVACHOL) 20 MG tablet   Oral   Take 20 mg by mouth daily.           . rosuvastatin (CRESTOR) 20 MG tablet   Oral   Take 20 mg by mouth. Take 2 times per week          . warfarin (COUMADIN) 3 MG tablet   Oral   Take 3 mg by mouth daily.             BP 125/92  Pulse 142  Temp(Src) 97.9 F (36.6 C) (Oral)  Resp  16  SpO2 94%  Physical Exam  Nursing note and vitals reviewed. Constitutional: He is oriented to person, place, and time. He appears well-developed and well-nourished.  HENT:  Head: Normocephalic and atraumatic.  Mouth/Throat: Oropharynx is clear and moist.  Eyes: Conjunctivae and EOM are normal. Pupils are equal, round, and reactive to light.  Neck: Normal range of motion. Neck supple.  Cardiovascular: Normal heart sounds.  An irregularly irregular rhythm present. Tachycardia present.   Pulmonary/Chest: Effort normal and breath sounds normal.  Abdominal: Soft. Bowel sounds are normal. There is no tenderness. There is no guarding.  Musculoskeletal:  Bilateral AKAs  Neurological: He is alert and oriented to person, place, and time. No cranial nerve deficit or sensory deficit.  UE weakness R > L, sensation intact, no facial droop appreciated, CN grossly intact  Skin: Skin is warm and dry.  Psychiatric: He has a normal mood and affect. His speech is normal.    ED Course  Procedures (including critical care time)   Date: 02/21/2013  Rate: 127  Rhythm: atrial fibrillation  QRS Axis: normal  Intervals:   ST/T Wave abnormalities: indeterminate  Conduction Disutrbances:rvr  Narrative Interpretation: afib w/RVR  Old EKG Reviewed: changes noted    Labs Reviewed  CBC WITH DIFFERENTIAL - Abnormal; Notable for the following:    WBC 20.6 (*)    Neutrophils Relative 92 (*)    Neutro Abs 18.9 (*)    Lymphocytes Relative 3 (*)    Lymphs Abs 0.6 (*)    Monocytes Absolute 1.1 (*)    All other components within normal limits  COMPREHENSIVE METABOLIC PANEL - Abnormal; Notable for the following:    Sodium 134 (*)    Chloride 95 (*)    Glucose, Bld 535 (*)    BUN 37 (*)    Creatinine, Ser 1.85 (*)    Albumin 3.3 (*)    GFR calc non Af Amer 36 (*)  GFR calc Af Amer 42 (*)    All other components within normal limits  URINALYSIS, ROUTINE W REFLEX MICROSCOPIC - Abnormal; Notable for  the following:    Glucose, UA >1000 (*)    Bilirubin Urine SMALL (*)    Ketones, ur 15 (*)    Protein, ur >300 (*)    All other components within normal limits  URINE MICROSCOPIC-ADD ON - Abnormal; Notable for the following:    Casts HYALINE CASTS (*)    All other components within normal limits  DIGOXIN LEVEL - Abnormal; Notable for the following:    Digoxin Level 0.3 (*)    All other components within normal limits  LIPASE, BLOOD  PROTIME-INR  APTT  OCCULT BLOOD, POC DEVICE   Ct Head Wo Contrast  02/21/2013  *RADIOLOGY REPORT*  Clinical Data: Confusion and weakness.  CT HEAD WITHOUT CONTRAST  Technique:  Contiguous axial images were obtained from the base of the skull through the vertex without contrast.  Comparison: None.  Findings: There is encephalomalacia in the left temporal lobe and inferior left frontal lobe.  There is diffuse cerebral atrophy. There is low density in the periventricular and subcortical white matter suggesting chronic changes.  No evidence for acute hemorrhage, midline shift or hydrocephalus.  Mild mucosal disease in the left ethmoid air cells.  No acute bony abnormality.  IMPRESSION: Negative for acute hemorrhage.  Areas of encephalomalacia in the left cerebrum are consistent with previous infarcts.  It would be difficult to exclude an acute infarct.  If there is concern for an acute infarct, recommend further evaluation with MRI.  Atrophy and evidence of chronic small vessel ischemic changes.   Original Report Authenticated By: Richarda Overlie, M.D.    Dg Abd Acute W/chest  02/21/2013  *RADIOLOGY REPORT*  Clinical Data: 67 year old male with nausea and vomiting.  ACUTE ABDOMEN SERIES (ABDOMEN 2 VIEW & CHEST 1 VIEW)  Comparison: Chest radiographs 06/23/2011.  CT 01/05/2008.  Findings: Mildly lower lung volumes.  Stable cardiac size and mediastinal contours.  Stable sequelae of CABG.  Stable increased interstitial markings are vascular congestion, no overt pulmonary edema.   No definite pleural effusion.  No pneumothorax or pneumoperitoneum.  Widespread severe calcified atherosclerosis in the abdomen and pelvis.  Much of the SMA is calcified.  Right upper quadrant small gallstones re-identified. Nonobstructed bowel gas pattern. No acute osseous abnormality identified.  IMPRESSION: 1. Nonobstructed bowel gas pattern, no free air. 2.  Lower lung volumes, no acute cardiopulmonary abnormality. 3.  Chronic severe calcified atherosclerosis.   Original Report Authenticated By: Erskine Speed, M.D.    CRITICAL CARE Performed by: Garlon Hatchet   Total critical care time: 30  Critical care time was exclusive of separately billable procedures and treating other patients.  Critical care was necessary to treat or prevent imminent or life-threatening deterioration.  Critical care was time spent personally by me on the following activities: development of treatment plan with patient and/or surrogate as well as nursing, discussions with consultants, evaluation of patient's response to treatment, examination of patient, obtaining history from patient or surrogate, ordering and performing treatments and interventions, ordering and review of laboratory studies, ordering and review of radiographic studies, pulse oximetry and re-evaluation of patient's condition.  Medications  diltiazem (CARDIZEM) 100 mg in dextrose 5 % 100 mL infusion (5 mg/hr Intravenous New Bag/Given 02/21/13 1315)  digoxin (LANOXIN) tablet 125 mcg (0 mcg Oral Duplicate 02/22/13 1021)  docusate sodium (COLACE) capsule 100 mg (100 mg Oral Not Given  02/22/13 1021)  fenofibrate tablet 160 mg (160 mg Oral Given 02/22/13 1021)  furosemide (LASIX) tablet 20 mg (20 mg Oral Given 02/22/13 1021)  levothyroxine (SYNTHROID, LEVOTHROID) tablet 50 mcg (0 mcg Oral Duplicate 02/22/13 0804)  metoprolol tartrate (LOPRESSOR) tablet 12.5 mg (12.5 mg Oral Given 02/22/13 1021)  simvastatin (ZOCOR) tablet 10 mg (10 mg Oral Given 02/21/13 2245)   insulin aspart (novoLOG) injection 0-15 Units (5 Units Subcutaneous Given 02/22/13 0839)  insulin aspart (novoLOG) injection 0-5 Units (5 Units Subcutaneous Given 02/21/13 2133)  Warfarin - Pharmacist Dosing Inpatient (not administered)  sodium chloride 0.9 % injection 3 mL (3 mLs Intravenous Not Given 02/22/13 1022)  0.9 %  sodium chloride infusion ( Intravenous New Bag/Given 02/21/13 2246)  acetaminophen (TYLENOL) tablet 650 mg (not administered)    Or  acetaminophen (TYLENOL) suppository 650 mg (not administered)  ondansetron (ZOFRAN) tablet 4 mg (not administered)    Or  ondansetron (ZOFRAN) injection 4 mg (not administered)  pantoprazole (PROTONIX) EC tablet 40 mg (40 mg Oral Given 02/22/13 1025)  levothyroxine (SYNTHROID, LEVOTHROID) tablet 50 mcg (50 mcg Oral Given 02/22/13 0803)  digoxin (LANOXIN) tablet 0.125 mg (0.125 mg Oral Given 02/22/13 1021)  atorvastatin (LIPITOR) tablet 80 mg (not administered)  0.9 %  sodium chloride infusion ( Intravenous Not Given 02/21/13 2246)  heparin ADULT infusion 100 units/mL (25000 units/250 mL) (1,200 Units/hr Intravenous Rate/Dose Change 02/22/13 0647)  multivitamin with minerals tablet 1 tablet (1 tablet Oral Given 02/22/13 1020)  omega-3 acid ethyl esters (LOVAZA) capsule 2 g (2 g Oral Given 02/22/13 1020)  warfarin (COUMADIN) tablet 7.5 mg (not administered)  insulin glargine (LANTUS) injection 18 Units (not administered)  potassium chloride SA (K-DUR,KLOR-CON) CR tablet 20 mEq (20 mEq Oral Given 02/22/13 1140)  ondansetron (ZOFRAN) injection 4 mg (4 mg Intravenous Given 02/21/13 1017)  sodium chloride 0.9 % bolus 1,000 mL (0 mLs Intravenous Stopped 02/21/13 1408)  metoCLOPramide (REGLAN) injection 10 mg (10 mg Intravenous Given 02/21/13 1239)  LORazepam (ATIVAN) injection 0.5 mg (0.5 mg Intravenous Given 02/21/13 1632)  0.9 %  sodium chloride infusion ( Intravenous Stopped 02/21/13 1714)  insulin aspart (novoLOG) injection 10 Units (10 Units Intravenous  Given 02/21/13 1629)  warfarin (COUMADIN) tablet 7.5 mg (7.5 mg Oral Given 02/21/13 2244)  insulin glargine (LANTUS) injection 10 Units (10 Units Subcutaneous Given 02/22/13 0002)  insulin aspart (novoLOG) injection 5 Units (5 Units Subcutaneous Given 02/21/13 2247)  heparin bolus via infusion 2,000 Units (2,000 Units Intravenous Given 02/22/13 0647)      1. Nausea & vomiting   2. Confusion       MDM   Pt with extensive PMH presenting to the ED with N/V/D x 3 days.  Son notes AMS and decline from baseline over the past few days.  Work-up for GI bleed, stroke, MI- all of which pt has had in the past.  Pt AAOx3, NAD, EKG Afib w/RVR.  Low dose cardizem drip infusing.  Labs as above- Coumadin and digoxin not therapeutic, 1.05 and 0.3 respectively, Leukocytosis 20 without known source.  VS beginning to stabilize, HR 95-100.  CT head inconclusive for acute stroke- MRI pending.  CBG 459, 10 units IV insulin given.  Contacted Dr. Evlyn Kanner, will continue to manage in the ED for now, instructed to contact him again once MRI is complete- pt will likely be admitted for further treatement.  Signed out to Felicie Morn at shift change for continuation of care and disposition when appropriate.  Garlon Hatchet, PA-C 02/22/13 1143

## 2013-02-21 NOTE — ED Notes (Signed)
CBG 459 

## 2013-02-21 NOTE — ED Notes (Signed)
Pt is currently in MRI

## 2013-02-21 NOTE — H&P (Addendum)
[name] is an 67 y.o. male.   Chief Complaint: n/v/diarrhea HPI: [name] is a 67 yo male with pmhx as listed below.  He was in his usoh until this a.m.  . He awoke then developed n/v times 3-4 episodes. He also had some abdominal pain.  He also developed several episodes of watery diarrhea.  He felt very poorly and called 911.  He denies any fever. He denies any blood from above or below. He has been given IV fluids in the ER and he currently feels somewhat improved.  There was a question of weakness and an mri of the brain has been done and the result is pending. He will be admitted for further care.  Past Medical History  Diagnosis Date  . Diabetes mellitus-uncontrolled   . Anemia   . CAD (coronary artery disease)   . History of GI bleed   . PAD (peripheral artery disease)   . Atrial fibrillation   . Hypothyroidism   . Hyperlipidemia   . Hypertension   . History of hiatal hernia   . Chronic kidney disease     chronic renal insufficiency  . COPD (chronic obstructive pulmonary disease)   . 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 Femoral bypass  2004    Right w/ Goretex   by Dr. Josephina Gip  . Femoral bypass  2003    Left leg,   I& D of Lymphocele  . Coronary artery bypass graft  2002    By Tyrone Sage  . 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 Leg amputation above knee  04/11/2010    Revision of right above knee amputation    No family history on file. Social History:  reports that he has been smoking Cigarettes.  He has a 50 pack-year smoking history. He does not have any smokeless tobacco history on file. He reports that he does not drink alcohol or use illicit drugs.  Allergies: No Known Allergies   (Not in a hospital admission)  Results for orders placed during the hospital  encounter of 02/21/13 (from the past 48 hour(s))  CBC WITH DIFFERENTIAL     Status: Abnormal   Collection Time    02/21/13  9:53 AM      Result Value Range   WBC 20.6 (*) 4.0 - 10.5 K/uL   RBC 5.69  4.22 - 5.81 MIL/uL   Hemoglobin 16.4  13.0 - 17.0 g/dL   HCT 21.3  08.6 - 57.8 %   MCV 82.8  78.0 - 100.0 fL   MCH 28.8  26.0 - 34.0 pg   MCHC 34.8  30.0 - 36.0 g/dL   RDW 46.9  62.9 - 52.8 %   Platelets 156  150 - 400 K/uL   Neutrophils Relative 92 (*) 43 - 77 %   Neutro Abs 18.9 (*) 1.7 - 7.7 K/uL   Lymphocytes Relative 3 (*) 12 - 46 %   Lymphs Abs 0.6 (*) 0.7 - 4.0 K/uL   Monocytes Relative 5  3 - 12 %   Monocytes Absolute 1.1 (*) 0.1 - 1.0 K/uL   Eosinophils Relative 0  0 - 5 %   Eosinophils Absolute 0.0  0.0 - 0.7 K/uL   Basophils Relative 0  0 -  1 %   Basophils Absolute 0.0  0.0 - 0.1 K/uL  COMPREHENSIVE METABOLIC PANEL     Status: Abnormal   Collection Time    02/21/13  9:53 AM      Result Value Range   Sodium 134 (*) 135 - 145 mEq/L   Potassium 5.0  3.5 - 5.1 mEq/L   Chloride 95 (*) 96 - 112 mEq/L   CO2 24  19 - 32 mEq/L   Glucose, Bld 535 (*) 70 - 99 mg/dL   BUN 37 (*) 6 - 23 mg/dL   Creatinine, Ser 1.61 (*) 0.50 - 1.35 mg/dL   Calcium 9.0  8.4 - 09.6 mg/dL   Total Protein 6.8  6.0 - 8.3 g/dL   Albumin 3.3 (*) 3.5 - 5.2 g/dL   AST 16  0 - 37 U/L   ALT 18  0 - 53 U/L   Alkaline Phosphatase 77  39 - 117 U/L   Total Bilirubin 1.1  0.3 - 1.2 mg/dL   GFR calc non Af Amer 36 (*) >90 mL/min   GFR calc Af Amer 42 (*) >90 mL/min   Comment:            The eGFR has been calculated     using the CKD EPI equation.     This calculation has not been     validated in all clinical     situations.     eGFR's persistently     <90 mL/min signify     possible Chronic Kidney Disease.  LIPASE, BLOOD     Status: None   Collection Time    02/21/13  9:53 AM      Result Value Range   Lipase 15  11 - 59 U/L  PROTIME-INR     Status: None   Collection Time    02/21/13  9:53 AM       Result Value Range   Prothrombin Time 13.6  11.6 - 15.2 seconds   INR 1.05  0.00 - 1.49  APTT     Status: None   Collection Time    02/21/13  9:53 AM      Result Value Range   aPTT 31  24 - 37 seconds  OCCULT BLOOD, POC DEVICE     Status: None   Collection Time    02/21/13 11:27 AM      Result Value Range   Fecal Occult Bld NEGATIVE  NEGATIVE  URINALYSIS, ROUTINE W REFLEX MICROSCOPIC     Status: Abnormal   Collection Time    02/21/13 11:47 AM      Result Value Range   Color, Urine YELLOW  YELLOW   APPearance CLEAR  CLEAR   Specific Gravity, Urine 1.025  1.005 - 1.030   pH 5.0  5.0 - 8.0   Glucose, UA >1000 (*) NEGATIVE mg/dL   Hgb urine dipstick NEGATIVE  NEGATIVE   Bilirubin Urine SMALL (*) NEGATIVE   Ketones, ur 15 (*) NEGATIVE mg/dL   Protein, ur >045 (*) NEGATIVE mg/dL   Urobilinogen, UA 1.0  0.0 - 1.0 mg/dL   Nitrite NEGATIVE  NEGATIVE   Leukocytes, UA NEGATIVE  NEGATIVE  URINE MICROSCOPIC-ADD ON     Status: Abnormal   Collection Time    02/21/13 11:47 AM      Result Value Range   WBC, UA 0-2  <3 WBC/hpf   RBC / HPF 0-2  <3 RBC/hpf   Bacteria, UA RARE  RARE   Casts HYALINE CASTS (*)  NEGATIVE  DIGOXIN LEVEL     Status: Abnormal   Collection Time    02/21/13  1:35 PM      Result Value Range   Digoxin Level 0.3 (*) 0.8 - 2.0 ng/mL  GLUCOSE, CAPILLARY     Status: Abnormal   Collection Time    02/21/13  4:09 PM      Result Value Range   Glucose-Capillary 459 (*) 70 - 99 mg/dL   Comment 1 Documented in Chart    GLUCOSE, CAPILLARY     Status: Abnormal   Collection Time    02/21/13  6:23 PM      Result Value Range   Glucose-Capillary 400 (*) 70 - 99 mg/dL   Dg Chest 2 View  1/61/0960  *RADIOLOGY REPORT*  Clinical Data: Weakness, nausea and vomiting.  Hypertension and diabetes.  Smoking history.  Previous CABG.  CHEST - 2 VIEW  Comparison: Earlier same day  Findings: There has been previous median sternotomy and CABG. Cardiomegaly.  There is venous hypertension  probably with early interstitial edema.  There is a tiny amount of fluid in the posterior costophrenic angles.  No significant bony finding.  IMPRESSION: Suspicion of developing congestive heart failure. Cardiomegaly with pulmonary venous hypertension and developing edema.   Original Report Authenticated By: Paulina Fusi, M.D.    Ct Head Wo Contrast  02/21/2013  *RADIOLOGY REPORT*  Clinical Data: Confusion and weakness.  CT HEAD WITHOUT CONTRAST  Technique:  Contiguous axial images were obtained from the base of the skull through the vertex without contrast.  Comparison: None.  Findings: There is encephalomalacia in the left temporal lobe and inferior left frontal lobe.  There is diffuse cerebral atrophy. There is low density in the periventricular and subcortical white matter suggesting chronic changes.  No evidence for acute hemorrhage, midline shift or hydrocephalus.  Mild mucosal disease in the left ethmoid air cells.  No acute bony abnormality.  IMPRESSION: Negative for acute hemorrhage.  Areas of encephalomalacia in the left cerebrum are consistent with previous infarcts.  It would be difficult to exclude an acute infarct.  If there is concern for an acute infarct, recommend further evaluation with MRI.  Atrophy and evidence of chronic small vessel ischemic changes.   Original Report Authenticated By: Richarda Overlie, M.D.    Mr Brain Wo Contrast  02/21/2013  *RADIOLOGY REPORT*  Clinical Data: Confusion with dizziness and vomiting.  Rule out infarct.  MRI HEAD WITHOUT CONTRAST  Technique:  Multiplanar, multiecho pulse sequences of the brain and surrounding structures were obtained according to standard protocol without intravenous contrast.  Comparison: 02/21/2013 CT.  No comparison brain MR.  Findings: Motion degraded exam.  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  At the anterior aspect of this remote infarct, there is suggestion of small area of  restricted motion (series 5 image 12 and series 500 image 12) which raises possibility of small acute infarct.  Very small acute infarct posterior right corona radiata (series 5 images 21 and 22).  Remote left frontal lobe infarct with moderate area of encephalomalacia.  Remote small left posterior lateral thalamic infarct.  Remote pontine infarct.  Remote tiny cerebellar infarcts bilaterally.  Prominent small vessel disease type changes.  Global atrophy.  Ventricular prominence felt to be related to atrophy rather than hydrocephalus.  No intracranial mass lesion detected on this unenhanced exam.  Major intracranial vascular structures are patent.  No hemorrhage separate from the laminar necrosis.  IMPRESSION: Exam is  motion degraded.  Several remote infarcts largest involving the left temporal lobe. Along the anterior aspect of this remote left temporal lobe infarct, possibility of small acute infarct is raised. Additionally a very small acute infarct of the posterior right corona radiata may be present.  Please see above for further detail   Original Report Authenticated By: Lacy Duverney, M.D.    Dg Abd Acute W/chest  02/21/2013  *RADIOLOGY REPORT*  Clinical Data: 67 year old male with nausea and vomiting.  ACUTE ABDOMEN SERIES (ABDOMEN 2 VIEW & CHEST 1 VIEW)  Comparison: Chest radiographs 06/23/2011.  CT 01/05/2008.  Findings: Mildly lower lung volumes.  Stable cardiac size and mediastinal contours.  Stable sequelae of CABG.  Stable increased interstitial markings are vascular congestion, no overt pulmonary edema.  No definite pleural effusion.  No pneumothorax or pneumoperitoneum.  Widespread severe calcified atherosclerosis in the abdomen and pelvis.  Much of the SMA is calcified.  Right upper quadrant small gallstones re-identified. Nonobstructed bowel gas pattern. No acute osseous abnormality identified.  IMPRESSION: 1. Nonobstructed bowel gas pattern, no free air. 2.  Lower lung volumes, no acute  cardiopulmonary abnormality. 3.  Chronic severe calcified atherosclerosis.   Original Report Authenticated By: Erskine Speed, M.D.     ROS:as per hpi  Home meds include: Fish oil  4 daily amaryl  4 mg 1/2 tab daily prilosec 20 mg daily Humulin u500 11 units before breakfast and supper Lasix 40 mg daily Fenofibrate 160 mg daily Synthroid 50 mcg daily mvi daily Metoprolol 50 mg bid lotensin /hct 10-12.5 daily Digoxin 0.125 mg daily Coumadin 3 mg  1/2 tab 5 days/wk,  One tab two days /week lipitor 80 mg daily   Blood pressure 96/40, pulse 92, temperature 97.9 F (36.6 C), temperature source Oral, resp. rate 17, SpO2 95.00%.  age appropriate male. lying semi-supine.  he is alert and oriented times four.   nad,  He moves both UE with symmetric strength, speech is normal.  Heart is irregularly irregular with no sig, m/r/g.  Lungs are CTA bilat. With no w/r/r. Abd is soft, nt, nd, no mass or hsm. Bilateral le amputations are noted.  Assessment/Plan Nausea and vomiting and diarrhea associated with Afib with rvr, initial hypotension, leukocytosis and unilateral weakness.  MRI brain raises the possibility of a couple small new acute infarcts.  He is subtherapeutic on his inr and he will be treated with heparin gtt and his coumadin will be restarted.  This may have all started with a  Gastroenteritis.  No definite evidence of pancreatitis currently although he is at risk for this given his severe hypertriglyceridemia that has been known recently. Admit, hydrate, anticoagulate.  High WBC is noted but no fever and no source of infection so no abx to be started now. Continue his other home medications to the best of our ability.  I have been asked to clarify is ckd status.   Mr. 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 He also has significant microabluminuria which has been documented at our office in our lab in the past as  well. So, given his Cr today he has some degree of Acute renal failure superimposed on ckd 2.   Ezequiel Kayser, MD 02/21/2013, 7:01 PM

## 2013-02-22 ENCOUNTER — Encounter (HOSPITAL_COMMUNITY): Payer: Self-pay

## 2013-02-22 LAB — CBC WITH DIFFERENTIAL/PLATELET
Basophils Relative: 0 % (ref 0–1)
Eosinophils Absolute: 0 10*3/uL (ref 0.0–0.7)
HCT: 37.5 % — ABNORMAL LOW (ref 39.0–52.0)
Hemoglobin: 13.2 g/dL (ref 13.0–17.0)
Lymphs Abs: 1.7 10*3/uL (ref 0.7–4.0)
MCH: 28.9 pg (ref 26.0–34.0)
MCHC: 35.2 g/dL (ref 30.0–36.0)
Monocytes Absolute: 0.9 10*3/uL (ref 0.1–1.0)
Monocytes Relative: 8 % (ref 3–12)
Neutro Abs: 8.1 10*3/uL — ABNORMAL HIGH (ref 1.7–7.7)
RBC: 4.56 MIL/uL (ref 4.22–5.81)

## 2013-02-22 LAB — HEMOGLOBIN A1C: Mean Plasma Glucose: 286 mg/dL — ABNORMAL HIGH (ref <117)

## 2013-02-22 LAB — GLUCOSE, CAPILLARY
Glucose-Capillary: 230 mg/dL — ABNORMAL HIGH (ref 70–99)
Glucose-Capillary: 184 mg/dL — ABNORMAL HIGH (ref 70–99)

## 2013-02-22 LAB — COMPREHENSIVE METABOLIC PANEL
ALT: 19 U/L (ref 0–53)
AST: 49 U/L — ABNORMAL HIGH (ref 0–37)
Calcium: 7.9 mg/dL — ABNORMAL LOW (ref 8.4–10.5)
Creatinine, Ser: 1.74 mg/dL — ABNORMAL HIGH (ref 0.50–1.35)
Sodium: 142 mEq/L (ref 135–145)
Total Protein: 5.7 g/dL — ABNORMAL LOW (ref 6.0–8.3)

## 2013-02-22 LAB — PROTIME-INR: Prothrombin Time: 13.5 seconds (ref 11.6–15.2)

## 2013-02-22 LAB — HEPARIN LEVEL (UNFRACTIONATED)
Heparin Unfractionated: 0.14 IU/mL — ABNORMAL LOW (ref 0.30–0.70)
Heparin Unfractionated: 0.4 IU/mL (ref 0.30–0.70)

## 2013-02-22 MED ORDER — HEPARIN BOLUS VIA INFUSION
2000.0000 [IU] | Freq: Once | INTRAVENOUS | Status: AC
  Administered 2013-02-22: 2000 [IU] via INTRAVENOUS
  Filled 2013-02-22: qty 2000

## 2013-02-22 MED ORDER — INSULIN GLARGINE 100 UNIT/ML ~~LOC~~ SOLN
18.0000 [IU] | Freq: Every day | SUBCUTANEOUS | Status: DC
  Administered 2013-02-22 – 2013-02-23 (×2): 18 [IU] via SUBCUTANEOUS
  Filled 2013-02-22 (×3): qty 0.18

## 2013-02-22 MED ORDER — WARFARIN SODIUM 7.5 MG PO TABS
7.5000 mg | ORAL_TABLET | Freq: Once | ORAL | Status: AC
  Administered 2013-02-22: 7.5 mg via ORAL
  Filled 2013-02-22: qty 1

## 2013-02-22 MED ORDER — POTASSIUM CHLORIDE CRYS ER 20 MEQ PO TBCR
20.0000 meq | EXTENDED_RELEASE_TABLET | Freq: Two times a day (BID) | ORAL | Status: DC
  Administered 2013-02-22 – 2013-02-24 (×5): 20 meq via ORAL
  Filled 2013-02-22 (×8): qty 1

## 2013-02-22 NOTE — Progress Notes (Signed)
Dr Rinaldo Cloud office notified and message left with his office nurse Melissa: 1) pt's son Orvilla Fus would like for Dr Evlyn Kanner to call him with pt's  "MRI results and update to what is going on with my father", to call at # [phone].     2) pt had 12 beats of Vtach earlier today, pt was asymptomatic, pt's baseline rhythm is afib.

## 2013-02-22 NOTE — Progress Notes (Signed)
ANTICOAGULATION CONSULT NOTE - Follow Up Consult  Pharmacy Consult for heparin Indication: atrial fibrillation  Labs:  Recent Labs  02/21/13 0953 02/22/13 0540  HGB 16.4  --   HCT 47.1  --   PLT 156  --   APTT 31  --   LABPROT 13.6 13.5  INR 1.05 1.04  HEPARINUNFRC  --  0.14*  CREATININE 1.85*  --     Assessment: 66yo male subtherapeutic on heparin with initial dosing for Afib.  Goal of Therapy:  Heparin level 0.3-0.7 units/ml   Plan:  Will bolus with heparin 2000 units x1 and increase gtt by 3 units/kg/hr to 1200 units/hr and check level in 8hr.  Vernard Gambles, PharmD, BCPS  02/22/2013,6:45 AM

## 2013-02-22 NOTE — Progress Notes (Signed)
Subjective: Had a good night. 2 normal BM"s. Denies CP or SOB. No chills or sweats. Breakfast is untouched at the bedside   Objective: Vital signs in last 24 hours: Temp:  [98 F (36.7 C)-98.5 F (36.9 C)] 98 F (36.7 C) (04/29 0834) Pulse Rate:  [55-122] 88 (04/29 0834) Resp:  [15-22] 19 (04/29 0834) BP: (91-122)/(24-83) 114/40 mmHg (04/29 0834) SpO2:  [90 %-97 %] 91 % (04/29 1026) Weight:  [86.4 kg (190 lb 7.6 oz)] 86.4 kg (190 lb 7.6 oz) (04/28 2100)  Intake/Output from previous day: 04/28 0701 - 04/29 0700 In: 455 [I.V.:455] Out: 200 [Urine:200] Intake/Output this shift:    Sitting up in no distress. Minor facial assymetry, oral membranes moist. Lungs fairly clear, distant. Ht IR/IR distant. abd nondistended. Extrems: healed bilat amputations. Awake, alert, clear speech  Lab Results   Recent Labs  02/21/13 0953 02/22/13 0540  WBC 20.6* 10.8*  RBC 5.69 4.56  HGB 16.4 13.2  HCT 47.1 37.5*  MCV 82.8 82.2  MCH 28.8 28.9  RDW 14.4 14.9  PLT 156 122*    Recent Labs  02/21/13 0953 02/22/13 0540  NA 134* 142  K 5.0 3.5  CL 95* 108  CO2 24 23  GLUCOSE 535* 243*  BUN 37* 41*  CREATININE 1.85* 1.74*  CALCIUM 9.0 7.9*    Studies/Results: Dg Chest 2 View  02/21/2013  *RADIOLOGY REPORT*  Clinical Data: Weakness, nausea and vomiting.  Hypertension and diabetes.  Smoking history.  Previous CABG.  CHEST - 2 VIEW  Comparison: Earlier same day  Findings: There has been previous median sternotomy and CABG. Cardiomegaly.  There is venous hypertension probably with early interstitial edema.  There is a tiny amount of fluid in the posterior costophrenic angles.  No significant bony finding.  IMPRESSION: Suspicion of developing congestive heart failure. Cardiomegaly with pulmonary venous hypertension and developing edema.   Original Report Authenticated By: Paulina Fusi, M.D.    Ct Head Wo Contrast  02/21/2013  *RADIOLOGY REPORT*  Clinical Data: Confusion and weakness.  CT HEAD  WITHOUT CONTRAST  Technique:  Contiguous axial images were obtained from the base of the skull through the vertex without contrast.  Comparison: None.  Findings: There is encephalomalacia in the left temporal lobe and inferior left frontal lobe.  There is diffuse cerebral atrophy. There is low density in the periventricular and subcortical white matter suggesting chronic changes.  No evidence for acute hemorrhage, midline shift or hydrocephalus.  Mild mucosal disease in the left ethmoid air cells.  No acute bony abnormality.  IMPRESSION: Negative for acute hemorrhage.  Areas of encephalomalacia in the left cerebrum are consistent with previous infarcts.  It would be difficult to exclude an acute infarct.  If there is concern for an acute infarct, recommend further evaluation with MRI.  Atrophy and evidence of chronic small vessel ischemic changes.   Original Report Authenticated By: Richarda Overlie, M.D.    Mr Brain Wo Contrast  02/21/2013  *RADIOLOGY REPORT*  Clinical Data: Confusion with dizziness and vomiting.  Rule out infarct.  MRI HEAD WITHOUT CONTRAST  Technique:  Multiplanar, multiecho pulse sequences of the brain and surrounding structures were obtained according to standard protocol without intravenous contrast.  Comparison: 02/21/2013 CT.  No comparison brain MR.  Findings: Motion degraded exam.  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  At the anterior aspect of this remote infarct, there is suggestion of small area of restricted motion (series 5 image 12 and series  500 image 12) which raises possibility of small acute infarct.  Very small acute infarct posterior right corona radiata (series 5 images 21 and 22).  Remote left frontal lobe infarct with moderate area of encephalomalacia.  Remote small left posterior lateral thalamic infarct.  Remote pontine infarct.  Remote tiny cerebellar infarcts bilaterally.  Prominent small vessel disease type  changes.  Global atrophy.  Ventricular prominence felt to be related to atrophy rather than hydrocephalus.  No intracranial mass lesion detected on this unenhanced exam.  Major intracranial vascular structures are patent.  No hemorrhage separate from the laminar necrosis.  IMPRESSION: Exam is motion degraded.  Several remote infarcts largest involving the left temporal lobe. Along the anterior aspect of this remote left temporal lobe infarct, possibility of small acute infarct is raised. Additionally a very small acute infarct of the posterior right corona radiata may be present.  Please see above for further detail   Original Report Authenticated By: Lacy Duverney, M.D.    Dg Abd Acute W/chest  02/21/2013  *RADIOLOGY REPORT*  Clinical Data: 67 year old male with nausea and vomiting.  ACUTE ABDOMEN SERIES (ABDOMEN 2 VIEW & CHEST 1 VIEW)  Comparison: Chest radiographs 06/23/2011.  CT 01/05/2008.  Findings: Mildly lower lung volumes.  Stable cardiac size and mediastinal contours.  Stable sequelae of CABG.  Stable increased interstitial markings are vascular congestion, no overt pulmonary edema.  No definite pleural effusion.  No pneumothorax or pneumoperitoneum.  Widespread severe calcified atherosclerosis in the abdomen and pelvis.  Much of the SMA is calcified.  Right upper quadrant small gallstones re-identified. Nonobstructed bowel gas pattern. No acute osseous abnormality identified.  IMPRESSION: 1. Nonobstructed bowel gas pattern, no free air. 2.  Lower lung volumes, no acute cardiopulmonary abnormality. 3.  Chronic severe calcified atherosclerosis.   Original Report Authenticated By: Erskine Speed, M.D.     Scheduled Meds: . atorvastatin  80 mg Oral q1800  . digoxin  0.125 mg Oral Daily  . digoxin  125 mcg Oral Daily  . docusate sodium  100 mg Oral Daily  . fenofibrate  160 mg Oral Daily  . furosemide  20 mg Oral Daily  . insulin aspart  0-15 Units Subcutaneous TID WC  . insulin aspart  0-5 Units  Subcutaneous QHS  . insulin glargine  10 Units Subcutaneous QHS  . levothyroxine  50 mcg Oral QAC breakfast  . levothyroxine  50 mcg Oral QAC breakfast  . metoprolol  12.5 mg Oral Daily  . multivitamin with minerals  1 tablet Oral Daily  . omega-3 acid ethyl esters  2 g Oral BID  . pantoprazole  40 mg Oral Daily  . simvastatin  10 mg Oral q1800  . sodium chloride  3 mL Intravenous Q12H  . warfarin  7.5 mg Oral ONCE-1800  . Warfarin - Pharmacist Dosing Inpatient   Does not apply q1800   Continuous Infusions: . sodium chloride 50 mL/hr at 02/21/13 2246  . sodium chloride    . diltiazem (CARDIZEM) infusion 5 mg/hr (02/21/13 1315)  . heparin 1,200 Units/hr (02/22/13 0647)   PRN Meds:acetaminophen, acetaminophen, ondansetron (ZOFRAN) IV, ondansetron  Assessment/Plan:  1. NAUSEA/VOMITING WEAKNESS/HYPOTENSION : seems quite a bit better 2. AFIB WITH RVR : better rate on cardiazem 3. RIGHT SIDED WEAKNESS : possible new small CVA 4. DM 2 WITH HIGH SUGARS : a bit better at 243 this AM 5. CRF/ARF : Cr still up at 1.74 6. PVD WITH AMPUTATIONS: no ischemic pain at rest 7. CAD: no CP  8. HYPOTHYROID: check TSH  9. HYPERLIPIDEMIA: TG were over 1000 at last check. Check pancreatic enzymes  10.COPD: still smoking    LOS: 1 day   [name] 02/22/2013, 10:55 AM

## 2013-02-22 NOTE — Progress Notes (Signed)
ANTICOAGULATION CONSULT NOTE - Follow Up Consult  Pharmacy Consult for heparin, warfarin Indication: atrial fibrillation  Labs:  Recent Labs  02/21/13 0953 02/22/13 0540  HGB 16.4 13.2  HCT 47.1 37.5*  PLT 156 122*  APTT 31  --   LABPROT 13.6 13.5  INR 1.05 1.04  HEPARINUNFRC  --  0.14*  CREATININE 1.85* 1.74*    Assessment: 66yo male subtherapeutic on heparin with initial dosing for Afib.  Goal of Therapy:  Heparin level 0.3-0.7 units/ml INR 2-3   Plan:  Will bolus with heparin 2000 units x1 and increase gtt by 3 units/kg/hr to 1200 units/hr and check level in 8hr.  Warfarin 7.5mg  x 1 dose today.  Continue daily protimes  Celedonio Miyamoto, PharmD, BCPS Clinical Pharmacist Pager [phone]   02/22/2013,9:10 AM

## 2013-02-22 NOTE — ED Provider Notes (Signed)
Medical screening examination/treatment/procedure(s) were performed by non-physician practitioner and as supervising physician I was immediately available for consultation/collaboration.  Chaniece Barbato R. Zac Torti, MD 02/22/13 1721 

## 2013-02-22 NOTE — Clinical Documentation Improvement (Signed)
CKD DOCUMENTATION CLARIFICATION QUERY                                                                                          02/22/13   Dear Dr.Zahi Plaskett,  In a better effort to capture your patient's severity of illness, reflect appropriate length of stay and utilization of resources, a review of the patient medical record has revealed the following indicators.    Based on your clinical judgment, please clarify and document in a progress note and/or discharge summary the clinical condition associated with the following supporting information: Past Medical History diagnosis of Chronic Kidney Disease (chronic renal insufficiency) on admission to hospital 02/21/13.    In responding to this query please exercise your independent judgment.  The fact that a query is asked, does not imply that any particular answer is desired or expected.  Possible Clinical Conditions?   _______CKD Stage I -  GFR > OR = 90 _______CKD Stage II - GFR 60-80 _______CKD Stage III - GFR 30-59 _______CKD Stage IV - GFR 15-29 _______CKD Stage V - GFR < 15 _______ESRD (End Stage Renal Disease) _______Other condition_____________   Supporting Information:  Lab:  Creatinine level (baseline and current)  Creatinine on 02/21/13 was 1.85 mg/dL Creatinine on 07/03/03 was 1.55 mg/dL  Calculated GFR:  GFR on 02/21/13 was 36 mL/min.   GFR on 06/28/11 was 45 mL/min.   You may use possible, probable, or suspect with inpatient documentation.  Possible, probable, suspected diagnoses MUST be documented at the time of discharge  Reviewed: additional documentation in the medical record   Thank You,  Darla Lesches, RN, BSN, CCRN Clinical Documentation Specialist Pager [phone]  Office [phone]  [facility] department [place]  I have added to the end of my H and P note the details concerning this patient's ckd and his arf. Thank you.

## 2013-02-22 NOTE — Progress Notes (Signed)
ANTICOAGULATION CONSULT NOTE - Follow Up Consult    HL = 0.4 (goal 0.3 - 0.7 units/mL) Heparin weight = 70 kg   Assessment: 66 YOM with atrial fibrillation to continue on IV heparin bridge while INR is sub-therapeutic.  Heparin level at goal; no bleeding reported.   Plan: - Continue heparin gtt at 1200 units/hr - F/U AM labs     [name] D. Laney Potash, PharmD, BCPS Pager:  [phone] 02/22/2013, 4:40 PM

## 2013-02-22 NOTE — ED Provider Notes (Signed)
Medical screening examination/treatment/procedure(s) were performed by non-physician practitioner and as supervising physician I was immediately available for consultation/collaboration.   Maguire Sime, MD 02/22/13 1742 

## 2013-02-22 NOTE — ED Provider Notes (Signed)
Patient seen and admitted by and admitted to Dr. Darcus Austin.   Jimmye Norman, NP 02/22/13 7829  Jimmye Norman, NP 02/22/13 [phone]

## 2013-02-23 LAB — CBC WITH DIFFERENTIAL/PLATELET
Basophils Absolute: 0.1 10*3/uL (ref 0.0–0.1)
Basophils Relative: 1 % (ref 0–1)
HCT: 37.9 % — ABNORMAL LOW (ref 39.0–52.0)
Lymphocytes Relative: 32 % (ref 12–46)
MCHC: 33.5 g/dL (ref 30.0–36.0)
Monocytes Absolute: 1.4 10*3/uL — ABNORMAL HIGH (ref 0.1–1.0)
Neutro Abs: 5.1 10*3/uL (ref 1.7–7.7)
Neutrophils Relative %: 51 % (ref 43–77)
Platelets: 115 10*3/uL — ABNORMAL LOW (ref 150–400)
RDW: 14.9 % (ref 11.5–15.5)
WBC: 9.9 10*3/uL (ref 4.0–10.5)

## 2013-02-23 LAB — COMPREHENSIVE METABOLIC PANEL
ALT: 21 U/L (ref 0–53)
Alkaline Phosphatase: 57 U/L (ref 39–117)
BUN: 36 mg/dL — ABNORMAL HIGH (ref 6–23)
CO2: 22 mEq/L (ref 19–32)
Chloride: 105 mEq/L (ref 96–112)
GFR calc Af Amer: 54 mL/min — ABNORMAL LOW (ref >90)
GFR calc non Af Amer: 46 mL/min — ABNORMAL LOW (ref >90)
Glucose, Bld: 163 mg/dL — ABNORMAL HIGH (ref 70–99)
Potassium: 3.8 mEq/L (ref 3.5–5.1)
Sodium: 137 mEq/L (ref 135–145)
Total Bilirubin: 0.4 mg/dL (ref 0.3–1.2)
Total Protein: 5.4 g/dL — ABNORMAL LOW (ref 6.0–8.3)

## 2013-02-23 LAB — TSH: TSH: 1.648 u[IU]/mL (ref 0.350–4.500)

## 2013-02-23 LAB — HEPARIN LEVEL (UNFRACTIONATED): Heparin Unfractionated: 0.31 IU/mL (ref 0.30–0.70)

## 2013-02-23 LAB — GLUCOSE, CAPILLARY
Glucose-Capillary: 170 mg/dL — ABNORMAL HIGH (ref 70–99)
Glucose-Capillary: 181 mg/dL — ABNORMAL HIGH (ref 70–99)
Glucose-Capillary: 181 mg/dL — ABNORMAL HIGH (ref 70–99)
Glucose-Capillary: 179 mg/dL — ABNORMAL HIGH (ref 70–99)

## 2013-02-23 LAB — LIPID PANEL
Cholesterol: 97 mg/dL (ref 0–200)
Total CHOL/HDL Ratio: 3.2 RATIO

## 2013-02-23 MED ORDER — WARFARIN SODIUM 5 MG PO TABS
5.0000 mg | ORAL_TABLET | Freq: Once | ORAL | Status: AC
  Administered 2013-02-23: 5 mg via ORAL
  Filled 2013-02-23: qty 1

## 2013-02-23 MED ORDER — HEPARIN (PORCINE) IN NACL 100-0.45 UNIT/ML-% IJ SOLN
1450.0000 [IU]/h | INTRAMUSCULAR | Status: DC
  Administered 2013-02-24: 1450 [IU]/h via INTRAVENOUS
  Administered 2013-02-24: 1300 [IU]/h via INTRAVENOUS
  Filled 2013-02-23 (×4): qty 250

## 2013-02-23 MED ORDER — DILTIAZEM HCL ER COATED BEADS 180 MG PO CP24
180.0000 mg | ORAL_CAPSULE | Freq: Every day | ORAL | Status: DC
  Administered 2013-02-23 – 2013-02-24 (×2): 180 mg via ORAL
  Filled 2013-02-23 (×2): qty 1

## 2013-02-23 NOTE — Progress Notes (Signed)
ANTICOAGULATION CONSULT NOTE - Follow Up Consult  Pharmacy Consult for heparin, warfarin Indication: atrial fibrillation  Labs:  Recent Labs  02/21/13 0953 02/22/13 0540 02/22/13 1547 02/23/13 0412  HGB 16.4 13.2  --  12.7*  HCT 47.1 37.5*  --  37.9*  PLT 156 122*  --  115*  APTT 31  --   --   --   LABPROT 13.6 13.5  --  18.2*  INR 1.05 1.04  --  1.56*  HEPARINUNFRC  --  0.14* 0.40 0.31  CREATININE 1.85* 1.74*  --  1.50*    Assessment: Heparin is now therapeutic but on the low side. INR 1.56 today so it did take a nice jump from yesterday. No bleeding.  Goal of Therapy:  Heparin level 0.3-0.7 units/ml INR 2-3   Plan:   Increase heparin to 1300 units/hr Coumadin 5mg  PO x1

## 2013-02-23 NOTE — Progress Notes (Signed)
Subjective: Feels better. No n/v. Normal BM. No pain. Breathing OK. Some buttocks soreness   Objective: Vital signs in last 24 hours: Temp:  [97.6 F (36.4 C)-98.5 F (36.9 C)] 98.4 F (36.9 C) (04/30 0800) Pulse Rate:  [55-76] 76 (04/30 0800) Resp:  [16-23] 16 (04/30 0800) BP: (107-124)/(35-65) 119/65 mmHg (04/30 0800) SpO2:  [91 %-96 %] 92 % (04/30 0800) Weight:  [86.5 kg (190 lb 11.2 oz)] 86.5 kg (190 lb 11.2 oz) (04/30 0418)  Intake/Output from previous day: 04/29 0701 - 04/30 0700 In: 1603.6 [P.O.:340; I.V.:1263.6] Out: 1000 [Urine:1000] Intake/Output this shift: Total I/O In: 450 [P.O.:450] Out: 250 [Urine:250]  General: alert Sitting up in no distress. Less facial weakness. Neck supple. Lungs distant but clear. Ht IR IR abd soft NT awake, mentating well. Lab Results   Recent Labs  02/22/13 0540 02/23/13 0412  WBC 10.8* 9.9  RBC 4.56 4.55  HGB 13.2 12.7*  HCT 37.5* 37.9*  MCV 82.2 83.3  MCH 28.9 27.9  RDW 14.9 14.9  PLT 122* 115*    Recent Labs  02/22/13 0540 02/23/13 0412  NA 142 137  K 3.5 3.8  CL 108 105  CO2 23 22  GLUCOSE 243* 163*  BUN 41* 36*  CREATININE 1.74* 1.50*  CALCIUM 7.9* 8.3*  Results for ZAI, CHMIEL (MRN [id number]) as of 02/23/2013 10:27  Ref. Range 02/23/2013 04:12  Cholesterol Latest Range: 0-200 mg/dL 97  Triglycerides Latest Range: <150 mg/dL 147 (H)  HDL Latest Range: >39 mg/dL 30 (L)  LDL (calc) Latest Range: 0-99 mg/dL 24  VLDL Latest Range: 0-40 mg/dL 43 (H)  Total CHOL/HDL Ratio No range found 3.2    Studies/Results: Dg Chest 2 View  02/21/2013  *RADIOLOGY REPORT*  Clinical Data: Weakness, nausea and vomiting.  Hypertension and diabetes.  Smoking history.  Previous CABG.  CHEST - 2 VIEW  Comparison: Earlier same day  Findings: There has been previous median sternotomy and CABG. Cardiomegaly.  There is venous hypertension probably with early interstitial edema.  There is a tiny amount of fluid in the posterior  costophrenic angles.  No significant bony finding.  IMPRESSION: Suspicion of developing congestive heart failure. Cardiomegaly with pulmonary venous hypertension and developing edema.   Original Report Authenticated By: Paulina Fusi, M.D.    Ct Head Wo Contrast  02/21/2013  *RADIOLOGY REPORT*  Clinical Data: Confusion and weakness.  CT HEAD WITHOUT CONTRAST  Technique:  Contiguous axial images were obtained from the base of the skull through the vertex without contrast.  Comparison: None.  Findings: There is encephalomalacia in the left temporal lobe and inferior left frontal lobe.  There is diffuse cerebral atrophy. There is low density in the periventricular and subcortical white matter suggesting chronic changes.  No evidence for acute hemorrhage, midline shift or hydrocephalus.  Mild mucosal disease in the left ethmoid air cells.  No acute bony abnormality.  IMPRESSION: Negative for acute hemorrhage.  Areas of encephalomalacia in the left cerebrum are consistent with previous infarcts.  It would be difficult to exclude an acute infarct.  If there is concern for an acute infarct, recommend further evaluation with MRI.  Atrophy and evidence of chronic small vessel ischemic changes.   Original Report Authenticated By: Richarda Overlie, M.D.    Mr Brain Wo Contrast  02/21/2013  *RADIOLOGY REPORT*  Clinical Data: Confusion with dizziness and vomiting.  Rule out infarct.  MRI HEAD WITHOUT CONTRAST  Technique:  Multiplanar, multiecho pulse sequences of the brain and surrounding structures were  obtained according to standard protocol without intravenous contrast.  Comparison: 02/21/2013 CT.  No comparison brain MR.  Findings: Motion degraded exam.  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  At the anterior aspect of this remote infarct, there is suggestion of small area of restricted motion (series 5 image 12 and series 500 image 12) which raises possibility of  small acute infarct.  Very small acute infarct posterior right corona radiata (series 5 images 21 and 22).  Remote left frontal lobe infarct with moderate area of encephalomalacia.  Remote small left posterior lateral thalamic infarct.  Remote pontine infarct.  Remote tiny cerebellar infarcts bilaterally.  Prominent small vessel disease type changes.  Global atrophy.  Ventricular prominence felt to be related to atrophy rather than hydrocephalus.  No intracranial mass lesion detected on this unenhanced exam.  Major intracranial vascular structures are patent.  No hemorrhage separate from the laminar necrosis.  IMPRESSION: Exam is motion degraded.  Several remote infarcts largest involving the left temporal lobe. Along the anterior aspect of this remote left temporal lobe infarct, possibility of small acute infarct is raised. Additionally a very small acute infarct of the posterior right corona radiata may be present.  Please see above for further detail   Original Report Authenticated By: Lacy Duverney, M.D.    Dg Abd Acute W/chest  02/21/2013  *RADIOLOGY REPORT*  Clinical Data: 67 year old male with nausea and vomiting.  ACUTE ABDOMEN SERIES (ABDOMEN 2 VIEW & CHEST 1 VIEW)  Comparison: Chest radiographs 06/23/2011.  CT 01/05/2008.  Findings: Mildly lower lung volumes.  Stable cardiac size and mediastinal contours.  Stable sequelae of CABG.  Stable increased interstitial markings are vascular congestion, no overt pulmonary edema.  No definite pleural effusion.  No pneumothorax or pneumoperitoneum.  Widespread severe calcified atherosclerosis in the abdomen and pelvis.  Much of the SMA is calcified.  Right upper quadrant small gallstones re-identified. Nonobstructed bowel gas pattern. No acute osseous abnormality identified.  IMPRESSION: 1. Nonobstructed bowel gas pattern, no free air. 2.  Lower lung volumes, no acute cardiopulmonary abnormality. 3.  Chronic severe calcified atherosclerosis.   Original Report  Authenticated By: Erskine Speed, M.D.     Scheduled Meds: . atorvastatin  80 mg Oral q1800  . digoxin  0.125 mg Oral Daily  . digoxin  125 mcg Oral Daily  . diltiazem  180 mg Oral Daily  . docusate sodium  100 mg Oral Daily  . fenofibrate  160 mg Oral Daily  . furosemide  20 mg Oral Daily  . insulin aspart  0-15 Units Subcutaneous TID WC  . insulin aspart  0-5 Units Subcutaneous QHS  . insulin glargine  18 Units Subcutaneous QHS  . levothyroxine  50 mcg Oral QAC breakfast  . levothyroxine  50 mcg Oral QAC breakfast  . metoprolol  12.5 mg Oral Daily  . multivitamin with minerals  1 tablet Oral Daily  . omega-3 acid ethyl esters  2 g Oral BID  . pantoprazole  40 mg Oral Daily  . potassium chloride  20 mEq Oral BID  . sodium chloride  3 mL Intravenous Q12H  . warfarin  5 mg Oral ONCE-1800  . Warfarin - Pharmacist Dosing Inpatient   Does not apply q1800   Continuous Infusions: . sodium chloride 50 mL/hr at 02/22/13 1359  . sodium chloride    . heparin 1,300 Units/hr (02/23/13 0945)   PRN Meds:acetaminophen, acetaminophen, ondansetron (ZOFRAN) IV, ondansetron  Assessment/Plan:  1. NAUSEA/VOMITING WEAKNESS/HYPOTENSION :resolved  2. AFIB WITH RVR : change to PO cardiazem 3. RIGHT SIDED WEAKNESS : possible new small CVA , INR subtherapeutic 4. DM 2 WITH HIGH SUGARS : better at 163 this AM  5. CRF/ARF : Cr better at 1.50 6. PVD WITH AMPUTATIONS: no ischemic pain at rest 7. CAD: no CP  8. HYPOTHYROID: check TSH was 1.668 9. HYPERLIPIDEMIA: much better, see above 10.COPD: still smoking    LOS: 2 days   Latifa Noble [name] 02/23/2013, 10:24 AM

## 2013-02-23 NOTE — Progress Notes (Signed)
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  No n/v at this time, eating diet without probloem.  IV infusing Lt hand NS @ 50cc/hr and Heparin drip @ 13cc/hr in Rt hand.  Both are within date.  Telemetry applied, in atrial fib.  Neuro check normal.  Oriented to the unit, call bell & telephone within reach, instructions to call for assistance, bed alarm on.  Bonney Leitz RN

## 2013-02-24 LAB — CBC WITH DIFFERENTIAL/PLATELET
Eosinophils Absolute: 0.1 10*3/uL (ref 0.0–0.7)
Eosinophils Relative: 0 % (ref 0–5)
Hemoglobin: 13.2 g/dL (ref 13.0–17.0)
Lymphocytes Relative: 20 % (ref 12–46)
Lymphs Abs: 2.4 10*3/uL (ref 0.7–4.0)
MCH: 28 pg (ref 26.0–34.0)
MCV: 81.7 fL (ref 78.0–100.0)
Monocytes Relative: 13 % — ABNORMAL HIGH (ref 3–12)
Neutrophils Relative %: 67 % (ref 43–77)
RBC: 4.71 MIL/uL (ref 4.22–5.81)
WBC: 12.5 10*3/uL — ABNORMAL HIGH (ref 4.0–10.5)

## 2013-02-24 LAB — COMPREHENSIVE METABOLIC PANEL
AST: 24 U/L (ref 0–37)
Albumin: 2.7 g/dL — ABNORMAL LOW (ref 3.5–5.2)
BUN: 21 mg/dL (ref 6–23)
Calcium: 8.8 mg/dL (ref 8.4–10.5)
Creatinine, Ser: 1.15 mg/dL (ref 0.50–1.35)

## 2013-02-24 LAB — GLUCOSE, CAPILLARY
Glucose-Capillary: 204 mg/dL — ABNORMAL HIGH (ref 70–99)
Glucose-Capillary: 169 mg/dL — ABNORMAL HIGH (ref 70–99)

## 2013-02-24 LAB — PROTIME-INR
INR: 2.09 — ABNORMAL HIGH (ref 0.00–1.49)
Prothrombin Time: 22.6 seconds — ABNORMAL HIGH (ref 11.6–15.2)

## 2013-02-24 LAB — HEPARIN LEVEL (UNFRACTIONATED): Heparin Unfractionated: 0.19 IU/mL — ABNORMAL LOW (ref 0.30–0.70)

## 2013-02-24 MED ORDER — WARFARIN SODIUM 4 MG PO TABS
4.5000 mg | ORAL_TABLET | Freq: Every day | ORAL | Status: DC
  Filled 2013-02-24: qty 1

## 2013-02-24 MED ORDER — DILTIAZEM HCL ER COATED BEADS 180 MG PO CP24: 180.0000 mg | ORAL_CAPSULE | Freq: Every day | ORAL | Status: DC

## 2013-02-24 NOTE — Progress Notes (Signed)
ANTICOAGULATION CONSULT NOTE - Follow Up Consult   Pharmacy Consult :  Coumadin with Heparin bridging  Indication: atrial fibrillation  Dosing Weight: 70 kg  Labs:  Recent Labs  02/22/13 0540 02/22/13 1547 02/23/13 0412 02/24/13 0540  HGB 13.2  --  12.7* 13.2  HCT 37.5*  --  37.9* 38.5*  PLT 122*  --  115* 120*  INR 1.04  --  1.56* 2.09*  HEPARINUNFRC 0.14* 0.40 0.31 0.19*  CREATININE 1.74*  --  1.50* 1.15   Lab Results  Component Value Date   INR 2.09* 02/24/2013   INR 1.56* 02/23/2013   INR 1.04 02/22/2013   Estimated Creatinine Clearance: 57.9 ml/min (by C-G formula based on Cr of 1.15).  Medications:  Scheduled:  . atorvastatin  80 mg Oral q1800  . digoxin  125 mcg Oral Daily  . diltiazem  180 mg Oral Daily  . docusate sodium  100 mg Oral Daily  . fenofibrate  160 mg Oral Daily  . furosemide  20 mg Oral Daily  . insulin aspart  0-15 Units Subcutaneous TID WC  . insulin aspart  0-5 Units Subcutaneous QHS  . insulin glargine  18 Units Subcutaneous QHS  . levothyroxine  50 mcg Oral QAC breakfast  . metoprolol  12.5 mg Oral Daily  . multivitamin with minerals  1 tablet Oral Daily  . omega-3 acid ethyl esters  2 g Oral BID  . pantoprazole  40 mg Oral Daily  . potassium chloride  20 mEq Oral BID  . sodium chloride  3 mL Intravenous Q12H  . [COMPLETED] warfarin  5 mg Oral ONCE-1800  . Warfarin - Pharmacist Dosing Inpatient   Does not apply q1800  . [DISCONTINUED] digoxin  0.125 mg Oral Daily  . [DISCONTINUED] levothyroxine  50 mcg Oral QAC breakfast   Infusions:  . heparin 1,300 Units/hr (02/24/13 0905)   Assessment:  Day # 4/5 Heparin bridging for Coumadin restart in patient with a history of atrial fibrillation   Heparin rate = 1300  Units/hr, Heparin level SUBtherapeutic 0.19 units/ml.  INR 2.09 [takes 5 days until patient totally anticoagulated].  .No bleeding complications noted   Goal of Therapy:  INR 2-3  Heparin level 0.3-0.7 units/ml   Plan:    Continue Heparin for total of 5 days unless patient discharged early.  Increase Heparin to 1450 units/hr, Will Check Hep Level in 6 hours   Coumadin 4.5 mg daily.  Nuh Lipton, Elisha Headland, Pharm.D.  02/24/2013,12:58 PM

## 2013-02-24 NOTE — Progress Notes (Signed)
Leonard Downing Klees to be D/C'd Home per MD order.  Discussed with the patient and all questions fully answered.    Medication List    TAKE these medications       benazepril-hydrochlorthiazide 10-12.5 MG per tablet  Commonly known as:  LOTENSIN HCT  Take 1 tablet by mouth daily.     digoxin 0.125 MG tablet  Commonly known as:  LANOXIN  Take 125 mcg by mouth daily.     diltiazem 180 MG 24 hr capsule  Commonly known as:  CARDIZEM CD  Take 1 capsule (180 mg total) by mouth daily.     docusate sodium 100 MG capsule  Commonly known as:  COLACE  Take 100 mg by mouth daily.     fenofibrate 160 MG tablet  Take 160 mg by mouth daily.     fish oil-omega-3 fatty acids 1000 MG capsule  Take 1 g by mouth daily.     furosemide 20 MG tablet  Commonly known as:  LASIX  Take 20 mg by mouth daily.     glimepiride 4 MG tablet  Commonly known as:  AMARYL  Take 2 mg by mouth daily before breakfast. Take 1/2 tab daily before bkfst.     levothyroxine 50 MCG tablet  Commonly known as:  SYNTHROID, LEVOTHROID  Take 50 mcg by mouth daily.     metoprolol 50 MG tablet  Commonly known as:  LOPRESSOR  Take 25 mg by mouth daily. Take 1/2 tab daily     multivitamin tablet  Take 1 tablet by mouth daily.     pravastatin 20 MG tablet  Commonly known as:  PRAVACHOL  Take 20 mg by mouth daily.     warfarin 3 MG tablet  Commonly known as:  COUMADIN  Take 1.5 mg by mouth daily.        VVS, Skin clean, dry and intact without evidence of skin break down, no evidence of skin tears noted. IV catheter discontinued intact. Site without signs and symptoms of complications. Dressing and pressure applied.  An After Visit Summary was printed and given to the patient. Follow up appointments , new prescriptions and medication administration times given. Education on A fib given to pt and son. Patient escorted via WC, and D/C home via private auto.  Cindra Eves, RN 02/24/2013 5:22 PM

## 2013-02-24 NOTE — Discharge Summary (Signed)
[name] DISCHARGE SUMMARY  [name]  MR#: [id number]  DOB:[birth year]  Date of Admission: 02/21/2013 Date of Discharge: 02/24/2013  Attending Physician:Eulia Hatcher [name]  Patient's XLK:[id number] Manuel Diener, MD  Consults: none  Discharge Diagnoses: Active Problems:  1. NAUSEA/VOMITING WEAKNESS/HYPOTENSION likely viral , resolved 2. AFIB WITH RVR : improved rate 3. RIGHT SIDED WEAKNESS : possible new small CVA , INR subtherapeutic   Along the anterior aspect of this remote left temporal lobe infarct, possibility of small acute infarct is raised. Additionally a very small acute infarct of the posterior right corona radiata may be present. 4. DM 2 WITH HIGH SUGARS : Improved, 250.42 5. CRF/ARF : Cr better at 1. 15 6. PVD WITH AMPUTATIONS: no ischemic pain at rest 7. CAD: no CP . Mild fluid overload, improved 8. HYPOTHYROID:  TSH was 1.668  9. HYPERLIPIDEMIA: much better, 10.COPD: Severe 11.   Depression, fair    Discharge Medications:   Medication List    TAKE these medications       benazepril-hydrochlorthiazide 10-12.5 MG per tablet  Commonly known as:  LOTENSIN HCT  Take 1 tablet by mouth daily.     digoxin 0.125 MG tablet  Commonly known as:  LANOXIN  Take 125 mcg by mouth daily.     diltiazem 180 MG 24 hr capsule  Commonly known as:  CARDIZEM CD  Take 1 capsule (180 mg total) by mouth daily.     docusate sodium 100 MG capsule  Commonly known as:  COLACE  Take 100 mg by mouth daily.     fenofibrate 160 MG tablet  Take 160 mg by mouth daily.     fish oil-omega-3 fatty acids 1000 MG capsule  Take 1 g by mouth daily.     furosemide 20 MG tablet  Commonly known as:  LASIX  Take 20 mg by mouth daily.     glimepiride 4 MG tablet  Commonly known as:  AMARYL  Take 2 mg by mouth daily before breakfast. Take 1/2 tab daily before bkfst.     levothyroxine 50 MCG tablet  Commonly known as:  SYNTHROID, LEVOTHROID  Take 50 mcg by mouth daily.     metoprolol 50  MG tablet  Commonly known as:  LOPRESSOR  Take 25 mg by mouth daily. Take 1/2 tab daily     multivitamin tablet  Take 1 tablet by mouth daily.     pravastatin 20 MG tablet  Commonly known as:  PRAVACHOL  Take 20 mg by mouth daily.     warfarin 3 MG tablet  Commonly known as:  COUMADIN  Take 1.5 mg by mouth daily.       return to prior insulin regimen  Hospital Procedures: Dg Chest 2 View  02/21/2013  *RADIOLOGY REPORT*  Clinical Data: Weakness, nausea and vomiting.  Hypertension and diabetes.  Smoking history.  Previous CABG.  CHEST - 2 VIEW  Comparison: Earlier same day  Findings: There has been previous median sternotomy and CABG. Cardiomegaly.  There is venous hypertension probably with early interstitial edema.  There is a tiny amount of fluid in the posterior costophrenic angles.  No significant bony finding.  IMPRESSION: Suspicion of developing congestive heart failure. Cardiomegaly with pulmonary venous hypertension and developing edema.   Original Report Authenticated By: Paulina Fusi, M.D.    Ct Head Wo Contrast  02/21/2013  *RADIOLOGY REPORT*  Clinical Data: Confusion and weakness.  CT HEAD WITHOUT CONTRAST  Technique:  Contiguous axial images were obtained from the base of the skull  through the vertex without contrast.  Comparison: None.  Findings: There is encephalomalacia in the left temporal lobe and inferior left frontal lobe.  There is diffuse cerebral atrophy. There is low density in the periventricular and subcortical white matter suggesting chronic changes.  No evidence for acute hemorrhage, midline shift or hydrocephalus.  Mild mucosal disease in the left ethmoid air cells.  No acute bony abnormality.  IMPRESSION: Negative for acute hemorrhage.  Areas of encephalomalacia in the left cerebrum are consistent with previous infarcts.  It would be difficult to exclude an acute infarct.  If there is concern for an acute infarct, recommend further evaluation with MRI.  Atrophy and  evidence of chronic small vessel ischemic changes.   Original Report Authenticated By: Richarda Overlie, M.D.    Mr Brain Wo Contrast  02/21/2013  *RADIOLOGY REPORT*  Clinical Data: Confusion with dizziness and vomiting.  Rule out infarct.  MRI HEAD WITHOUT CONTRAST  Technique:  Multiplanar, multiecho pulse sequences of the brain and surrounding structures were obtained according to standard protocol without intravenous contrast.  Comparison: 02/21/2013 CT.  No comparison brain MR.  Findings: Motion degraded exam.  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  At the anterior aspect of this remote infarct, there is suggestion of small area of restricted motion (series 5 image 12 and series 500 image 12) which raises possibility of small acute infarct.  Very small acute infarct posterior right corona radiata (series 5 images 21 and 22).  Remote left frontal lobe infarct with moderate area of encephalomalacia.  Remote small left posterior lateral thalamic infarct.  Remote pontine infarct.  Remote tiny cerebellar infarcts bilaterally.  Prominent small vessel disease type changes.  Global atrophy.  Ventricular prominence felt to be related to atrophy rather than hydrocephalus.  No intracranial mass lesion detected on this unenhanced exam.  Major intracranial vascular structures are patent.  No hemorrhage separate from the laminar necrosis.  IMPRESSION: Exam is motion degraded.  Several remote infarcts largest involving the left temporal lobe. Along the anterior aspect of this remote left temporal lobe infarct, possibility of small acute infarct is raised. Additionally a very small acute infarct of the posterior right corona radiata may be present.  Please see above for further detail   Original Report Authenticated By: Lacy Duverney, M.D.    Dg Abd Acute W/chest  02/21/2013  *RADIOLOGY REPORT*  Clinical Data: 67 year old male with nausea and vomiting.  ACUTE  ABDOMEN SERIES (ABDOMEN 2 VIEW & CHEST 1 VIEW)  Comparison: Chest radiographs 06/23/2011.  CT 01/05/2008.  Findings: Mildly lower lung volumes.  Stable cardiac size and mediastinal contours.  Stable sequelae of CABG.  Stable increased interstitial markings are vascular congestion, no overt pulmonary edema.  No definite pleural effusion.  No pneumothorax or pneumoperitoneum.  Widespread severe calcified atherosclerosis in the abdomen and pelvis.  Much of the SMA is calcified.  Right upper quadrant small gallstones re-identified. Nonobstructed bowel gas pattern. No acute osseous abnormality identified.  IMPRESSION: 1. Nonobstructed bowel gas pattern, no free air. 2.  Lower lung volumes, no acute cardiopulmonary abnormality. 3.  Chronic severe calcified atherosclerosis.   Original Report Authenticated By: Erskine Speed, M.D.     History of Present Illness: Weakness  Hospital Course:  This is a 67 year old white male with severe vascular disease, poorly: Controlled Diabetes, and heart disease. He presented with weakness nausea vomiting and hypotension. He was found to be in rapid atrial fibrillation was started on a Cardizem drip  in the emergency room. The GI symptoms resolved fairly promptly. The hypotension also resolved with hydration. Neurologic workup showed what could be 2 tiny new strokes. He has some therapeutic INR at presentation. He was started on heparin drip and now is been in the therapeutic range this morning with INR 2.06. His neurologic status is improved dramatically since arrival. The present time is taking the diabetic diet without difficulty. His bowels are working well. He notes no chest pain or shortness of breath. His atrial fib rate control is improved. His blood pressures improved. He's been transitioned to oral Cardizem. His blood sugars are doing much better. His renal function is better than his usual baseline with creatinine 1.15. Electrolytes are doing well. He is having no pain. He  has no ischemic stump pain at rest. Oxygen saturations are reasonable. His lipids looks the best they have in years.  Day of Discharge Exam BP 137/54  Pulse 90  Temp(Src) 99.6 F (37.6 C) (Oral)  Resp 24  Ht 5' (1.524 m)  Wt 89.3 kg (196 lb 13.9 oz)  BMI 38.45 kg/m2  SpO2 90%  Physical Exam: General appearance: White male lying at 30 no distress. Mild right facial weakness is present. Oral mucous membranes are moist.  Eyes: no scleral icterus  Resp: clear and distant with no wheezes Cardio: Irregular irregular with systolic murmur  GI: soft, non-tender; bowel sounds normal; no masses,  no organomegaly Extremities: healed bilateral amputations with no breakdown  Neuro: Patient's awake alert and mentating fairly well no tremor is present mild right hand weakness is present. No dysphoria is present speech is clear and fluent  Discharge Labs:  Recent Labs  02/23/13 0412 02/24/13 0540  NA 137 138  K 3.8 4.3  CL 105 106  CO2 22 23  GLUCOSE 163* 185*  BUN 36* 21  CREATININE 1.50* 1.15  CALCIUM 8.3* 8.8    Recent Labs  02/23/13 0412 02/24/13 0540  AST 39* 24  ALT 21 21  ALKPHOS 57 63  BILITOT 0.4 0.6  PROT 5.4* 5.8*  ALBUMIN 2.6* 2.7*    Recent Labs  02/23/13 0412 02/24/13 0540  WBC 9.9 12.5*  NEUTROABS 5.1 8.4*  HGB 12.7* 13.2  HCT 37.9* 38.5*  MCV 83.3 81.7  PLT 115* 120*   Lab Results  Component Value Date   INR 2.09* 02/24/2013   INR 1.56* 02/23/2013   INR 1.04 02/22/2013     Recent Labs  02/23/13 0412  TSH 1.648     Discharge instructions: Come Monday for a coumadin check Use your home oxygen if needed.  Return to prior insulin Rx, we need to keep your sugars down Call if new neurologic symptoms occur Stop smoking You have one new medicine, DILTIAZEM, one each AM. This is to help keep the heart rate under control   Disposition: home  Follow-up Appts: Follow-up with Dr. Evlyn Kanner at [facility] in 1-2 weeks  Call for  appointment.  Condition on Discharge: improved   Tests Needing Follow-up:  none   Signed: Barney Russomanno [name] 02/24/2013, 11:00 AM

## 2013-02-25 NOTE — Care Management Note (Signed)
    Page 1 of 1   02/25/2013     4:39:11 PM   CARE MANAGEMENT NOTE 02/25/2013  Patient:  [name], [name]   Account Number:  [id number]  Date Initiated:  02/25/2013  Documentation initiated by:  Letha Cape  Subjective/Objective Assessment:   dx n/v hypotension  admit- lives with spouse.     Action/Plan:   Anticipated DC Date:  02/24/2013   Anticipated DC Plan:  HOME/SELF CARE      DC Planning Services  CM consult      Choice offered to / List presented to:             Status of service:  Completed, signed off Medicare Important Message given?   (If response is "NO", the following Medicare IM given date fields will be blank) Date Medicare IM given:   Date Additional Medicare IM given:    Discharge Disposition:  HOME/SELF CARE  Per UR Regulation:  Reviewed for med. necessity/level of care/duration of stay  If discussed at Long Length of Stay Meetings, dates discussed:    Comments:  02/25/13 16:38 Letha Cape RN, BSn [phone] patient lives with spouse, pta indep.  No needs anticipated.

## 2014-03-27 IMAGING — CR DG CHEST 2V
2 series · 2 of 2 positions shown · non-contrast
Comparison: Earlier same day

CLINICAL DATA: Weakness, nausea and vomiting.  Hypertension and
diabetes.  Smoking history.  Previous CABG.

CHEST - 2 VIEW

[w chest lat]
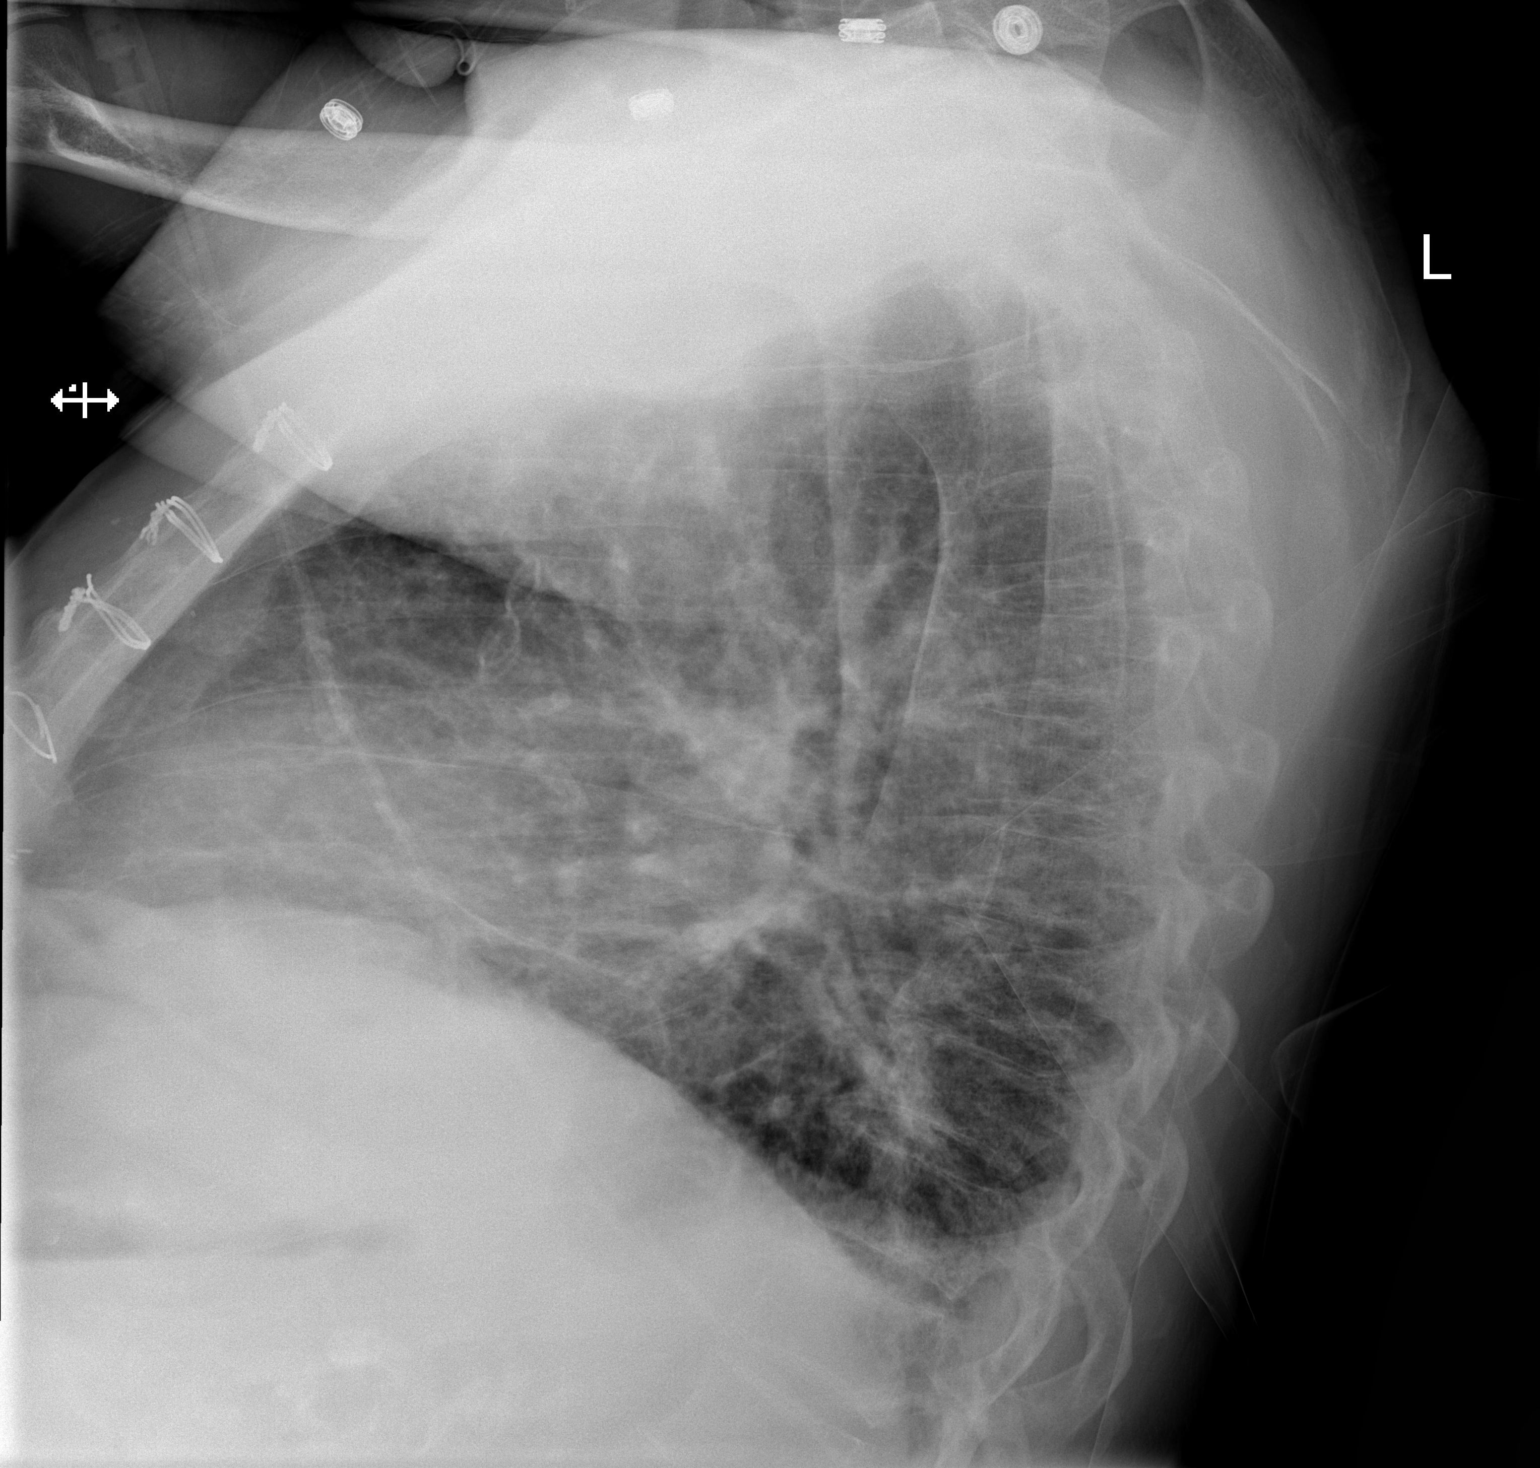

[x chest ap]
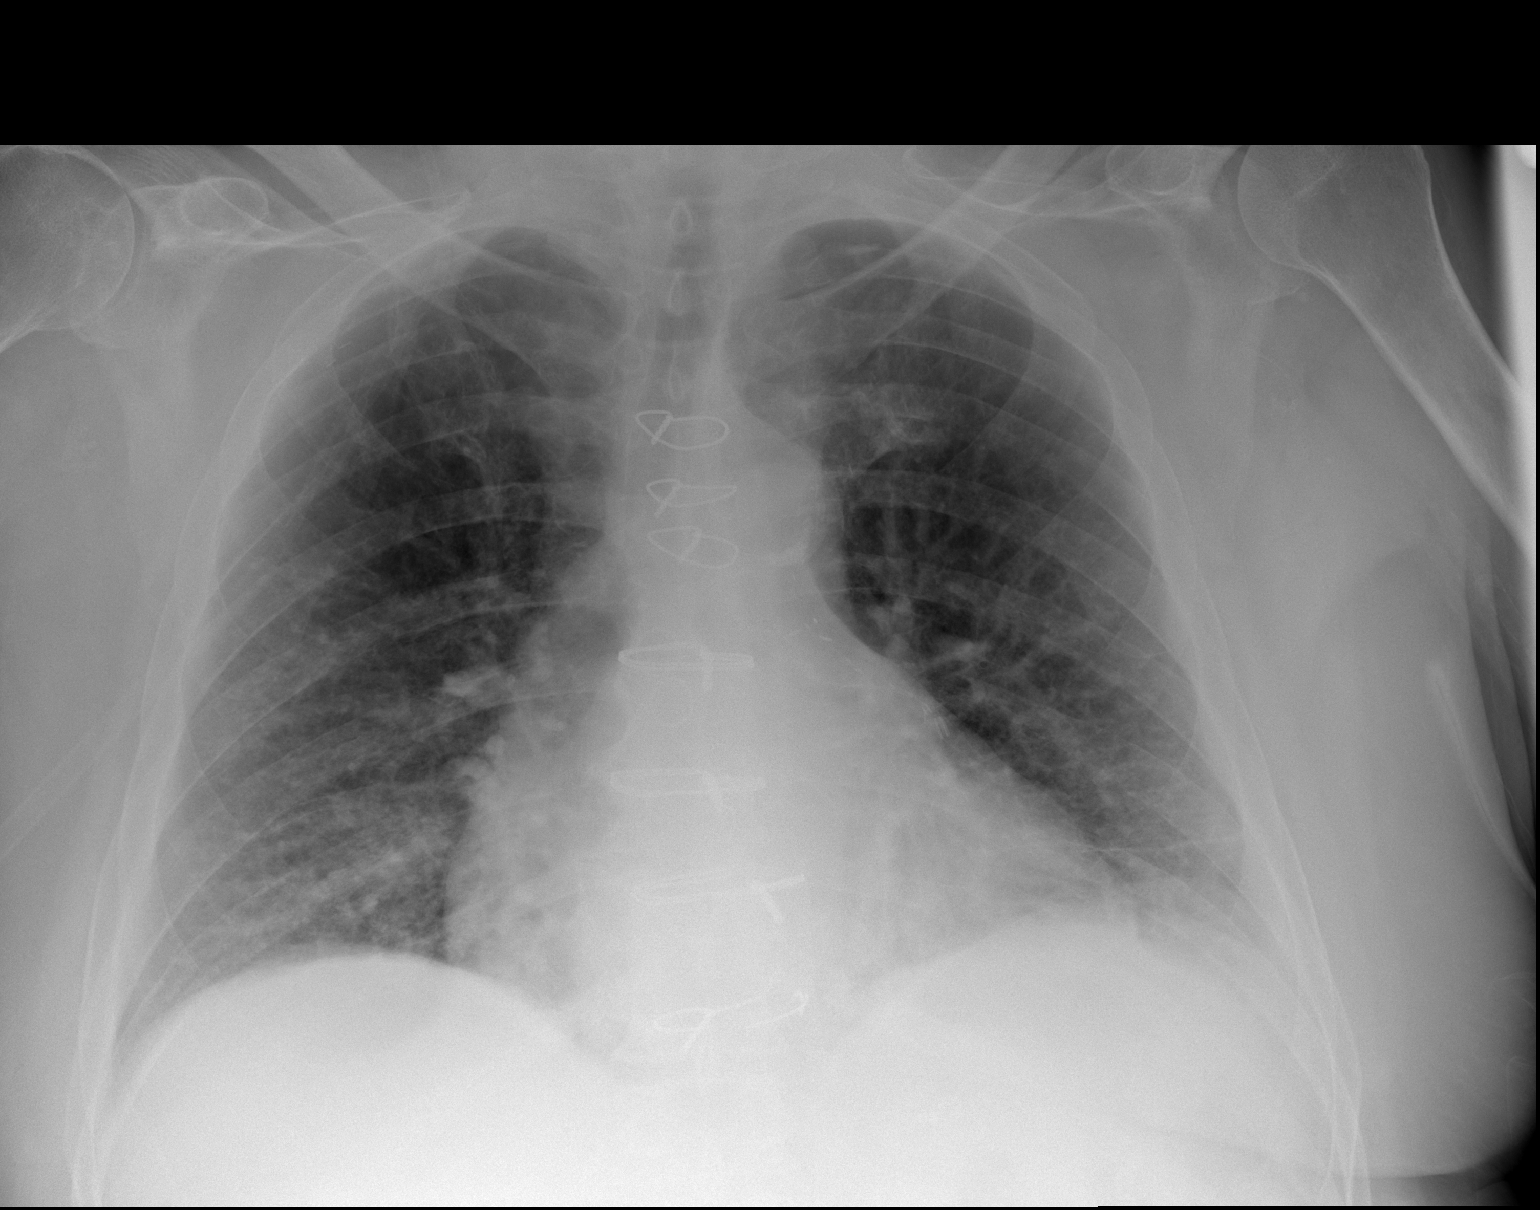

[2 of 2 positions shown; findings below may reference images not displayed]

FINDINGS: There has been previous median sternotomy and CABG.
Cardiomegaly.  There is venous hypertension probably with early
interstitial edema.  There is a tiny amount of fluid in the
posterior costophrenic angles.  No significant bony finding.
IMPRESSION: Suspicion of developing congestive heart failure. Cardiomegaly with
pulmonary venous hypertension and developing edema.

## 2014-05-24 ENCOUNTER — Ambulatory Visit (HOSPITAL_COMMUNITY)
Admission: RE | Admit: 2014-05-24 | Discharge: 2014-05-24 | Disposition: A | Discharge status: Home / Self Care | Payer: Medicare HMO | Source: Ambulatory Visit | Attending: Vascular Surgery | Admitting: Vascular Surgery

## 2014-05-24 ENCOUNTER — Other Ambulatory Visit (HOSPITAL_COMMUNITY): Payer: Self-pay | Admitting: Ophthalmology

## 2014-05-24 DIAGNOSIS — H3410 Central retinal artery occlusion, unspecified eye: Secondary | ICD-10-CM | POA: Diagnosis not present

## 2014-05-24 DIAGNOSIS — H3411 Central retinal artery occlusion, right eye: Secondary | ICD-10-CM

## 2014-07-14 ENCOUNTER — Encounter (HOSPITAL_COMMUNITY): Payer: Self-pay | Admitting: Emergency Medicine

## 2014-07-14 ENCOUNTER — Emergency Department (HOSPITAL_COMMUNITY)
Admission: EM | Admit: 2014-07-14 | Discharge: 2014-07-14 | Disposition: A | Discharge status: Other Acute Inpatient Hospital | Payer: Commercial Managed Care - HMO | Attending: Emergency Medicine | Admitting: Emergency Medicine

## 2014-07-14 DIAGNOSIS — T2122XA Burn of second degree of abdominal wall, initial encounter: Secondary | ICD-10-CM | POA: Diagnosis not present

## 2014-07-14 DIAGNOSIS — X12XXXA Contact with other hot fluids, initial encounter: Secondary | ICD-10-CM | POA: Insufficient documentation

## 2014-07-14 DIAGNOSIS — Z8719 Personal history of other diseases of the digestive system: Secondary | ICD-10-CM | POA: Diagnosis not present

## 2014-07-14 DIAGNOSIS — I129 Hypertensive chronic kidney disease with stage 1 through stage 4 chronic kidney disease, or unspecified chronic kidney disease: Secondary | ICD-10-CM | POA: Insufficient documentation

## 2014-07-14 DIAGNOSIS — N189 Chronic kidney disease, unspecified: Secondary | ICD-10-CM | POA: Diagnosis not present

## 2014-07-14 DIAGNOSIS — I4891 Unspecified atrial fibrillation: Secondary | ICD-10-CM | POA: Diagnosis not present

## 2014-07-14 DIAGNOSIS — E785 Hyperlipidemia, unspecified: Secondary | ICD-10-CM | POA: Diagnosis not present

## 2014-07-14 DIAGNOSIS — Z862 Personal history of diseases of the blood and blood-forming organs and certain disorders involving the immune mechanism: Secondary | ICD-10-CM | POA: Diagnosis not present

## 2014-07-14 DIAGNOSIS — Z951 Presence of aortocoronary bypass graft: Secondary | ICD-10-CM | POA: Diagnosis not present

## 2014-07-14 DIAGNOSIS — Y9389 Activity, other specified: Secondary | ICD-10-CM | POA: Insufficient documentation

## 2014-07-14 DIAGNOSIS — Z79899 Other long term (current) drug therapy: Secondary | ICD-10-CM | POA: Insufficient documentation

## 2014-07-14 DIAGNOSIS — E119 Type 2 diabetes mellitus without complications: Secondary | ICD-10-CM | POA: Diagnosis not present

## 2014-07-14 DIAGNOSIS — F17200 Nicotine dependence, unspecified, uncomplicated: Secondary | ICD-10-CM | POA: Diagnosis not present

## 2014-07-14 DIAGNOSIS — I2510 Atherosclerotic heart disease of native coronary artery without angina pectoris: Secondary | ICD-10-CM | POA: Insufficient documentation

## 2014-07-14 DIAGNOSIS — S78119A Complete traumatic amputation at level between unspecified hip and knee, initial encounter: Secondary | ICD-10-CM | POA: Insufficient documentation

## 2014-07-14 DIAGNOSIS — Z7901 Long term (current) use of anticoagulants: Secondary | ICD-10-CM | POA: Insufficient documentation

## 2014-07-14 DIAGNOSIS — Y929 Unspecified place or not applicable: Secondary | ICD-10-CM | POA: Insufficient documentation

## 2014-07-14 DIAGNOSIS — X131XXA Other contact with steam and other hot vapors, initial encounter: Secondary | ICD-10-CM | POA: Diagnosis not present

## 2014-07-14 DIAGNOSIS — E039 Hypothyroidism, unspecified: Secondary | ICD-10-CM | POA: Insufficient documentation

## 2014-07-14 LAB — CBC WITH DIFFERENTIAL/PLATELET
BASOS ABS: 0 10*3/uL (ref 0.0–0.1)
BASOS PCT: 0 % (ref 0–1)
Eosinophils Absolute: 0.1 10*3/uL (ref 0.0–0.7)
Eosinophils Relative: 1 % (ref 0–5)
HCT: 47.8 % (ref 39.0–52.0)
Hemoglobin: 16 g/dL (ref 13.0–17.0)
Lymphocytes Relative: 16 % (ref 12–46)
Lymphs Abs: 2.6 10*3/uL (ref 0.7–4.0)
MCH: 29.7 pg (ref 26.0–34.0)
MCHC: 33.5 g/dL (ref 30.0–36.0)
MCV: 88.8 fL (ref 78.0–100.0)
Monocytes Absolute: 1.4 10*3/uL — ABNORMAL HIGH (ref 0.1–1.0)
Monocytes Relative: 9 % (ref 3–12)
NEUTROS ABS: 11.8 10*3/uL — AB (ref 1.7–7.7)
Neutrophils Relative %: 74 % (ref 43–77)
PLATELETS: 263 10*3/uL (ref 150–400)
RBC: 5.38 MIL/uL (ref 4.22–5.81)
RDW: 15.3 % (ref 11.5–15.5)
WBC: 15.8 10*3/uL — ABNORMAL HIGH (ref 4.0–10.5)

## 2014-07-14 LAB — COMPREHENSIVE METABOLIC PANEL
ALBUMIN: 3.4 g/dL — AB (ref 3.5–5.2)
ALK PHOS: 37 U/L — AB (ref 39–117)
ALT: 21 U/L (ref 0–53)
ANION GAP: 17 — AB (ref 5–15)
AST: 24 U/L (ref 0–37)
BILIRUBIN TOTAL: 0.4 mg/dL (ref 0.3–1.2)
BUN: 41 mg/dL — ABNORMAL HIGH (ref 6–23)
CHLORIDE: 99 meq/L (ref 96–112)
CO2: 25 mEq/L (ref 19–32)
Calcium: 9.2 mg/dL (ref 8.4–10.5)
Creatinine, Ser: 2.5 mg/dL — ABNORMAL HIGH (ref 0.50–1.35)
GFR calc non Af Amer: 25 mL/min — ABNORMAL LOW (ref >90)
GFR, EST AFRICAN AMERICAN: 29 mL/min — AB (ref >90)
Glucose, Bld: 94 mg/dL (ref 70–99)
POTASSIUM: 4.3 meq/L (ref 3.7–5.3)
SODIUM: 141 meq/L (ref 137–147)
Total Protein: 6.7 g/dL (ref 6.0–8.3)

## 2014-07-14 LAB — PROTIME-INR
INR: 2.29 — ABNORMAL HIGH (ref 0.00–1.49)
Prothrombin Time: 25.2 seconds — ABNORMAL HIGH (ref 11.6–15.2)

## 2014-07-14 MED ORDER — SILVER SULFADIAZINE 1 % EX CREA
TOPICAL_CREAM | Freq: Once | CUTANEOUS | Status: AC
  Administered 2014-07-14: 06:00:00 via TOPICAL
  Filled 2014-07-14: qty 85

## 2014-07-14 MED ORDER — SODIUM CHLORIDE 0.9 % IV BOLUS (SEPSIS)
500.0000 mL | Freq: Once | INTRAVENOUS | Status: AC
  Administered 2014-07-14: 500 mL via INTRAVENOUS

## 2014-07-14 MED ORDER — FENTANYL CITRATE 0.05 MG/ML IJ SOLN
50.0000 ug | Freq: Once | INTRAMUSCULAR | Status: AC
  Administered 2014-07-14: 50 ug via INTRAVENOUS
  Filled 2014-07-14: qty 2
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
## 2014-08-15 ENCOUNTER — Emergency Department (HOSPITAL_COMMUNITY): Payer: Medicare HMO

## 2014-08-15 ENCOUNTER — Inpatient Hospital Stay (HOSPITAL_COMMUNITY)
Admission: EM | Admit: 2014-08-15 | Discharge: 2014-08-18 | DRG: 683 | Disposition: A | Discharge status: Home / Self Care | Payer: Medicare HMO | Attending: Endocrinology | Admitting: Endocrinology

## 2014-08-15 ENCOUNTER — Encounter (HOSPITAL_COMMUNITY): Payer: Self-pay | Admitting: Emergency Medicine

## 2014-08-15 DIAGNOSIS — E11319 Type 2 diabetes mellitus with unspecified diabetic retinopathy without macular edema: Secondary | ICD-10-CM | POA: Diagnosis present

## 2014-08-15 DIAGNOSIS — Z89511 Acquired absence of right leg below knee: Secondary | ICD-10-CM | POA: Diagnosis not present

## 2014-08-15 DIAGNOSIS — S88112A Complete traumatic amputation at level between knee and ankle, left lower leg, initial encounter: Secondary | ICD-10-CM

## 2014-08-15 DIAGNOSIS — I2510 Atherosclerotic heart disease of native coronary artery without angina pectoris: Secondary | ICD-10-CM | POA: Diagnosis present

## 2014-08-15 DIAGNOSIS — R627 Adult failure to thrive: Secondary | ICD-10-CM | POA: Diagnosis present

## 2014-08-15 DIAGNOSIS — I739 Peripheral vascular disease, unspecified: Secondary | ICD-10-CM | POA: Diagnosis present

## 2014-08-15 DIAGNOSIS — I509 Heart failure, unspecified: Secondary | ICD-10-CM | POA: Diagnosis present

## 2014-08-15 DIAGNOSIS — E785 Hyperlipidemia, unspecified: Secondary | ICD-10-CM | POA: Diagnosis present

## 2014-08-15 DIAGNOSIS — N179 Acute kidney failure, unspecified: Principal | ICD-10-CM | POA: Diagnosis present

## 2014-08-15 DIAGNOSIS — E1140 Type 2 diabetes mellitus with diabetic neuropathy, unspecified: Secondary | ICD-10-CM | POA: Diagnosis present

## 2014-08-15 DIAGNOSIS — N390 Urinary tract infection, site not specified: Secondary | ICD-10-CM | POA: Diagnosis present

## 2014-08-15 DIAGNOSIS — I69993 Ataxia following unspecified cerebrovascular disease: Secondary | ICD-10-CM

## 2014-08-15 DIAGNOSIS — E860 Dehydration: Secondary | ICD-10-CM

## 2014-08-15 DIAGNOSIS — F17210 Nicotine dependence, cigarettes, uncomplicated: Secondary | ICD-10-CM | POA: Diagnosis present

## 2014-08-15 DIAGNOSIS — I4891 Unspecified atrial fibrillation: Secondary | ICD-10-CM | POA: Diagnosis present

## 2014-08-15 DIAGNOSIS — I429 Cardiomyopathy, unspecified: Secondary | ICD-10-CM | POA: Diagnosis present

## 2014-08-15 DIAGNOSIS — IMO0002 Reserved for concepts with insufficient information to code with codable children: Secondary | ICD-10-CM | POA: Diagnosis present

## 2014-08-15 DIAGNOSIS — E1129 Type 2 diabetes mellitus with other diabetic kidney complication: Secondary | ICD-10-CM | POA: Diagnosis present

## 2014-08-15 DIAGNOSIS — I129 Hypertensive chronic kidney disease with stage 1 through stage 4 chronic kidney disease, or unspecified chronic kidney disease: Secondary | ICD-10-CM | POA: Diagnosis present

## 2014-08-15 DIAGNOSIS — E039 Hypothyroidism, unspecified: Secondary | ICD-10-CM | POA: Diagnosis present

## 2014-08-15 DIAGNOSIS — Z7901 Long term (current) use of anticoagulants: Secondary | ICD-10-CM

## 2014-08-15 DIAGNOSIS — Z8673 Personal history of transient ischemic attack (TIA), and cerebral infarction without residual deficits: Secondary | ICD-10-CM | POA: Diagnosis not present

## 2014-08-15 DIAGNOSIS — J449 Chronic obstructive pulmonary disease, unspecified: Secondary | ICD-10-CM | POA: Diagnosis present

## 2014-08-15 DIAGNOSIS — N189 Chronic kidney disease, unspecified: Secondary | ICD-10-CM | POA: Diagnosis present

## 2014-08-15 DIAGNOSIS — Z89611 Acquired absence of right leg above knee: Secondary | ICD-10-CM

## 2014-08-15 DIAGNOSIS — E1165 Type 2 diabetes mellitus with hyperglycemia: Secondary | ICD-10-CM

## 2014-08-15 DIAGNOSIS — Z951 Presence of aortocoronary bypass graft: Secondary | ICD-10-CM

## 2014-08-15 DIAGNOSIS — I10 Essential (primary) hypertension: Secondary | ICD-10-CM | POA: Diagnosis present

## 2014-08-15 DIAGNOSIS — D649 Anemia, unspecified: Secondary | ICD-10-CM | POA: Diagnosis present

## 2014-08-15 DIAGNOSIS — I6529 Occlusion and stenosis of unspecified carotid artery: Secondary | ICD-10-CM | POA: Diagnosis present

## 2014-08-15 DIAGNOSIS — I69320 Aphasia following cerebral infarction: Secondary | ICD-10-CM

## 2014-08-15 DIAGNOSIS — R112 Nausea with vomiting, unspecified: Secondary | ICD-10-CM | POA: Diagnosis present

## 2014-08-15 DIAGNOSIS — R630 Anorexia: Secondary | ICD-10-CM

## 2014-08-15 HISTORY — DX: Cerebral infarction, unspecified: I63.9

## 2014-08-15 HISTORY — DX: Personal history of other medical treatment: Z92.89

## 2014-08-15 HISTORY — DX: Gastro-esophageal reflux disease without esophagitis: K21.9

## 2014-08-15 HISTORY — DX: Type 2 diabetes mellitus without complications: E11.9

## 2014-08-15 HISTORY — DX: Depression, unspecified: F32.A

## 2014-08-15 HISTORY — DX: Personal history of other diseases of the digestive system: Z87.19

## 2014-08-15 HISTORY — DX: Major depressive disorder, single episode, unspecified: F32.9

## 2014-08-15 LAB — I-STAT TROPONIN, ED: Troponin i, poc: 0.03 ng/mL (ref 0.00–0.08)

## 2014-08-15 LAB — URINALYSIS, ROUTINE W REFLEX MICROSCOPIC
BILIRUBIN URINE: NEGATIVE
Glucose, UA: NEGATIVE mg/dL
Ketones, ur: 15 mg/dL — AB
NITRITE: POSITIVE — AB
Protein, ur: 100 mg/dL — AB
SPECIFIC GRAVITY, URINE: 1.019 (ref 1.005–1.030)
UROBILINOGEN UA: 1 mg/dL (ref 0.0–1.0)
pH: 7 (ref 5.0–8.0)

## 2014-08-15 LAB — PROTIME-INR
INR: 2.9 — ABNORMAL HIGH (ref 0.00–1.49)
Prothrombin Time: 30.6 seconds — ABNORMAL HIGH (ref 11.6–15.2)

## 2014-08-15 LAB — URINE MICROSCOPIC-ADD ON

## 2014-08-15 LAB — COMPREHENSIVE METABOLIC PANEL
ALBUMIN: 3.1 g/dL — AB (ref 3.5–5.2)
ALT: 28 U/L (ref 0–53)
AST: 35 U/L (ref 0–37)
Alkaline Phosphatase: 60 U/L (ref 39–117)
Anion gap: 17 — ABNORMAL HIGH (ref 5–15)
BUN: 103 mg/dL — ABNORMAL HIGH (ref 6–23)
CALCIUM: 10.3 mg/dL (ref 8.4–10.5)
CO2: 27 meq/L (ref 19–32)
CREATININE: 2.77 mg/dL — AB (ref 0.50–1.35)
Chloride: 94 mEq/L — ABNORMAL LOW (ref 96–112)
GFR calc Af Amer: 25 mL/min — ABNORMAL LOW (ref >90)
GFR, EST NON AFRICAN AMERICAN: 22 mL/min — AB (ref >90)
Glucose, Bld: 105 mg/dL — ABNORMAL HIGH (ref 70–99)
Potassium: 5.3 mEq/L (ref 3.7–5.3)
SODIUM: 138 meq/L (ref 137–147)
Total Bilirubin: 0.4 mg/dL (ref 0.3–1.2)
Total Protein: 8.3 g/dL (ref 6.0–8.3)

## 2014-08-15 LAB — CBC WITH DIFFERENTIAL/PLATELET
BASOS ABS: 0 10*3/uL (ref 0.0–0.1)
BASOS PCT: 0 % (ref 0–1)
Eosinophils Absolute: 0 10*3/uL (ref 0.0–0.7)
Eosinophils Relative: 0 % (ref 0–5)
HEMATOCRIT: 48.8 % (ref 39.0–52.0)
Hemoglobin: 16.9 g/dL (ref 13.0–17.0)
LYMPHS PCT: 10 % — AB (ref 12–46)
Lymphs Abs: 2.3 10*3/uL (ref 0.7–4.0)
MCH: 29.2 pg (ref 26.0–34.0)
MCHC: 34.6 g/dL (ref 30.0–36.0)
MCV: 84.3 fL (ref 78.0–100.0)
MONO ABS: 1.7 10*3/uL — AB (ref 0.1–1.0)
Monocytes Relative: 7 % (ref 3–12)
Neutro Abs: 19.5 10*3/uL — ABNORMAL HIGH (ref 1.7–7.7)
Neutrophils Relative %: 83 % — ABNORMAL HIGH (ref 43–77)
PLATELETS: 357 10*3/uL (ref 150–400)
RBC: 5.79 MIL/uL (ref 4.22–5.81)
RDW: 15.1 % (ref 11.5–15.5)
WBC: 23.5 10*3/uL — AB (ref 4.0–10.5)

## 2014-08-15 LAB — LIPASE, BLOOD: Lipase: 12 U/L (ref 11–59)

## 2014-08-15 LAB — I-STAT CG4 LACTIC ACID, ED: LACTIC ACID, VENOUS: 1.39 mmol/L (ref 0.5–2.2)

## 2014-08-15 LAB — CBG MONITORING, ED: Glucose-Capillary: 169 mg/dL — ABNORMAL HIGH (ref 70–99)

## 2014-08-15 LAB — GLUCOSE, CAPILLARY: Glucose-Capillary: 171 mg/dL — ABNORMAL HIGH (ref 70–99)

## 2014-08-15 MED ORDER — SODIUM CHLORIDE 0.9 % IV SOLN
INTRAVENOUS | Status: DC
  Administered 2014-08-15 – 2014-08-18 (×4): via INTRAVENOUS

## 2014-08-15 MED ORDER — INSULIN ASPART 100 UNIT/ML ~~LOC~~ SOLN
0.0000 [IU] | Freq: Three times a day (TID) | SUBCUTANEOUS | Status: DC
Start: 2014-08-16 — End: 2014-08-18
  Administered 2014-08-16: 3 [IU] via SUBCUTANEOUS
  Administered 2014-08-16: 5 [IU] via SUBCUTANEOUS
  Administered 2014-08-16 – 2014-08-17 (×2): 3 [IU] via SUBCUTANEOUS
  Administered 2014-08-17: 2 [IU] via SUBCUTANEOUS
  Administered 2014-08-17: 3 [IU] via SUBCUTANEOUS
  Administered 2014-08-18: 2 [IU] via SUBCUTANEOUS
  Administered 2014-08-18: 3 [IU] via SUBCUTANEOUS

## 2014-08-15 MED ORDER — INSULIN ASPART 100 UNIT/ML ~~LOC~~ SOLN: 0.0000 [IU] | Freq: Every day | SUBCUTANEOUS | Status: DC

## 2014-08-15 MED ORDER — ONDANSETRON HCL 4 MG/2ML IJ SOLN: 4.0000 mg | Freq: Four times a day (QID) | INTRAMUSCULAR | Status: DC | PRN

## 2014-08-15 MED ORDER — WARFARIN SODIUM 1 MG PO TABS
1.5000 mg | ORAL_TABLET | Freq: Once | ORAL | Status: AC
  Administered 2014-08-16: 1.5 mg via ORAL
  Filled 2014-08-15 (×2): qty 1

## 2014-08-15 MED ORDER — ONE-DAILY MULTI VITAMINS PO TABS: 1.0000 | ORAL_TABLET | Freq: Every day | ORAL | Status: DC

## 2014-08-15 MED ORDER — ATORVASTATIN CALCIUM 80 MG PO TABS
80.0000 mg | ORAL_TABLET | Freq: Every day | ORAL | Status: DC
  Administered 2014-08-15 – 2014-08-17 (×3): 80 mg via ORAL
  Filled 2014-08-15 (×4): qty 1

## 2014-08-15 MED ORDER — WARFARIN - PHARMACIST DOSING INPATIENT: Freq: Every day | Status: DC

## 2014-08-15 MED ORDER — SODIUM CHLORIDE 0.9 % IV SOLN
INTRAVENOUS | Status: DC
  Administered 2014-08-15: 16:00:00 via INTRAVENOUS

## 2014-08-15 MED ORDER — HYDROCODONE-ACETAMINOPHEN 5-325 MG PO TABS: 1.0000 | ORAL_TABLET | Freq: Four times a day (QID) | ORAL | Status: DC | PRN

## 2014-08-15 MED ORDER — ADULT MULTIVITAMIN W/MINERALS CH
1.0000 | ORAL_TABLET | Freq: Every day | ORAL | Status: DC
  Administered 2014-08-16 – 2014-08-18 (×3): 1 via ORAL
  Filled 2014-08-15 (×3): qty 1

## 2014-08-15 MED ORDER — FENOFIBRATE 160 MG PO TABS
160.0000 mg | ORAL_TABLET | Freq: Every day | ORAL | Status: DC
  Administered 2014-08-15 – 2014-08-18 (×4): 160 mg via ORAL
  Filled 2014-08-15 (×4): qty 1

## 2014-08-15 MED ORDER — SILVER SULFADIAZINE 1 % EX CREA
1.0000 "application " | TOPICAL_CREAM | Freq: Every day | CUTANEOUS | Status: DC
  Administered 2014-08-16 – 2014-08-18 (×3): 1 via TOPICAL
  Filled 2014-08-15: qty 85

## 2014-08-15 MED ORDER — DOCUSATE SODIUM 100 MG PO CAPS
100.0000 mg | ORAL_CAPSULE | Freq: Every day | ORAL | Status: DC
  Administered 2014-08-15 – 2014-08-17 (×3): 100 mg via ORAL
  Filled 2014-08-15 (×3): qty 1

## 2014-08-15 MED ORDER — INSULIN GLARGINE 100 UNIT/ML ~~LOC~~ SOLN
20.0000 [IU] | Freq: Every day | SUBCUTANEOUS | Status: DC
Start: 2014-08-15 — End: 2014-08-15

## 2014-08-15 MED ORDER — LEVOTHYROXINE SODIUM 50 MCG PO TABS
50.0000 ug | ORAL_TABLET | Freq: Every day | ORAL | Status: DC
  Administered 2014-08-16 – 2014-08-18 (×3): 50 ug via ORAL
  Filled 2014-08-15 (×4): qty 1

## 2014-08-15 MED ORDER — DEXTROSE 5 % IV SOLN
1.0000 g | Freq: Once | INTRAVENOUS | Status: AC
  Administered 2014-08-15: 1 g via INTRAVENOUS
  Filled 2014-08-15: qty 10

## 2014-08-15 MED ORDER — SODIUM CHLORIDE 0.9 % IV BOLUS (SEPSIS)
500.0000 mL | Freq: Once | INTRAVENOUS | Status: AC
  Administered 2014-08-15: 500 mL via INTRAVENOUS

## 2014-08-15 MED ORDER — CIPROFLOXACIN IN D5W 400 MG/200ML IV SOLN
400.0000 mg | INTRAVENOUS | Status: DC
  Administered 2014-08-15: 400 mg via INTRAVENOUS
  Filled 2014-08-15 (×2): qty 200

## 2014-08-15 NOTE — ED Notes (Signed)
Dr. Zackowski at bedside  

## 2014-08-15 NOTE — ED Notes (Signed)
Dr. South at bedside.  

## 2014-08-15 NOTE — ED Notes (Signed)
Attempted to call report x 1  

## 2014-08-15 NOTE — Progress Notes (Signed)
ANTICOAGULATION CONSULT NOTE - Initial Consult  Pharmacy Consult for warfarin Indication: atrial fibrillation  No Known Allergies  Vital Signs: Temp: 97.2 F (36.2 C) (10/20 1314) Temp Source: Oral (10/20 1314) BP: 107/81 mmHg (10/20 1500) Pulse Rate: 57 (10/20 1500)  Labs:  Recent Labs  08/15/14 1427  HGB 16.9  HCT 48.8  PLT 357  CREATININE 2.77*    The CrCl is unknown because both a height and weight (above a minimum accepted value) are required for this calculation.   Medical History: Past Medical History  Diagnosis Date  . Diabetes mellitus   . Anemia   . CAD (coronary artery disease)   . History of GI bleed   . PAD (peripheral artery disease)   . Atrial fibrillation   . Hypothyroidism   . Hyperlipidemia   . Hypertension   . History of hiatal hernia   . Chronic kidney disease     chronic renal insufficiency  . COPD (chronic obstructive pulmonary disease)   . Dehiscence of operative wound     Medications:  PTA warfarin 3 mg MF 1.5 all other days.  Assessment: [name] w/ n/v, and decreased appetite.  Hx of atrial fibrillation treated with warfarin. INR 2.9. Platelets 357, WBC 23.5.  Hct/Hgb WNL. Continue warfarin inpatient.  PTA dose: 3 mg on Mon/Fri, 1.5 mg all other days  Goal of Therapy:  Goal INR 2-3 Monitor platelets by anticoagulation protocol: yes   Plan:  Warfarin 1.5 mg po x1 Daily INR  Marisue BrooklynGazda, Nicholas 08/15/2014,6:07 PM  Note has been reviewed. I agree with the assessment and plan.    Agapito GamesAlison Destanie Tibbetts, PharmD, BCPS Clinical Pharmacist Pager: [phone] 08/15/2014 7:06 PM
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
## 2014-08-15 NOTE — Progress Notes (Addendum)
Pt and family (sons- Orvilla Fusommy and Trey PaulaJeff) given orientation to unit, room and routine. Information packet given to patient/family and safety video watched.  Admission INP armband ID verified with patient and in place. Side rails in place, fall risk assessment complete with patient verbalizing understanding of risks associated with falls. Pt verbalizes an understanding of how to use the call bell and to call for help before getting out of bed.   Will cont to monitor and assist as needed.  Gilman Schmidtembrina, Graeson Nouri J, RN
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
## 2014-08-15 NOTE — ED Notes (Signed)
Per EMS, pt comes from home with c/o n/v and decrease appetite. Pt A&OX4,NAD noted. Pt denies chest pain, weakness or SOB. Pt has not had BM in 1 week. VSS: BP 104/70, P 81, R20, 96% O2 rm air, cbg 69.
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
## 2014-08-16 LAB — BASIC METABOLIC PANEL
Anion gap: 14 (ref 5–15)
BUN: 83 mg/dL — AB (ref 6–23)
CALCIUM: 8.6 mg/dL (ref 8.4–10.5)
CO2: 23 mEq/L (ref 19–32)
CREATININE: 1.87 mg/dL — AB (ref 0.50–1.35)
Chloride: 97 mEq/L (ref 96–112)
GFR calc Af Amer: 41 mL/min — ABNORMAL LOW (ref >90)
GFR, EST NON AFRICAN AMERICAN: 35 mL/min — AB (ref >90)
GLUCOSE: 155 mg/dL — AB (ref 70–99)
Potassium: 4 mEq/L (ref 3.7–5.3)
Sodium: 134 mEq/L — ABNORMAL LOW (ref 137–147)

## 2014-08-16 LAB — CBC
HCT: 41.9 % (ref 39.0–52.0)
Hemoglobin: 14.3 g/dL (ref 13.0–17.0)
MCH: 29.2 pg (ref 26.0–34.0)
MCHC: 34.1 g/dL (ref 30.0–36.0)
MCV: 85.5 fL (ref 78.0–100.0)
PLATELETS: 285 10*3/uL (ref 150–400)
RBC: 4.9 MIL/uL (ref 4.22–5.81)
RDW: 15 % (ref 11.5–15.5)
WBC: 20.3 10*3/uL — ABNORMAL HIGH (ref 4.0–10.5)

## 2014-08-16 LAB — GLUCOSE, CAPILLARY
GLUCOSE-CAPILLARY: 211 mg/dL — AB (ref 70–99)
GLUCOSE-CAPILLARY: 196 mg/dL — AB (ref 70–99)
Glucose-Capillary: 160 mg/dL — ABNORMAL HIGH (ref 70–99)
Glucose-Capillary: 157 mg/dL — ABNORMAL HIGH (ref 70–99)

## 2014-08-16 LAB — PROTIME-INR
INR: 3.09 — AB (ref 0.00–1.49)
Prothrombin Time: 32.1 seconds — ABNORMAL HIGH (ref 11.6–15.2)

## 2014-08-16 LAB — HEMOGLOBIN A1C
Hgb A1c MFr Bld: 8.5 % — ABNORMAL HIGH (ref <5.7)
Mean Plasma Glucose: 197 mg/dL — ABNORMAL HIGH (ref <117)

## 2014-08-16 MED ORDER — GLUCERNA SHAKE PO LIQD
237.0000 mL | Freq: Two times a day (BID) | ORAL | Status: DC
  Administered 2014-08-16 – 2014-08-18 (×5): 237 mL via ORAL

## 2014-08-16 MED ORDER — WARFARIN 0.5 MG HALF TABLET
0.5000 mg | ORAL_TABLET | Freq: Once | ORAL | Status: AC
  Administered 2014-08-16: 0.5 mg via ORAL
  Filled 2014-08-16: qty 1

## 2014-08-16 MED ORDER — CIPROFLOXACIN IN D5W 400 MG/200ML IV SOLN
400.0000 mg | Freq: Two times a day (BID) | INTRAVENOUS | Status: DC
  Administered 2014-08-16 – 2014-08-17 (×4): 400 mg via INTRAVENOUS
  Filled 2014-08-16 (×6): qty 200

## 2014-08-16 MED ORDER — INSULIN GLARGINE 100 UNIT/ML ~~LOC~~ SOLN
15.0000 [IU] | Freq: Every day | SUBCUTANEOUS | Status: DC
  Administered 2014-08-16 – 2014-08-18 (×3): 15 [IU] via SUBCUTANEOUS
  Filled 2014-08-16 (×3): qty 0.15
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
## 2014-08-16 NOTE — Progress Notes (Signed)
Utilization review completed.  

## 2014-08-16 NOTE — Progress Notes (Signed)
Subjective: Doing a lot better already. Ate liquid diet with no issues. Some constipation. No pain   Objective: Vital signs in last 24 hours: Temp:  [97.2 F (36.2 C)-98.3 F (36.8 C)] 98.2 F (36.8 C) (10/21 0547) Pulse Rate:  [54-91] 77 (10/21 0547) Resp:  [15-23] 16 (10/21 0547) BP: (107-147)/(37-87) 114/47 mmHg (10/21 0547) SpO2:  [91 %-98 %] 91 % (10/21 0547)  Intake/Output from previous day: 10/20 0701 - 10/21 0700 In: -  Out: 1350 [Urine:1350] Intake/Output this shift:    Sitting up in no distress. Clearly stronger. Lungs reduced. Ht IR IR abd sl distended. Healed stumps. Wounds not inspected today. Awake. mentating better  Lab Results   Recent Labs  08/15/14 1427 08/16/14 0515  WBC 23.5* 20.3*  RBC 5.79 4.90  HGB 16.9 14.3  HCT 48.8 41.9  MCV 84.3 85.5  MCH 29.2 29.2  RDW 15.1 15.0  PLT 357 285    Recent Labs  08/15/14 1427 08/16/14 0515  NA 138 134*  K 5.3 4.0  CL 94* 97  CO2 27 23  GLUCOSE 105* 155*  BUN 103* 83*  CREATININE 2.77* 1.87*  CALCIUM 10.3 8.6    Studies/Results: Ct Abdomen Pelvis Wo Contrast  08/15/2014   CLINICAL DATA:  Nausea, vomiting decreased appetite  EXAM: CT ABDOMEN AND PELVIS WITHOUT CONTRAST  TECHNIQUE: Multidetector CT imaging of the abdomen and pelvis was performed following the standard protocol without IV contrast.  COMPARISON:  01/05/2008  FINDINGS: Lung bases are unremarkable. Atherosclerotic calcifications of coronary arteries. Extensive atherosclerotic calcifications of abdominal aorta, SMA, renal artery, splenic artery and bilateral iliac arteries.  Unenhanced liver shows no biliary ductal dilatation. Multiple small layering calcified gallstones are noted within gallbladder the largest measures 4 mm.  Unenhanced pancreas, spleen and adrenal glands are unremarkable. Unenhanced kidneys are symmetrical in size. Bilateral renal vascular calcifications are noted. No definite renal calculi. No hydronephrosis or hydroureter. No  ureteral calculi are identified.  There is no pericecal inflammation. Normal appendix. The terminal ileum is unremarkable.  No small bowel obstruction.  No ascites or free air.  No adenopathy.  Moderate stool noted within rectum. The rectum measures 6.2 cm in diameter suspicious for mild fecal impaction. There is a Foley catheter in a decompressed urinary bladder.  Sagittal images of the spine shows diffuse osteopenia. Mild degenerative changes thoracolumbar spine. Degenerative changes bilateral SI joints. Coronal images shows degenerative changes bilateral hip joints.  IMPRESSION: 1. Extensive atherosclerotic vascular calcifications. 2. Small layering calcified gallstones are noted within gallbladder. 3. Normal appendix.  No pericecal inflammation. 4. There is a Foley catheter in a decompressed urinary bladder. 5. There is moderate distended rectum with stool measures 6.2 cm in diameter suspicious for mild fecal impaction.   Electronically Signed   By: Natasha MeadLiviu  Pop M.D.   On: 08/15/2014 17:30   Dg Chest 2 View  08/15/2014   CLINICAL DATA:  Nausea and vomiting and decreased appetite for several days.  EXAM: CHEST  2 VIEW  COMPARISON:  PA and lateral chest 02/21/2013.  FINDINGS: The lungs are clear. Heart size is normal. No pneumothorax or pleural effusion. The patient is status post CABG.  IMPRESSION: No acute disease.   Electronically Signed   By: Drusilla Kannerhomas  Dalessio M.D.   On: 08/15/2014 16:53   Ct Head Wo Contrast  08/15/2014   CLINICAL DATA:  Blurry vision  EXAM: CT HEAD WITHOUT CONTRAST  TECHNIQUE: Contiguous axial images were obtained from the base of the skull through the vertex without  intravenous contrast.  COMPARISON:  02/21/2013  FINDINGS: No skull fracture is noted. Paranasal sinuses and mastoid air cells are unremarkable. Again noted remote infarct in left temporal and left frontal lobe. No intracranial hemorrhage, mass effect or midline shift. Stable cerebral atrophy. Stable periventricular and  patchy subcortical chronic white matter disease. No definite acute cortical infarction. No mass lesion is noted on this unenhanced scan.  IMPRESSION: No acute intracranial abnormality. Stable remote infarct in left temporal and left frontal lobe. Stable atrophy and chronic white matter disease.   Electronically Signed   By: Natasha MeadLiviu  Pop M.D.   On: 08/15/2014 17:24    Scheduled Meds: . atorvastatin  80 mg Oral q1800  . ciprofloxacin  400 mg Intravenous Q24H  . docusate sodium  100 mg Oral Daily  . fenofibrate  160 mg Oral Daily  . insulin aspart  0-15 Units Subcutaneous TID WC  . insulin aspart  0-5 Units Subcutaneous QHS  . levothyroxine  50 mcg Oral QAC breakfast  . multivitamin with minerals  1 tablet Oral Daily  . silver sulfADIAZINE  1 application Topical Daily  . Warfarin - Pharmacist Dosing Inpatient   Does not apply q1800   Continuous Infusions: . sodium chloride 150 mL/hr at 08/16/14 0700   PRN Meds:HYDROcodone-acetaminophen, ondansetron (ZOFRAN) IV  Assessment/Plan:  Renal failure (ARF), acute on chronic: significantly better overnight. Cr 1.77  Active Problems:  UTI: Rx cipro for this , no fever. 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 Hx embolic CVA's  CVA, old, aphasia: stable  Ataxia S/P CVA; still right sided weakness  Nausea & vomiting: improved, advance diet Carotid artery narrowings: no Rx planned  COPD (chronic obstructive pulmonary disease). Add O2, still smoking. At least moderate  Adult failure to thrive: more positive today  Gallstones: not acutely inflammed  FULL CODE STATUS    LOS: 1 day    Agustine Rossitto [name] 08/16/2014, 8:32 AM

## 2014-08-16 NOTE — Progress Notes (Signed)
ANTICOAGULATION CONSULT NOTE - Follow Up Consult  Pharmacy Consult for coumadin Indication: atrial fibrillation  No Known Allergies   Vital Signs: Temp: 98.2 F (36.8 C) (10/21 0547) Temp Source: Oral (10/21 0547) BP: 114/47 mmHg (10/21 0547) Pulse Rate: 77 (10/21 0547)  Labs:  Recent Labs  08/15/14 1427 08/15/14 1754 08/16/14 0515  HGB 16.9  --  14.3  HCT 48.8  --  41.9  PLT 357  --  285  LABPROT  --  30.6*  --   INR  --  2.90*  --   CREATININE 2.77*  --  1.87*    The CrCl is unknown because both a height and weight (above a minimum accepted value) are required for this calculation.   Assessment: Patient is a 68 y.o M on coumadin for Afib.  INR is therapeutic at 3.09 today but is at upper end of therapeutic range.  No bleeding documented.   Goal of Therapy:  INR 2-3    Plan:  1) coumadin 0.5mg  PO x1 today 2)  Monitor INR closely for drug-drug interactions since he's on abx 3)  change ciprofloxacin to 400mg  IV q12h (d/t improving renal function) 4)  f/u cultures  Abdur Hoglund P 08/16/2014,9:56 AM

## 2014-08-17 LAB — CBC
HCT: 43.3 % (ref 39.0–52.0)
Hemoglobin: 14.5 g/dL (ref 13.0–17.0)
MCH: 28.8 pg (ref 26.0–34.0)
MCHC: 33.5 g/dL (ref 30.0–36.0)
MCV: 86.1 fL (ref 78.0–100.0)
PLATELETS: 269 10*3/uL (ref 150–400)
RBC: 5.03 MIL/uL (ref 4.22–5.81)
RDW: 14.9 % (ref 11.5–15.5)
WBC: 21.9 10*3/uL — ABNORMAL HIGH (ref 4.0–10.5)

## 2014-08-17 LAB — BASIC METABOLIC PANEL
ANION GAP: 13 (ref 5–15)
BUN: 39 mg/dL — ABNORMAL HIGH (ref 6–23)
CHLORIDE: 102 meq/L (ref 96–112)
CO2: 20 mEq/L (ref 19–32)
Calcium: 8.7 mg/dL (ref 8.4–10.5)
Creatinine, Ser: 1.27 mg/dL (ref 0.50–1.35)
GFR calc non Af Amer: 56 mL/min — ABNORMAL LOW (ref >90)
GFR, EST AFRICAN AMERICAN: 65 mL/min — AB (ref >90)
Glucose, Bld: 203 mg/dL — ABNORMAL HIGH (ref 70–99)
POTASSIUM: 4.2 meq/L (ref 3.7–5.3)
SODIUM: 135 meq/L — AB (ref 137–147)

## 2014-08-17 LAB — GLUCOSE, CAPILLARY
Glucose-Capillary: 195 mg/dL — ABNORMAL HIGH (ref 70–99)
Glucose-Capillary: 128 mg/dL — ABNORMAL HIGH (ref 70–99)
Glucose-Capillary: 175 mg/dL — ABNORMAL HIGH (ref 70–99)
Glucose-Capillary: 182 mg/dL — ABNORMAL HIGH (ref 70–99)

## 2014-08-17 LAB — PROTIME-INR
INR: 3.05 — AB (ref 0.00–1.49)
PROTHROMBIN TIME: 31.8 s — AB (ref 11.6–15.2)

## 2014-08-17 MED ORDER — WARFARIN 0.5 MG HALF TABLET
0.5000 mg | ORAL_TABLET | Freq: Once | ORAL | Status: AC
  Administered 2014-08-17: 0.5 mg via ORAL
  Filled 2014-08-17: qty 1
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
## 2014-08-17 NOTE — Progress Notes (Signed)
Subjective: Feeling better. Slept thru breakfast, eating fair at lunch. Bowels better   Objective: Vital signs in last 24 hours: Temp:  [98 F (36.7 C)-98.4 F (36.9 C)] 98.1 F (36.7 C) (10/22 1000) Pulse Rate:  [46-110] 68 (10/22 1000) Resp:  [16-18] 17 (10/22 1000) BP: (104-160)/(39-78) 145/78 mmHg (10/22 1000) SpO2:  [96 %-100 %] 98 % (10/22 1000)  Intake/Output from previous day: 10/21 0701 - 10/22 0700 In: 860 [P.O.:360; I.V.:500] Out: 2853 [Urine:2850; Stool:3] Intake/Output this shift: Total I/O In: 120 [P.O.:120] Out: 800 [Urine:800]  In no distress, facial weakness unchanged, lungs distant. Ht IR IR abd soft NT healed amp Awake, mentating well  Lab Results   Recent Labs  08/16/14 0515 08/17/14 0727  WBC 20.3* 21.9*  RBC 4.90 5.03  HGB 14.3 14.5  HCT 41.9 43.3  MCV 85.5 86.1  MCH 29.2 28.8  RDW 15.0 14.9  PLT 285 269    Recent Labs  08/16/14 0515 08/17/14 0727  NA 134* 135*  K 4.0 4.2  CL 97 102  CO2 23 20  GLUCOSE 155* 203*  BUN 83* 39*  CREATININE 1.87* 1.27  CALCIUM 8.6 8.7    Studies/Results: Ct Abdomen Pelvis Wo Contrast  08/15/2014   CLINICAL DATA:  Nausea, vomiting decreased appetite  EXAM: CT ABDOMEN AND PELVIS WITHOUT CONTRAST  TECHNIQUE: Multidetector CT imaging of the abdomen and pelvis was performed following the standard protocol without IV contrast.  COMPARISON:  01/05/2008  FINDINGS: Lung bases are unremarkable. Atherosclerotic calcifications of coronary arteries. Extensive atherosclerotic calcifications of abdominal aorta, SMA, renal artery, splenic artery and bilateral iliac arteries.  Unenhanced liver shows no biliary ductal dilatation. Multiple small layering calcified gallstones are noted within gallbladder the largest measures 4 mm.  Unenhanced pancreas, spleen and adrenal glands are unremarkable. Unenhanced kidneys are symmetrical in size. Bilateral renal vascular calcifications are noted. No definite renal calculi. No  hydronephrosis or hydroureter. No ureteral calculi are identified.  There is no pericecal inflammation. Normal appendix. The terminal ileum is unremarkable.  No small bowel obstruction.  No ascites or free air.  No adenopathy.  Moderate stool noted within rectum. The rectum measures 6.2 cm in diameter suspicious for mild fecal impaction. There is a Foley catheter in a decompressed urinary bladder.  Sagittal images of the spine shows diffuse osteopenia. Mild degenerative changes thoracolumbar spine. Degenerative changes bilateral SI joints. Coronal images shows degenerative changes bilateral hip joints.  IMPRESSION: 1. Extensive atherosclerotic vascular calcifications. 2. Small layering calcified gallstones are noted within gallbladder. 3. Normal appendix.  No pericecal inflammation. 4. There is a Foley catheter in a decompressed urinary bladder. 5. There is moderate distended rectum with stool measures 6.2 cm in diameter suspicious for mild fecal impaction.   Electronically Signed   By: Natasha MeadLiviu  Pop M.D.   On: 08/15/2014 17:30   Dg Chest 2 View  08/15/2014   CLINICAL DATA:  Nausea and vomiting and decreased appetite for several days.  EXAM: CHEST  2 VIEW  COMPARISON:  PA and lateral chest 02/21/2013.  FINDINGS: The lungs are clear. Heart size is normal. No pneumothorax or pleural effusion. The patient is status post CABG.  IMPRESSION: No acute disease.   Electronically Signed   By: Drusilla Kannerhomas  Dalessio M.D.   On: 08/15/2014 16:53   Ct Head Wo Contrast  08/15/2014   CLINICAL DATA:  Blurry vision  EXAM: CT HEAD WITHOUT CONTRAST  TECHNIQUE: Contiguous axial images were obtained from the base of the skull through the vertex without intravenous  contrast.  COMPARISON:  02/21/2013  FINDINGS: No skull fracture is noted. Paranasal sinuses and mastoid air cells are unremarkable. Again noted remote infarct in left temporal and left frontal lobe. No intracranial hemorrhage, mass effect or midline shift. Stable cerebral  atrophy. Stable periventricular and patchy subcortical chronic white matter disease. No definite acute cortical infarction. No mass lesion is noted on this unenhanced scan.  IMPRESSION: No acute intracranial abnormality. Stable remote infarct in left temporal and left frontal lobe. Stable atrophy and chronic white matter disease.   Electronically Signed   By: Natasha MeadLiviu  Pop M.D.   On: 08/15/2014 17:24    Scheduled Meds: . atorvastatin  80 mg Oral q1800  . ciprofloxacin  400 mg Intravenous Q12H  . feeding supplement (GLUCERNA SHAKE)  237 mL Oral BID BM  . fenofibrate  160 mg Oral Daily  . insulin aspart  0-15 Units Subcutaneous TID WC  . insulin aspart  0-5 Units Subcutaneous QHS  . insulin glargine  15 Units Subcutaneous Daily  . levothyroxine  50 mcg Oral QAC breakfast  . multivitamin with minerals  1 tablet Oral Daily  . silver sulfADIAZINE  1 application Topical Daily  . warfarin  0.5 mg Oral ONCE-1800  . Warfarin - Pharmacist Dosing Inpatient   Does not apply q1800   Continuous Infusions: . sodium chloride 100 mL/hr at 08/16/14 1244   PRN Meds:HYDROcodone-acetaminophen, ondansetron (ZOFRAN) IV  Assessment/Plan:  Renal failure (ARF), acute on chronic: significantly better overnight again, now 1.27 Active Problems:  UTI: Rx cipro for this , no fever. 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 Hx embolic CVA's  CVA, old, aphasia: stable  Ataxia S/P CVA; still right sided weakness  Nausea & vomiting: improved, advance diet  Carotid artery narrowings: no Rx planned  COPD (chronic obstructive pulmonary disease). Add O2, still  smoking. At least moderate  Adult failure to thrive: more positive today  Gallstones: not acutely inflammed  FULL CODE STATUS    LOS: 2 days   Almir Botts [name] 08/17/2014, 2:49 PM

## 2014-08-17 NOTE — Clinical Social Work Note (Addendum)
CSW talked briefly with patient and his son Baldemar Lenisommy Halbert regarding discharge plans. Per son, patient will discharge home with he is patient's caregiver. Son requested that CSW assure patient of this plan as someone told patient earlier today that he was going to a facility.. CSW talked with patient and assured him that he will be going home his son is there to provide 24/7 care.  Patient was visibly relieved after talking with CSW. Son expressed thanks to CSW for visit. CSW signing off, however please reconsult if any additional services needed.  Genelle BalVanessa Dontasia Miranda, MSW, LCSW [phone]

## 2014-08-17 NOTE — Progress Notes (Signed)
ANTICOAGULATION CONSULT NOTE - Follow Up Consult  Pharmacy Consult for coumadin Indication: atrial fibrillation  No Known Allergies  Vital Signs: Temp: 98 F (36.7 C) (10/22 0612) Temp Source: Oral (10/22 0612) BP: 118/42 mmHg (10/22 0612) Pulse Rate: 75 (10/22 0612)  Labs:  Recent Labs  08/15/14 1427 08/15/14 1754 08/16/14 0515 08/16/14 1110 08/17/14 0727  HGB 16.9  --  14.3  --  14.5  HCT 48.8  --  41.9  --  43.3  PLT 357  --  285  --  269  LABPROT  --  30.6*  --  32.1* 31.8*  INR  --  2.90*  --  3.09* 3.05*  CREATININE 2.77*  --  1.87*  --  1.27    The CrCl is unknown because both a height and weight (above a minimum accepted value) are required for this calculation.  Assessment: Patient is a 68 y.o M on coumadin for Afib.  INR remains stable at 3.05 with lower coumadin dose given yesterday.  No bleeding documented.  Goal of Therapy:  INR 2-3    Plan:  1) repeat coumadin 0.5mg  PO x1 today   Dorance Spink P 08/17/2014,8:58 AM

## 2014-08-18 LAB — GLUCOSE, CAPILLARY
GLUCOSE-CAPILLARY: 138 mg/dL — AB (ref 70–99)
Glucose-Capillary: 171 mg/dL — ABNORMAL HIGH (ref 70–99)

## 2014-08-18 LAB — PROTIME-INR
INR: 2.68 — ABNORMAL HIGH (ref 0.00–1.49)
PROTHROMBIN TIME: 28.7 s — AB (ref 11.6–15.2)

## 2014-08-18 LAB — CBC
HCT: 43.7 % (ref 39.0–52.0)
HEMOGLOBIN: 14.7 g/dL (ref 13.0–17.0)
MCH: 29.3 pg (ref 26.0–34.0)
MCHC: 33.6 g/dL (ref 30.0–36.0)
MCV: 87.1 fL (ref 78.0–100.0)
Platelets: 247 10*3/uL (ref 150–400)
RBC: 5.02 MIL/uL (ref 4.22–5.81)
RDW: 15 % (ref 11.5–15.5)
WBC: 15.5 10*3/uL — ABNORMAL HIGH (ref 4.0–10.5)

## 2014-08-18 LAB — BASIC METABOLIC PANEL
Anion gap: 10 (ref 5–15)
BUN: 24 mg/dL — ABNORMAL HIGH (ref 6–23)
CHLORIDE: 101 meq/L (ref 96–112)
CO2: 25 mEq/L (ref 19–32)
CREATININE: 1.1 mg/dL (ref 0.50–1.35)
Calcium: 8.7 mg/dL (ref 8.4–10.5)
GFR calc Af Amer: 77 mL/min — ABNORMAL LOW (ref >90)
GFR, EST NON AFRICAN AMERICAN: 67 mL/min — AB (ref >90)
Glucose, Bld: 140 mg/dL — ABNORMAL HIGH (ref 70–99)
Potassium: 4.3 mEq/L (ref 3.7–5.3)
Sodium: 136 mEq/L — ABNORMAL LOW (ref 137–147)

## 2014-08-18 MED ORDER — CIPROFLOXACIN HCL 500 MG PO TABS
500.0000 mg | ORAL_TABLET | Freq: Two times a day (BID) | ORAL | Status: DC
  Administered 2014-08-18: 500 mg via ORAL
  Filled 2014-08-18 (×3): qty 1

## 2014-08-18 MED ORDER — CIPROFLOXACIN HCL 500 MG PO TABS: 500.0000 mg | ORAL_TABLET | Freq: Two times a day (BID) | ORAL | Status: DC

## 2014-08-18 MED ORDER — WARFARIN SODIUM 2 MG PO TABS
2.0000 mg | ORAL_TABLET | Freq: Once | ORAL | Status: DC
Start: 2014-08-18 — End: 2014-08-18
  Filled 2014-08-18: qty 1

## 2014-08-18 MED ORDER — SILVER SULFADIAZINE 1 % EX CREA: 1.0000 "application " | TOPICAL_CREAM | Freq: Every day | CUTANEOUS | Status: AC

## 2014-08-18 NOTE — Progress Notes (Signed)
Patient Discharge: Disposition: Patient discharged home with foley catheter intact, secured, and draining yellow color urine. Education: Patient educated on discharge instructions, medications, prescriptions, diet. IV: Discontinued IV before discharge. Transportation: Patient transported in w/c accompanied by the son and staff. Belongings: Patient took all his belongings with him.
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
## 2014-08-18 NOTE — Progress Notes (Addendum)
ANTICOAGULATION & ANTIBIOTIC CONSULT NOTE - Follow Up Consult  Pharmacy Consult for Warfarin + Cipro Indication: atrial fibrillation  No Known Allergies  Vital Signs: Temp: 97.6 F (36.4 C) (10/23 0500) Temp Source: Oral (10/23 0500) BP: 115/38 mmHg (10/23 0500) Pulse Rate: 94 (10/23 0500)  Labs:  Recent Labs  08/16/14 0515 08/16/14 1110 08/17/14 0727 08/18/14 0438  HGB 14.3  --  14.5 14.7  HCT 41.9  --  43.3 43.7  PLT 285  --  269 247  LABPROT  --  32.1* 31.8* 28.7*  INR  --  3.09* 3.05* 2.68*  CREATININE 1.87*  --  1.27 1.10    The CrCl is unknown because both a height and weight (above a minimum accepted value) are required for this calculation.  Assessment: [age] YOM who continues on warfarin for hx Afib with a therapeutic INR this morning (INR 2.68 << 3.05, goal of 2-3). The patient was noted to start Ciprofloxacin on 10/20 which is known to increase warfarin sensitivity. The patient has been requiring lower doses than was on previously. CBC wnl and stable - no overt s/sx of bleeding noted  PTA dose was 1.5 mg daily EXCEPT for 3 mg on Mon/Fri  The patient continues on Cipro IV however is tolerating oral meds/diet and clinically improving - will change to po today as the patient meets criteria per P&T protocol (listed below)  These criteria include:  Patient being treated for a respiratory tract infection, urinary tract infection, cellulitis or clostridium difficile associated diarrhea if on metronidazole  The patient is not neutropenic and does not exhibit a GI malabsorption state  The patient is eating (either orally or via tube) and/or has been taking other orally administered medications for a least 24 hours  The patient is improving clinically and has a Tmax < 100.5  Goal of Therapy:  INR 2-3    Plan:  1. Warfarin 2 mg x 1 dose at 1800 today 2. Adjust Ciprofloxacin to 500 mg bid 3. Will continue to monitor for any signs/symptoms of bleeding and will  follow up with PT/INR in the a.m.  4. Will continue to follow renal function, culture results, LOT, and antibiotic de-escalation plans   Georgina PillionElizabeth Myleka Moncure, PharmD, BCPS Clinical Pharmacist Pager: [phone] 08/18/2014 10:12 AM

## 2014-08-18 NOTE — Discharge Instructions (Signed)
Keep up on fluids fairly well. Several of her medicines are on hold. Refer to list. Continue daily dressing changes Continue Cipro for 5 more days

## 2014-08-20 LAB — URINE CULTURE: Colony Count: >=100000

## 2014-08-21 LAB — CULTURE, BLOOD (ROUTINE X 2)
CULTURE: NO GROWTH
Culture: NO GROWTH

## 2015-07-23 ENCOUNTER — Telehealth: Payer: Self-pay

## 2015-07-23 NOTE — Telephone Encounter (Signed)
Phone call from pt's son.  Reported pt's (L) ring finger has turned black from the tip to just past the 1st knuckle, over the past week.  Stated it is getting worse, but denied any open sore or drainage.  Described the finger as "looking rotten."  Reported the pt. has "given-up."  Stated he is not able to feed self, and has gotten weaker.  The son reported he feeds him, and gives him Ensure twice/ day.  Hx. bilat. lower extremity amputations.  Stated Home Health has been seeing him for breakdown on his bottom.  Reported "he is bed-bound", and "can't sit-up, or he will pass-out", and doesn't think he can come to the office.  Is requesting to admit the pt. to the hospital.  Advised the pt. would need to go to the ER, if he can't come to the office for evaluation, as pt. hasn't been evaluated for over a year.  The son verb. understanding.  Stated he has a Higher education careers adviser coming at 7:00 AM, tomorrow, and will wait on the nurse to see the pt., and help with the decision to send to the hospital.

## 2015-07-24 ENCOUNTER — Inpatient Hospital Stay (HOSPITAL_COMMUNITY)
Admission: EM | Admit: 2015-07-24 | Discharge: 2015-08-07 | DRG: 853 | Disposition: A | Discharge status: Hospice | Payer: Commercial Managed Care - HMO | Attending: Family Medicine | Admitting: Family Medicine

## 2015-07-24 ENCOUNTER — Emergency Department (HOSPITAL_COMMUNITY): Payer: Commercial Managed Care - HMO

## 2015-07-24 ENCOUNTER — Encounter (HOSPITAL_COMMUNITY): Payer: Self-pay

## 2015-07-24 DIAGNOSIS — Z89611 Acquired absence of right leg above knee: Secondary | ICD-10-CM | POA: Diagnosis not present

## 2015-07-24 DIAGNOSIS — K254 Chronic or unspecified gastric ulcer with hemorrhage: Secondary | ICD-10-CM | POA: Diagnosis not present

## 2015-07-24 DIAGNOSIS — A419 Sepsis, unspecified organism: Secondary | ICD-10-CM | POA: Diagnosis present

## 2015-07-24 DIAGNOSIS — F17210 Nicotine dependence, cigarettes, uncomplicated: Secondary | ICD-10-CM | POA: Diagnosis present

## 2015-07-24 DIAGNOSIS — I4891 Unspecified atrial fibrillation: Secondary | ICD-10-CM | POA: Diagnosis not present

## 2015-07-24 DIAGNOSIS — Z794 Long term (current) use of insulin: Secondary | ICD-10-CM

## 2015-07-24 DIAGNOSIS — N5089 Other specified disorders of the male genital organs: Secondary | ICD-10-CM | POA: Diagnosis not present

## 2015-07-24 DIAGNOSIS — L0390 Cellulitis, unspecified: Secondary | ICD-10-CM | POA: Diagnosis present

## 2015-07-24 DIAGNOSIS — Z79899 Other long term (current) drug therapy: Secondary | ICD-10-CM | POA: Diagnosis not present

## 2015-07-24 DIAGNOSIS — R32 Unspecified urinary incontinence: Secondary | ICD-10-CM | POA: Diagnosis present

## 2015-07-24 DIAGNOSIS — E872 Acidosis: Secondary | ICD-10-CM | POA: Diagnosis not present

## 2015-07-24 DIAGNOSIS — Z7189 Other specified counseling: Secondary | ICD-10-CM | POA: Insufficient documentation

## 2015-07-24 DIAGNOSIS — N182 Chronic kidney disease, stage 2 (mild): Secondary | ICD-10-CM | POA: Diagnosis present

## 2015-07-24 DIAGNOSIS — I214 Non-ST elevation (NSTEMI) myocardial infarction: Secondary | ICD-10-CM | POA: Diagnosis not present

## 2015-07-24 DIAGNOSIS — L8992 Pressure ulcer of unspecified site, stage 2: Secondary | ICD-10-CM | POA: Insufficient documentation

## 2015-07-24 DIAGNOSIS — E1152 Type 2 diabetes mellitus with diabetic peripheral angiopathy with gangrene: Secondary | ICD-10-CM | POA: Diagnosis present

## 2015-07-24 DIAGNOSIS — I9589 Other hypotension: Secondary | ICD-10-CM | POA: Diagnosis not present

## 2015-07-24 DIAGNOSIS — E860 Dehydration: Secondary | ICD-10-CM | POA: Diagnosis present

## 2015-07-24 DIAGNOSIS — I96 Gangrene, not elsewhere classified: Secondary | ICD-10-CM | POA: Insufficient documentation

## 2015-07-24 DIAGNOSIS — T45515A Adverse effect of anticoagulants, initial encounter: Secondary | ICD-10-CM | POA: Diagnosis not present

## 2015-07-24 DIAGNOSIS — R579 Shock, unspecified: Secondary | ICD-10-CM | POA: Diagnosis not present

## 2015-07-24 DIAGNOSIS — E1122 Type 2 diabetes mellitus with diabetic chronic kidney disease: Secondary | ICD-10-CM | POA: Diagnosis present

## 2015-07-24 DIAGNOSIS — Z89512 Acquired absence of left leg below knee: Secondary | ICD-10-CM | POA: Diagnosis not present

## 2015-07-24 DIAGNOSIS — E039 Hypothyroidism, unspecified: Secondary | ICD-10-CM | POA: Diagnosis present

## 2015-07-24 DIAGNOSIS — Z7901 Long term (current) use of anticoagulants: Secondary | ICD-10-CM | POA: Diagnosis not present

## 2015-07-24 DIAGNOSIS — Z66 Do not resuscitate: Secondary | ICD-10-CM | POA: Diagnosis present

## 2015-07-24 DIAGNOSIS — Z452 Encounter for adjustment and management of vascular access device: Secondary | ICD-10-CM

## 2015-07-24 DIAGNOSIS — K921 Melena: Secondary | ICD-10-CM | POA: Diagnosis not present

## 2015-07-24 DIAGNOSIS — D689 Coagulation defect, unspecified: Secondary | ICD-10-CM | POA: Insufficient documentation

## 2015-07-24 DIAGNOSIS — K219 Gastro-esophageal reflux disease without esophagitis: Secondary | ICD-10-CM | POA: Diagnosis present

## 2015-07-24 DIAGNOSIS — Z13810 Encounter for screening for upper gastrointestinal disorder: Secondary | ICD-10-CM

## 2015-07-24 DIAGNOSIS — L89152 Pressure ulcer of sacral region, stage 2: Secondary | ICD-10-CM | POA: Diagnosis present

## 2015-07-24 DIAGNOSIS — F05 Delirium due to known physiological condition: Secondary | ICD-10-CM | POA: Insufficient documentation

## 2015-07-24 DIAGNOSIS — R627 Adult failure to thrive: Secondary | ICD-10-CM | POA: Diagnosis present

## 2015-07-24 DIAGNOSIS — R001 Bradycardia, unspecified: Secondary | ICD-10-CM | POA: Diagnosis not present

## 2015-07-24 DIAGNOSIS — F0390 Unspecified dementia without behavioral disturbance: Secondary | ICD-10-CM | POA: Diagnosis present

## 2015-07-24 DIAGNOSIS — I4510 Unspecified right bundle-branch block: Secondary | ICD-10-CM | POA: Diagnosis present

## 2015-07-24 DIAGNOSIS — D62 Acute posthemorrhagic anemia: Secondary | ICD-10-CM | POA: Diagnosis present

## 2015-07-24 DIAGNOSIS — J449 Chronic obstructive pulmonary disease, unspecified: Secondary | ICD-10-CM | POA: Diagnosis present

## 2015-07-24 DIAGNOSIS — I482 Chronic atrial fibrillation: Secondary | ICD-10-CM | POA: Diagnosis present

## 2015-07-24 DIAGNOSIS — K7460 Unspecified cirrhosis of liver: Secondary | ICD-10-CM | POA: Diagnosis present

## 2015-07-24 DIAGNOSIS — I2510 Atherosclerotic heart disease of native coronary artery without angina pectoris: Secondary | ICD-10-CM | POA: Diagnosis present

## 2015-07-24 DIAGNOSIS — N179 Acute kidney failure, unspecified: Secondary | ICD-10-CM | POA: Diagnosis present

## 2015-07-24 DIAGNOSIS — Z681 Body mass index (BMI) 19 or less, adult: Secondary | ICD-10-CM | POA: Diagnosis not present

## 2015-07-24 DIAGNOSIS — R64 Cachexia: Secondary | ICD-10-CM | POA: Diagnosis present

## 2015-07-24 DIAGNOSIS — Z951 Presence of aortocoronary bypass graft: Secondary | ICD-10-CM | POA: Diagnosis not present

## 2015-07-24 DIAGNOSIS — K274 Chronic or unspecified peptic ulcer, site unspecified, with hemorrhage: Secondary | ICD-10-CM | POA: Diagnosis present

## 2015-07-24 DIAGNOSIS — Z8673 Personal history of transient ischemic attack (TIA), and cerebral infarction without residual deficits: Secondary | ICD-10-CM

## 2015-07-24 DIAGNOSIS — I959 Hypotension, unspecified: Secondary | ICD-10-CM | POA: Diagnosis present

## 2015-07-24 DIAGNOSIS — I350 Nonrheumatic aortic (valve) stenosis: Secondary | ICD-10-CM | POA: Diagnosis present

## 2015-07-24 DIAGNOSIS — I130 Hypertensive heart and chronic kidney disease with heart failure and stage 1 through stage 4 chronic kidney disease, or unspecified chronic kidney disease: Secondary | ICD-10-CM | POA: Diagnosis present

## 2015-07-24 DIAGNOSIS — I5022 Chronic systolic (congestive) heart failure: Secondary | ICD-10-CM | POA: Diagnosis present

## 2015-07-24 DIAGNOSIS — Z515 Encounter for palliative care: Secondary | ICD-10-CM | POA: Diagnosis not present

## 2015-07-24 DIAGNOSIS — IMO0001 Reserved for inherently not codable concepts without codable children: Secondary | ICD-10-CM | POA: Insufficient documentation

## 2015-07-24 DIAGNOSIS — E785 Hyperlipidemia, unspecified: Secondary | ICD-10-CM | POA: Diagnosis present

## 2015-07-24 DIAGNOSIS — E43 Unspecified severe protein-calorie malnutrition: Secondary | ICD-10-CM | POA: Diagnosis present

## 2015-07-24 DIAGNOSIS — I248 Other forms of acute ischemic heart disease: Secondary | ICD-10-CM | POA: Diagnosis present

## 2015-07-24 DIAGNOSIS — R188 Other ascites: Secondary | ICD-10-CM | POA: Diagnosis present

## 2015-07-24 DIAGNOSIS — E2740 Unspecified adrenocortical insufficiency: Secondary | ICD-10-CM | POA: Diagnosis present

## 2015-07-24 DIAGNOSIS — E1165 Type 2 diabetes mellitus with hyperglycemia: Secondary | ICD-10-CM | POA: Diagnosis present

## 2015-07-24 DIAGNOSIS — L8990 Pressure ulcer of unspecified site, unspecified stage: Secondary | ICD-10-CM | POA: Insufficient documentation

## 2015-07-24 DIAGNOSIS — R791 Abnormal coagulation profile: Secondary | ICD-10-CM | POA: Insufficient documentation

## 2015-07-24 DIAGNOSIS — B999 Unspecified infectious disease: Secondary | ICD-10-CM | POA: Insufficient documentation

## 2015-07-24 LAB — CBC WITH DIFFERENTIAL/PLATELET
Basophils Absolute: 0 10*3/uL (ref 0.0–0.1)
Basophils Relative: 0 %
Eosinophils Absolute: 0 10*3/uL (ref 0.0–0.7)
Eosinophils Relative: 0 %
HEMATOCRIT: 32.9 % — AB (ref 39.0–52.0)
Hemoglobin: 11.4 g/dL — ABNORMAL LOW (ref 13.0–17.0)
LYMPHS PCT: 7 %
Lymphs Abs: 1.6 10*3/uL (ref 0.7–4.0)
MCH: 29.5 pg (ref 26.0–34.0)
MCHC: 34.7 g/dL (ref 30.0–36.0)
MCV: 85.2 fL (ref 78.0–100.0)
MONO ABS: 2.1 10*3/uL — AB (ref 0.1–1.0)
MONOS PCT: 9 %
Neutro Abs: 20.2 10*3/uL — ABNORMAL HIGH (ref 1.7–7.7)
Neutrophils Relative %: 84 %
Platelets: 245 10*3/uL (ref 150–400)
RBC: 3.86 MIL/uL — ABNORMAL LOW (ref 4.22–5.81)
RDW: 14.5 % (ref 11.5–15.5)
WBC: 23.9 10*3/uL — ABNORMAL HIGH (ref 4.0–10.5)

## 2015-07-24 LAB — BASIC METABOLIC PANEL
Anion gap: 12 (ref 5–15)
BUN: 127 mg/dL — ABNORMAL HIGH (ref 6–20)
CO2: 23 mmol/L (ref 22–32)
CREATININE: 2.22 mg/dL — AB (ref 0.61–1.24)
Calcium: 9 mg/dL (ref 8.9–10.3)
Chloride: 91 mmol/L — ABNORMAL LOW (ref 101–111)
GFR calc Af Amer: 33 mL/min — ABNORMAL LOW (ref >60)
GFR calc non Af Amer: 28 mL/min — ABNORMAL LOW (ref >60)
GLUCOSE: 474 mg/dL — AB (ref 65–99)
Potassium: 4.5 mmol/L (ref 3.5–5.1)
Sodium: 126 mmol/L — ABNORMAL LOW (ref 135–145)

## 2015-07-24 LAB — URINALYSIS, ROUTINE W REFLEX MICROSCOPIC
BILIRUBIN URINE: NEGATIVE
Glucose, UA: >1000 mg/dL — AB
Ketones, ur: NEGATIVE mg/dL
NITRITE: NEGATIVE
Protein, ur: NEGATIVE mg/dL
SPECIFIC GRAVITY, URINE: 1.017 (ref 1.005–1.030)
UROBILINOGEN UA: 0.2 mg/dL (ref 0.0–1.0)
pH: 5 (ref 5.0–8.0)

## 2015-07-24 LAB — GLUCOSE, CAPILLARY
GLUCOSE-CAPILLARY: 304 mg/dL — AB (ref 65–99)
Glucose-Capillary: 350 mg/dL — ABNORMAL HIGH (ref 65–99)

## 2015-07-24 LAB — I-STAT CG4 LACTIC ACID, ED
LACTIC ACID, VENOUS: 3.08 mmol/L — AB (ref 0.5–2.0)
LACTIC ACID, VENOUS: 2 mmol/L (ref 0.5–2.0)

## 2015-07-24 LAB — URINE MICROSCOPIC-ADD ON

## 2015-07-24 LAB — PROTIME-INR
INR: 5.32 (ref 0.00–1.49)
Prothrombin Time: 47.1 seconds — ABNORMAL HIGH (ref 11.6–15.2)

## 2015-07-24 LAB — DIGOXIN LEVEL: DIGOXIN LVL: 1.6 ng/mL (ref 0.8–2.0)

## 2015-07-24 LAB — LACTIC ACID, PLASMA: Lactic Acid, Venous: 1.1 mmol/L (ref 0.5–2.0)

## 2015-07-24 LAB — C-REACTIVE PROTEIN: CRP: 13.6 mg/dL — AB (ref <1.0)

## 2015-07-24 LAB — TSH: TSH: 0.575 u[IU]/mL (ref 0.350–4.500)

## 2015-07-24 LAB — MRSA PCR SCREENING: MRSA BY PCR: NEGATIVE

## 2015-07-24 LAB — SEDIMENTATION RATE: Sed Rate: 95 mm/hr — ABNORMAL HIGH (ref 0–16)

## 2015-07-24 LAB — MAGNESIUM: MAGNESIUM: 2.4 mg/dL (ref 1.7–2.4)

## 2015-07-24 MED ORDER — PIPERACILLIN-TAZOBACTAM 3.375 G IVPB
3.3750 g | Freq: Three times a day (TID) | INTRAVENOUS | Status: DC
  Administered 2015-07-24 – 2015-07-30 (×16): 3.375 g via INTRAVENOUS
  Filled 2015-07-24 (×19): qty 50

## 2015-07-24 MED ORDER — VANCOMYCIN HCL IN DEXTROSE 750-5 MG/150ML-% IV SOLN
750.0000 mg | INTRAVENOUS | Status: DC
  Filled 2015-07-24: qty 150

## 2015-07-24 MED ORDER — SILVER SULFADIAZINE 1 % EX CREA
1.0000 "application " | TOPICAL_CREAM | Freq: Every day | CUTANEOUS | Status: DC
  Filled 2015-07-24: qty 85

## 2015-07-24 MED ORDER — VANCOMYCIN HCL IN DEXTROSE 1-5 GM/200ML-% IV SOLN
1000.0000 mg | Freq: Once | INTRAVENOUS | Status: AC
  Administered 2015-07-24: 1000 mg via INTRAVENOUS
  Filled 2015-07-24: qty 200

## 2015-07-24 MED ORDER — DIGOXIN 125 MCG PO TABS
125.0000 ug | ORAL_TABLET | Freq: Every day | ORAL | Status: DC
  Administered 2015-07-24 – 2015-08-07 (×14): 125 ug via ORAL
  Filled 2015-07-24 (×15): qty 1

## 2015-07-24 MED ORDER — SODIUM CHLORIDE 0.9 % IV BOLUS (SEPSIS): 500.0000 mL | INTRAVENOUS | Status: DC

## 2015-07-24 MED ORDER — SODIUM CHLORIDE 0.9 % IV BOLUS (SEPSIS): 1000.0000 mL | Freq: Once | INTRAVENOUS | Status: DC

## 2015-07-24 MED ORDER — ACETAMINOPHEN 325 MG PO TABS
650.0000 mg | ORAL_TABLET | Freq: Four times a day (QID) | ORAL | Status: DC | PRN
  Administered 2015-07-29 – 2015-08-03 (×3): 650 mg via ORAL
  Filled 2015-07-24 (×3): qty 2

## 2015-07-24 MED ORDER — SODIUM CHLORIDE 0.9 % IV SOLN
INTRAVENOUS | Status: DC
  Administered 2015-07-24: 100 mL/h via INTRAVENOUS
  Administered 2015-07-25 (×2): via INTRAVENOUS

## 2015-07-24 MED ORDER — SODIUM CHLORIDE 0.9 % IJ SOLN
3.0000 mL | Freq: Two times a day (BID) | INTRAMUSCULAR | Status: DC
  Administered 2015-07-24 – 2015-08-03 (×16): 3 mL via INTRAVENOUS

## 2015-07-24 MED ORDER — ATORVASTATIN CALCIUM 80 MG PO TABS
80.0000 mg | ORAL_TABLET | Freq: Every day | ORAL | Status: DC
Start: 2015-07-24 — End: 2015-08-01
  Administered 2015-07-24 – 2015-08-01 (×9): 80 mg via ORAL
  Filled 2015-07-24 (×9): qty 1

## 2015-07-24 MED ORDER — DILTIAZEM HCL ER COATED BEADS 180 MG PO CP24
180.0000 mg | ORAL_CAPSULE | Freq: Every day | ORAL | Status: DC
  Administered 2015-07-24 – 2015-07-25 (×2): 180 mg via ORAL
  Filled 2015-07-24 (×2): qty 1

## 2015-07-24 MED ORDER — SODIUM CHLORIDE 0.9 % IV BOLUS (SEPSIS)
1000.0000 mL | Freq: Once | INTRAVENOUS | Status: AC
Start: 2015-07-24 — End: 2015-07-24
  Administered 2015-07-24: 1000 mL via INTRAVENOUS

## 2015-07-24 MED ORDER — WARFARIN - PHARMACIST DOSING INPATIENT: Freq: Every day | Status: DC

## 2015-07-24 MED ORDER — INSULIN ASPART 100 UNIT/ML ~~LOC~~ SOLN: 0.0000 [IU] | Freq: Three times a day (TID) | SUBCUTANEOUS | Status: DC

## 2015-07-24 MED ORDER — FENOFIBRATE 160 MG PO TABS
160.0000 mg | ORAL_TABLET | Freq: Every day | ORAL | Status: DC
  Administered 2015-07-24 – 2015-08-01 (×9): 160 mg via ORAL
  Filled 2015-07-24 (×9): qty 1

## 2015-07-24 MED ORDER — LEVOTHYROXINE SODIUM 50 MCG PO TABS
50.0000 ug | ORAL_TABLET | Freq: Every day | ORAL | Status: DC
  Administered 2015-07-25 – 2015-08-07 (×14): 50 ug via ORAL
  Filled 2015-07-24 (×14): qty 1

## 2015-07-24 MED ORDER — INSULIN ASPART 100 UNIT/ML ~~LOC~~ SOLN
0.0000 [IU] | Freq: Three times a day (TID) | SUBCUTANEOUS | Status: DC
  Administered 2015-07-24: 11 [IU] via SUBCUTANEOUS
  Administered 2015-07-25: 3 [IU] via SUBCUTANEOUS

## 2015-07-24 MED ORDER — PIPERACILLIN-TAZOBACTAM 3.375 G IVPB 30 MIN
3.3750 g | Freq: Once | INTRAVENOUS | Status: AC
  Administered 2015-07-24: 3.375 g via INTRAVENOUS
  Filled 2015-07-24: qty 50

## 2015-07-24 MED ORDER — ENSURE ENLIVE PO LIQD: 237.0000 mL | Freq: Two times a day (BID) | ORAL | Status: DC

## 2015-07-24 MED ORDER — VANCOMYCIN HCL IN DEXTROSE 750-5 MG/150ML-% IV SOLN
750.0000 mg | INTRAVENOUS | Status: DC
  Administered 2015-07-25 – 2015-07-29 (×5): 750 mg via INTRAVENOUS
  Filled 2015-07-24 (×7): qty 150

## 2015-07-24 MED ORDER — SODIUM CHLORIDE 0.9 % IV BOLUS (SEPSIS)
500.0000 mL | Freq: Once | INTRAVENOUS | Status: AC
  Administered 2015-07-24: 500 mL via INTRAVENOUS

## 2015-07-24 NOTE — ED Notes (Signed)
INR critical lab 5.32 Dr. Clarene Duke notified
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
## 2015-07-24 NOTE — ED Notes (Signed)
MD aware of patient temperature.

## 2015-07-24 NOTE — ED Notes (Signed)
Admitting MD aware of patients BP

## 2015-07-24 NOTE — Progress Notes (Addendum)
ANTIBIOTIC CONSULT NOTE - INITIAL  Pharmacy Consult for zosyn and vancomycin Indication: rule out sepsis  No Known Allergies  Patient Measurements: Weight: 130 lb (58.968 kg)   Vital Signs: Temp: 96 F (35.6 C) (09/27 1218) Temp Source: Rectal (09/27 1218) BP: 104/45 mmHg (09/27 1330) Pulse Rate: 81 (09/27 1330) Intake/Output from previous day:   Intake/Output from this shift:    Labs:  Recent Labs  07/24/15 1151  WBC 23.9*  HGB 11.4*  PLT 245  CREATININE 2.22*   CrCl cannot be calculated (Unknown ideal weight.). No results for input(s): VANCOTROUGH, VANCOPEAK, VANCORANDOM, GENTTROUGH, GENTPEAK, GENTRANDOM, TOBRATROUGH, TOBRAPEAK, TOBRARND, AMIKACINPEAK, AMIKACINTROU, AMIKACIN in the last 72 hours.   Microbiology: No results found for this or any previous visit (from the past 720 hour(s)).  Medical History: Past Medical History  Diagnosis Date  . Anemia   . CAD (coronary artery disease)   . History of GI bleed   . PAD (peripheral artery disease)   . Atrial fibrillation   . Hypothyroidism   . Hyperlipidemia   . Hypertension   . Chronic kidney disease     chronic renal insufficiency  . COPD (chronic obstructive pulmonary disease)   . Dehiscence of operative wound   . H/O hiatal hernia   . Type II diabetes mellitus   . History of blood transfusion     "some; related to surgeries & LGIB"  . GERD (gastroesophageal reflux disease)   . Stroke     "he's had 6 mini strokes/last CT scan, probably more since; stroke in right eye, can't see out of it since" (08/15/2014)  . Depression     Assessment: 69 yo  M in ED from home with R 4th finger blackened skin at fingertip, pressure ulcers on buttocks.  Pharmacy consulted to dose zosyn and vancomycin for sepsis. Pt with B LE amputations, bed-bound.   Wt 59 kg, WBC 23.9, creat 2.22, lactate 3.08, temp low at 96.  9/27 CXR: neg.  9/27 Xray finger: c/w cellulitis or infxn of soft tissues  Zosyn 9/27>> vanc  9/27>>  9/27 BCx2>> 9/27 Ucx>>  Goal of Therapy:  Vancomycin trough level 15-20 mcg/ml  Plan:  -zosyn 3.375 gm IV x 1 dose over 30 minutes then zosyn 3.375 gm IV q8h, infuse each dose over 4 hours -vancomycin 1 gm IV x 1 dose given in ED then vancomycin 750 IV q24 -f/u renal fxn closely, wbc, temp, culture data -vancomycin levels as needed  Herby Abraham, Pharm.D. [phone] 07/24/2015 1:51 PM  Addendum: Also resuming patients home warfarin. INR is elevated at 5.32. H/H slightly low and platelets are WNL.   Plan: - No warfarin tonight - Daily INR  Lysle Pearl, PharmD, BCPS Pager # [phone] 07/24/2015 3:38 PM

## 2015-07-24 NOTE — ED Notes (Signed)
Admitting MD at bedside.
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
## 2015-07-24 NOTE — ED Notes (Signed)
Per EMS - pt from home. Hx vascular issues. Right 4th finger has blackened skin at fingertip. Pressure ulcers on buttocks that have worsened progressively the past few days. Recently started having help from home health. A&O x 4, hx hypotension.

## 2015-07-25 ENCOUNTER — Inpatient Hospital Stay (HOSPITAL_COMMUNITY): Payer: Commercial Managed Care - HMO

## 2015-07-25 ENCOUNTER — Other Ambulatory Visit (HOSPITAL_COMMUNITY): Payer: 59

## 2015-07-25 DIAGNOSIS — I96 Gangrene, not elsewhere classified: Secondary | ICD-10-CM

## 2015-07-25 DIAGNOSIS — I4891 Unspecified atrial fibrillation: Secondary | ICD-10-CM

## 2015-07-25 DIAGNOSIS — IMO0001 Reserved for inherently not codable concepts without codable children: Secondary | ICD-10-CM | POA: Insufficient documentation

## 2015-07-25 DIAGNOSIS — R627 Adult failure to thrive: Secondary | ICD-10-CM

## 2015-07-25 DIAGNOSIS — A419 Sepsis, unspecified organism: Principal | ICD-10-CM

## 2015-07-25 DIAGNOSIS — I959 Hypotension, unspecified: Secondary | ICD-10-CM

## 2015-07-25 DIAGNOSIS — L8990 Pressure ulcer of unspecified site, unspecified stage: Secondary | ICD-10-CM | POA: Insufficient documentation

## 2015-07-25 LAB — CBC
HEMATOCRIT: 27.3 % — AB (ref 39.0–52.0)
HEMOGLOBIN: 9.1 g/dL — AB (ref 13.0–17.0)
MCH: 28 pg (ref 26.0–34.0)
MCHC: 33.3 g/dL (ref 30.0–36.0)
MCV: 84 fL (ref 78.0–100.0)
PLATELETS: 236 10*3/uL (ref 150–400)
RBC: 3.25 MIL/uL — AB (ref 4.22–5.81)
RDW: 14.5 % (ref 11.5–15.5)
WBC: 21.2 10*3/uL — AB (ref 4.0–10.5)

## 2015-07-25 LAB — BASIC METABOLIC PANEL
ANION GAP: 9 (ref 5–15)
BUN: 95 mg/dL — ABNORMAL HIGH (ref 6–20)
CO2: 21 mmol/L — ABNORMAL LOW (ref 22–32)
Calcium: 8 mg/dL — ABNORMAL LOW (ref 8.9–10.3)
Chloride: 102 mmol/L (ref 101–111)
Creatinine, Ser: 1.52 mg/dL — ABNORMAL HIGH (ref 0.61–1.24)
GFR, EST AFRICAN AMERICAN: 52 mL/min — AB (ref >60)
GFR, EST NON AFRICAN AMERICAN: 45 mL/min — AB (ref >60)
GLUCOSE: 59 mg/dL — AB (ref 65–99)
POTASSIUM: 4.1 mmol/L (ref 3.5–5.1)
Sodium: 132 mmol/L — ABNORMAL LOW (ref 135–145)

## 2015-07-25 LAB — LIPID PANEL
Cholesterol: 90 mg/dL (ref 0–200)
HDL: 26 mg/dL — ABNORMAL LOW (ref >40)
LDL CALC: 42 mg/dL (ref 0–99)
Total CHOL/HDL Ratio: 3.5 RATIO
Triglycerides: 111 mg/dL (ref <150)
VLDL: 22 mg/dL (ref 0–40)

## 2015-07-25 LAB — HEPATIC FUNCTION PANEL
ALBUMIN: 2 g/dL — AB (ref 3.5–5.0)
ALK PHOS: 56 U/L (ref 38–126)
ALT: 39 U/L (ref 17–63)
AST: 70 U/L — ABNORMAL HIGH (ref 15–41)
BILIRUBIN INDIRECT: 0.4 mg/dL (ref 0.3–0.9)
BILIRUBIN TOTAL: 0.6 mg/dL (ref 0.3–1.2)
Bilirubin, Direct: 0.2 mg/dL (ref 0.1–0.5)
TOTAL PROTEIN: 5 g/dL — AB (ref 6.5–8.1)

## 2015-07-25 LAB — GLUCOSE, CAPILLARY
GLUCOSE-CAPILLARY: 54 mg/dL — AB (ref 65–99)
GLUCOSE-CAPILLARY: 75 mg/dL (ref 65–99)
GLUCOSE-CAPILLARY: 206 mg/dL — AB (ref 65–99)
GLUCOSE-CAPILLARY: 148 mg/dL — AB (ref 65–99)
GLUCOSE-CAPILLARY: 322 mg/dL — AB (ref 65–99)
Glucose-Capillary: 395 mg/dL — ABNORMAL HIGH (ref 65–99)

## 2015-07-25 LAB — HEMOGLOBIN A1C
HEMOGLOBIN A1C: 8.3 % — AB (ref 4.8–5.6)
Mean Plasma Glucose: 192 mg/dL

## 2015-07-25 LAB — PROTIME-INR
INR: 8.09 — AB (ref 0.00–1.49)
Prothrombin Time: 64.5 seconds — ABNORMAL HIGH (ref 11.6–15.2)

## 2015-07-25 LAB — URINE CULTURE: CULTURE: NO GROWTH

## 2015-07-25 MED ORDER — GLUCERNA SHAKE PO LIQD
237.0000 mL | Freq: Three times a day (TID) | ORAL | Status: DC
  Administered 2015-07-25 – 2015-08-07 (×26): 237 mL via ORAL

## 2015-07-25 MED ORDER — PHYTONADIONE 5 MG PO TABS
2.5000 mg | ORAL_TABLET | Freq: Once | ORAL | Status: AC
  Administered 2015-07-25: 2.5 mg via ORAL
  Filled 2015-07-25: qty 1

## 2015-07-25 MED ORDER — INSULIN ASPART 100 UNIT/ML ~~LOC~~ SOLN
0.0000 [IU] | Freq: Three times a day (TID) | SUBCUTANEOUS | Status: DC
  Administered 2015-07-25: 9 [IU] via SUBCUTANEOUS
  Administered 2015-07-26: 3 [IU] via SUBCUTANEOUS

## 2015-07-25 MED ORDER — DEXAMETHASONE SODIUM PHOSPHATE 4 MG/ML IJ SOLN
4.0000 mg | Freq: Two times a day (BID) | INTRAMUSCULAR | Status: DC
  Administered 2015-07-25 – 2015-07-26 (×3): 4 mg via INTRAVENOUS
  Filled 2015-07-25 (×3): qty 1

## 2015-07-25 MED ORDER — INSULIN ASPART 100 UNIT/ML ~~LOC~~ SOLN
0.0000 [IU] | Freq: Three times a day (TID) | SUBCUTANEOUS | Status: DC
  Administered 2015-07-25: 1 [IU] via SUBCUTANEOUS
  Administered 2015-07-25: 7 [IU] via SUBCUTANEOUS

## 2015-07-25 MED ORDER — SILVER SULFADIAZINE 1 % EX CREA
1.0000 "application " | TOPICAL_CREAM | Freq: Every day | CUTANEOUS | Status: DC
  Administered 2015-07-25 – 2015-08-07 (×11): 1 via TOPICAL
  Filled 2015-07-25 (×2): qty 85

## 2015-07-25 MED ORDER — SODIUM CHLORIDE 0.9 % IV BOLUS (SEPSIS)
1000.0000 mL | Freq: Once | INTRAVENOUS | Status: AC
  Administered 2015-07-25: 1000 mL via INTRAVENOUS

## 2015-07-25 NOTE — Progress Notes (Signed)
No return call from [facility] Medicine Teaching Service at this time.  RN called and spoke with Veronda pharmacist at the pharmacy in the hospital and discussed patient's recent PT/INR lab values this morning and no external bleeding noted per RN at this time.  No new orders received at this time.

## 2015-07-25 NOTE — Progress Notes (Signed)
Utilization Review Completed.  

## 2015-07-25 NOTE — Progress Notes (Signed)
RN text paged Family Medicine Teaching Service [phone]) pertaining to patient's most recent blood pressure.  Patient denies feelings of lightheadedness and dizziness at this time.

## 2015-07-25 NOTE — Progress Notes (Signed)
   VASCULAR SURGERY ASSESSMENT & PLAN:  * Duplex of right UE and bilat. UE doppler pending. I suspect that there will be limited options for revascularization  * Will need help from the hand surgeons once arterial w/u complete.  * His severe protein malnutrition will be a major issue.  SUBJECTIVE: No complaints this AM  PHYSICAL EXAM: Filed Vitals:   07/24/15 2035 07/24/15 2348 07/25/15 0357 07/25/15 0405  BP: 91/32 95/26  86/23  Pulse:  66 78   Temp:  98 F (36.7 C) 98.5 F (36.9 C)   TempSrc:  Oral Oral   Resp:  18 19   Height:      Weight:      SpO2:  100% 100%    Dry gangrene of right ring finger unchanged.   LABS: Lab Results  Component Value Date   WBC 21.2* 07/25/2015   HGB 9.1* 07/25/2015   HCT 27.3* 07/25/2015   MCV 84.0 07/25/2015   PLT 236 07/25/2015   Lab Results  Component Value Date   CREATININE 1.52* 07/25/2015   Lab Results  Component Value Date   INR 8.09* 07/25/2015   CBG (last 3)   Recent Labs  07/24/15 2035 07/25/15 0537 07/25/15 0600  GLUCAP 304* 54* 75    Active Problems:   FTT (failure to thrive) in adult   Hypotension   Sepsis  Cari Caraway Beeper: [phone] 07/25/2015

## 2015-07-25 NOTE — Progress Notes (Signed)
ANTICOAGULATION CONSULT NOTE - Follow Up Consult  Pharmacy Consult: Coumadin Indication: atrial fibrillation  No Known Allergies  Patient Measurements: Height:  (180.3 cm) (prior to amputation) Weight: 130 lb 11.7 oz (59.3 kg) IBW/kg (Calculated) : 75.3  Vital Signs: Temp: 98.3 F (36.8 C) (09/28 0839) Temp Source: Oral (09/28 0839) BP: 88/44 mmHg (09/28 0839) Pulse Rate: 85 (09/28 0839)  Labs:  Recent Labs  07/24/15 1151 07/25/15 0244  HGB 11.4* 9.1*  HCT 32.9* 27.3*  PLT 245 236  LABPROT 47.1* 64.5*  INR 5.32* 8.09*  CREATININE 2.22* 1.52*    Estimated Creatinine Clearance: 37.9 mL/min (by C-G formula based on Cr of 1.52).    Assessment: 69 YOM on Coumadin PTA for history of AFib.  Patient's INR was supra-therapeutic on admit and Coumadin was held last night.  INR increased to 8.09 today and Vitamin K 2.5mg  PO was given.  Patient has a history of GIB, but no bleeding has been reported.   Goal of Therapy:  INR 2-3 Vanc trough 15-20 mcg/mL    Plan:  - Continue to hold Coumadin - Daily PT / INR - Vanc  IV Q24H - Zosyn 3.375gm IV Q8H, 4 hr infusion - Monitor renal fxn, clinical progress, vanc trough prior to 4th dose - Monitor for digoxin toxicity, watch CBGs - CMP in AM    Thuy D. Laney Potash, PharmD, BCPS Pager:  [phone] 07/25/2015, 10:55 AM

## 2015-07-25 NOTE — Clinical Documentation Improvement (Signed)
Family Medicine  Can the diagnosis of CKD be further specified?   CKD Stage I - GFR greater than or equal to 90  CKD Stage II - GFR 60-89  CKD Stage III - GFR 30-59  CKD Stage IV - GFR 15-29  Other condition  Unable to clinically determine   Supporting Information: : (risk factors, signs and symptoms, diagnostics, treatment) Acute kidney injury documented this admission as well as history of CKD noted.  Current renal labs: Component      BUN Creatinine  Latest Ref Rng      6 - 20 mg/dL 0.61 - 1.24 mg/dL  07/24/2015     11:51 AM 127 (H) 2.22 (H)  07/25/2015     2:44 AM 95 (H) 1.52 (H)   Component      EGFR (Non-African Amer.)  Latest Ref Rng      >60 mL/min  07/24/2015     11:51 AM 28 (L)  07/25/2015     2:44 AM 45 (L)    Please exercise your independent, professional judgment when responding. A specific answer is not anticipated or expected.   Thank you, Mateo Flow, RN [phone] Clinical Documentation Specialist
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
## 2015-07-25 NOTE — Progress Notes (Signed)
Received a call from Dr. Jennette Kettle Requesting current vital signs and condition.  Reported BP of 74/31 taken on patient's left upper arm. Patient alert and oriented presently. Appetite is poor, patient swallows without difficulty.

## 2015-07-25 NOTE — Progress Notes (Signed)
  Echocardiogram 2D Echocardiogram has been performed.  [name] 07/25/2015, 11:41 AM
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
## 2015-07-25 NOTE — Progress Notes (Signed)
Inpatient Diabetes Program Recommendations  AACE/ADA: New Consensus Statement on Inpatient Glycemic Control (2015)  Target Ranges:  Prepandial:   less than 140 mg/dL      Peak postprandial:   less than 180 mg/dL (1-2 hours)      Critically ill patients:  140 - 180 mg/dL   Review of Glycemic Control:  Results for ZAIDAN, KEEBLE (MRN [id number]) as of 07/25/2015 09:58  Ref. Range 07/24/2015 17:41 07/24/2015 20:35 07/25/2015 05:37 07/25/2015 06:00 07/25/2015 08:37  Glucose-Capillary Latest Ref Range: 65-99 mg/dL 782 (H) 956 (H) 54 (L) 75 206 (H)   Diabetes history: Type 2 Diabetes Outpatient Diabetes medications: U500 insulin 6 units daily, Amaryl 2 mg daily Current orders for Inpatient glycemic control:  Novolog moderate tid with meals  Inpatient Diabetes Program Recommendations:   It appears that patient is very sensitive to insulin. May consider decreasing Novolog to sensitive tid with meals.  Also consider adding very low dose basal insulin such as Levemir 6 units daily.  Thanks, Beryl Meager, RN, BC-ADM Inpatient Diabetes Coordinator Pager [phone] (8a-5p)

## 2015-07-25 NOTE — Progress Notes (Signed)
Lab called at about [phone] stating patient INR this morning was 8.09 and PT was 64.5.  RN text paged this information to [facility] Medicine Teaching Service [phone]).

## 2015-07-26 ENCOUNTER — Inpatient Hospital Stay (HOSPITAL_COMMUNITY): Payer: Commercial Managed Care - HMO

## 2015-07-26 DIAGNOSIS — I96 Gangrene, not elsewhere classified: Secondary | ICD-10-CM

## 2015-07-26 DIAGNOSIS — F05 Delirium due to known physiological condition: Secondary | ICD-10-CM

## 2015-07-26 LAB — COMPREHENSIVE METABOLIC PANEL
ALT: 41 U/L (ref 17–63)
AST: 53 U/L — ABNORMAL HIGH (ref 15–41)
Albumin: 1.8 g/dL — ABNORMAL LOW (ref 3.5–5.0)
Alkaline Phosphatase: 31 U/L — ABNORMAL LOW (ref 38–126)
Anion gap: 7 (ref 5–15)
BUN: 76 mg/dL — ABNORMAL HIGH (ref 6–20)
CHLORIDE: 111 mmol/L (ref 101–111)
CO2: 19 mmol/L — ABNORMAL LOW (ref 22–32)
CREATININE: 1.45 mg/dL — AB (ref 0.61–1.24)
Calcium: 8 mg/dL — ABNORMAL LOW (ref 8.9–10.3)
GFR, EST AFRICAN AMERICAN: 55 mL/min — AB (ref >60)
GFR, EST NON AFRICAN AMERICAN: 47 mL/min — AB (ref >60)
Glucose, Bld: 235 mg/dL — ABNORMAL HIGH (ref 65–99)
POTASSIUM: 4.5 mmol/L (ref 3.5–5.1)
Sodium: 137 mmol/L (ref 135–145)
Total Bilirubin: 0.6 mg/dL (ref 0.3–1.2)
Total Protein: 5 g/dL — ABNORMAL LOW (ref 6.5–8.1)

## 2015-07-26 LAB — TROPONIN I
TROPONIN I: 0.37 ng/mL — AB (ref <0.031)
TROPONIN I: 0.3 ng/mL — AB (ref <0.031)

## 2015-07-26 LAB — CBC
HCT: 22.2 % — ABNORMAL LOW (ref 39.0–52.0)
Hemoglobin: 7.2 g/dL — ABNORMAL LOW (ref 13.0–17.0)
MCH: 27.8 pg (ref 26.0–34.0)
MCHC: 32.4 g/dL (ref 30.0–36.0)
MCV: 85.7 fL (ref 78.0–100.0)
PLATELETS: 214 10*3/uL (ref 150–400)
RBC: 2.59 MIL/uL — ABNORMAL LOW (ref 4.22–5.81)
RDW: 15.4 % (ref 11.5–15.5)
WBC: 27.2 10*3/uL — AB (ref 4.0–10.5)

## 2015-07-26 LAB — GLUCOSE, CAPILLARY
GLUCOSE-CAPILLARY: 215 mg/dL — AB (ref 65–99)
GLUCOSE-CAPILLARY: 270 mg/dL — AB (ref 65–99)
GLUCOSE-CAPILLARY: 277 mg/dL — AB (ref 65–99)
Glucose-Capillary: 219 mg/dL — ABNORMAL HIGH (ref 65–99)

## 2015-07-26 LAB — DIC (DISSEMINATED INTRAVASCULAR COAGULATION)PANEL
D-Dimer, Quant: <0.27 ug/mL-FEU (ref 0.00–0.48)
Fibrinogen: 582 mg/dL — ABNORMAL HIGH (ref 204–475)
Platelets: 207 10*3/uL (ref 150–400)
Prothrombin Time: 33.5 seconds — ABNORMAL HIGH (ref 11.6–15.2)

## 2015-07-26 LAB — CBC WITH DIFFERENTIAL/PLATELET
BASOS ABS: 0 10*3/uL (ref 0.0–0.1)
BASOS PCT: 0 %
Eosinophils Absolute: 0 10*3/uL (ref 0.0–0.7)
Eosinophils Relative: 0 %
HEMATOCRIT: 22.3 % — AB (ref 39.0–52.0)
Hemoglobin: 7.4 g/dL — ABNORMAL LOW (ref 13.0–17.0)
Lymphocytes Relative: 5 %
Lymphs Abs: 1.1 10*3/uL (ref 0.7–4.0)
MCH: 28.5 pg (ref 26.0–34.0)
MCHC: 33.2 g/dL (ref 30.0–36.0)
MCV: 85.8 fL (ref 78.0–100.0)
MONO ABS: 1.2 10*3/uL — AB (ref 0.1–1.0)
Monocytes Relative: 5 %
NEUTROS ABS: 20.4 10*3/uL — AB (ref 1.7–7.7)
Neutrophils Relative %: 90 %
PLATELETS: 176 10*3/uL (ref 150–400)
RBC: 2.6 MIL/uL — ABNORMAL LOW (ref 4.22–5.81)
RDW: 15.2 % (ref 11.5–15.5)
WBC: 22.7 10*3/uL — ABNORMAL HIGH (ref 4.0–10.5)

## 2015-07-26 LAB — PREPARE RBC (CROSSMATCH)

## 2015-07-26 LAB — CORTISOL-AM, BLOOD: CORTISOL - AM: 9.3 ug/dL (ref 6.7–22.6)

## 2015-07-26 LAB — DIC (DISSEMINATED INTRAVASCULAR COAGULATION) PANEL
APTT: 64 s — AB (ref 24–37)
INR: 3.39 — AB (ref 0.00–1.49)
SMEAR REVIEW: NONE SEEN

## 2015-07-26 LAB — PROTIME-INR
INR: 3.72 — AB (ref 0.00–1.49)
PROTHROMBIN TIME: 36 s — AB (ref 11.6–15.2)

## 2015-07-26 MED ORDER — SODIUM CHLORIDE 0.9 % IV BOLUS (SEPSIS)
1000.0000 mL | Freq: Once | INTRAVENOUS | Status: AC
  Administered 2015-07-26: 1000 mL via INTRAVENOUS

## 2015-07-26 MED ORDER — SODIUM CHLORIDE 0.9 % IV SOLN
Freq: Once | INTRAVENOUS | Status: AC
  Administered 2015-07-26: 15:00:00 via INTRAVENOUS

## 2015-07-26 MED ORDER — LACTATED RINGERS IV SOLN
INTRAVENOUS | Status: DC
  Administered 2015-07-26 – 2015-08-01 (×14): via INTRAVENOUS
  Administered 2015-08-01: 125 mL/h via INTRAVENOUS
  Administered 2015-08-02 (×2): 1000 mL via INTRAVENOUS
  Administered 2015-08-02 – 2015-08-04 (×5): via INTRAVENOUS

## 2015-07-26 MED ORDER — INSULIN ASPART 100 UNIT/ML ~~LOC~~ SOLN
0.0000 [IU] | Freq: Every day | SUBCUTANEOUS | Status: DC
  Administered 2015-07-26 – 2015-07-29 (×2): 2 [IU] via SUBCUTANEOUS
  Administered 2015-07-29: 3 [IU] via SUBCUTANEOUS
  Administered 2015-07-31: 2 [IU] via SUBCUTANEOUS
  Administered 2015-08-01 – 2015-08-04 (×2): 3 [IU] via SUBCUTANEOUS
  Administered 2015-08-05: 0 [IU] via SUBCUTANEOUS
  Administered 2015-08-06: 2 [IU] via SUBCUTANEOUS

## 2015-07-26 MED ORDER — INSULIN ASPART 100 UNIT/ML ~~LOC~~ SOLN
0.0000 [IU] | Freq: Three times a day (TID) | SUBCUTANEOUS | Status: DC
  Administered 2015-07-26: 5 [IU] via SUBCUTANEOUS
  Administered 2015-07-26: 8 [IU] via SUBCUTANEOUS
  Administered 2015-07-27: 5 [IU] via SUBCUTANEOUS
  Administered 2015-07-27: 8 [IU] via SUBCUTANEOUS
  Administered 2015-07-28: 3 [IU] via SUBCUTANEOUS
  Administered 2015-07-28 – 2015-07-29 (×3): 8 [IU] via SUBCUTANEOUS
  Administered 2015-07-29: 11 [IU] via SUBCUTANEOUS
  Administered 2015-07-30: 5 [IU] via SUBCUTANEOUS
  Administered 2015-07-30: 3 [IU] via SUBCUTANEOUS
  Administered 2015-07-31: 5 [IU] via SUBCUTANEOUS
  Administered 2015-07-31: 8 [IU] via SUBCUTANEOUS
  Administered 2015-07-31: 5 [IU] via SUBCUTANEOUS
  Administered 2015-08-01: 3 [IU] via SUBCUTANEOUS
  Administered 2015-08-01: 5 [IU] via SUBCUTANEOUS
  Administered 2015-08-02: 3 [IU] via SUBCUTANEOUS
  Administered 2015-08-02: 5 [IU] via SUBCUTANEOUS
  Administered 2015-08-02: 3 [IU] via SUBCUTANEOUS
  Administered 2015-08-03: 2 [IU] via SUBCUTANEOUS
  Administered 2015-08-03: 8 [IU] via SUBCUTANEOUS
  Administered 2015-08-03: 3 [IU] via SUBCUTANEOUS
  Administered 2015-08-04: 2 [IU] via SUBCUTANEOUS
  Administered 2015-08-05 (×2): 3 [IU] via SUBCUTANEOUS
  Administered 2015-08-05: 5 [IU] via SUBCUTANEOUS
  Administered 2015-08-06: 3 [IU] via SUBCUTANEOUS
  Administered 2015-08-06: 2 [IU] via SUBCUTANEOUS
  Administered 2015-08-06: 5 [IU] via SUBCUTANEOUS
  Administered 2015-08-07: 3 [IU] via SUBCUTANEOUS
  Administered 2015-08-07: 5 [IU] via SUBCUTANEOUS
  Administered 2015-08-07: 8 [IU] via SUBCUTANEOUS

## 2015-07-26 MED ORDER — HYDROCORTISONE NA SUCCINATE PF 100 MG IJ SOLR
50.0000 mg | Freq: Four times a day (QID) | INTRAMUSCULAR | Status: DC
  Administered 2015-07-26 – 2015-07-30 (×14): 50 mg via INTRAVENOUS
  Filled 2015-07-26 (×14): qty 2
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
## 2015-07-26 NOTE — Consult Note (Signed)
[name] is an 69 y.o. male.   Chief Complaint: right ring finger gangrene HPI: 69 yo rhd male with multiple medical problems admitted with right ring finger gangrene and pressure ulcers.  Son present in room.  They report no fevers, chills, night sweats.  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    Past Medical History  Diagnosis Date  . Anemia   . CAD (coronary artery disease)   . History of GI bleed   . PAD (peripheral artery disease)   . Atrial fibrillation   . Hypothyroidism   . Hyperlipidemia   . Hypertension   . Chronic kidney disease     chronic renal insufficiency  . COPD (chronic obstructive pulmonary disease)   . Dehiscence of operative wound   . H/O hiatal hernia   . Type II diabetes mellitus   . History of blood transfusion     "some; related to surgeries & LGIB"  . GERD (gastroesophageal reflux disease)   . Stroke     "he's had 6 mini strokes/last CT scan, probably more since; stroke in right eye, can't see out of it since" (08/15/2014)  . Depression     Past Surgical History  Procedure Laterality Date  . Femoral bypass Right 12/29/2002    w/ Goretex   by Dr. Tinnie Gens  . Femoral bypass Left 12/01/2001    Archie Endo 03/11/2011  . 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 Above knee leg amputation Right 06/05/09  . Leg amputation above knee Right 04/11/2010    Revision of right above knee amputation  . Coronary artery bypass graft  2002    CABG X4; By Servando Snare  . Below knee leg amputation Left 06/27/2011    Archie Endo 06/27/2011  . Incision and drainage of wound Right 01/21/2003    distal thigh and popliteal space/notes 03/11/2011  . Cardiac catheterization  08/2001    Archie Endo 03/11/2011  . Drainage and closure of lymphocele Left 01/17/2002    Archie Endo 03/11/2011  . Debridement and closure wound Right 02/01/2003    partial closure of distal thigh & popliteal wound/notes  03/11/2011  . Femoral endarterectomy Right 07/12/2003    Archie Endo 03/11/2011  . Femoropopliteal thrombectomy / embolectomy Right 01/29/2005    Archie Endo 03/11/2011  . Femoral bypass Left 09/09/2005    Archie Endo 03/11/2011  . Femoropopliteal thrombectomy / embolectomy Right 02/06/2006    Archie Endo 03/11/2011  . Axillary-femoral bypass graft Right 02/06/2006    Archie Endo 03/11/2011  . Thrombectomy / embolectomy axillary artery Right 02/08/2006    Archie Endo 03/11/2011  . Debridement  foot Right 02/12/2006    & amputation right third toe/notes 03/11/2011  . Wound debridement Right 03/30/2006    BTK amputation/notes 03/11/2011  . Debridement and closure wound Right 04/23/2006; 04/26/2006    closure of BTK amputation/notes 03/11/2011  . Transmetatarsal amputation Left 06/24/2011    "#3, 4, & 5/notes 06/26/2011    History reviewed. No pertinent family history. Social History:  reports that he has been smoking Cigarettes.  He has a 58 pack-year smoking history. He has never used smokeless tobacco. He reports that he drinks alcohol. He reports that he does not use illicit drugs.  Allergies: No Known Allergies  Medications Prior to Admission  Medication Sig Dispense Refill  . acetaminophen (TYLENOL) 325 MG tablet Take 650 mg by mouth every 6 (six) hours as needed (pain).    [name] atorvastatin (LIPITOR) 80 MG tablet Take  80 mg by mouth daily.    . benazepril-hydrochlorthiazide (LOTENSIN HCT) 10-12.5 MG tablet Take 1 tablet by mouth daily.    . diazepam (VALIUM) 10 MG tablet Take 5 mg by mouth daily as needed for anxiety.    . digoxin (LANOXIN) 0.125 MG tablet Take 125 mcg by mouth daily.      [name] diltiazem (CARDIZEM CD) 180 MG 24 hr capsule Take 1 capsule (180 mg total) by mouth daily. 30 capsule 5  . Docusate Calcium (STOOL SOFTENER PO) Take 1 tablet by mouth daily.    . fenofibrate 160 MG tablet Take 160 mg by mouth daily.      . furosemide (LASIX) 40 MG tablet Take 40 mg by mouth daily.    [name] glimepiride (AMARYL) 4 MG tablet Take 2  mg by mouth daily.    . insulin regular human CONCENTRATED (HUMULIN R) 500 UNIT/ML SOLN injection Inject 6 Units into the skin daily.     [name] levothyroxine (SYNTHROID, LEVOTHROID) 50 MCG tablet Take 50 mcg by mouth daily.      . metoprolol (LOPRESSOR) 50 MG tablet Take 25 mg by mouth 2 (two) times daily.     . Multiple Vitamin (MULTIVITAMIN) tablet Take 1 tablet by mouth daily.      . silver sulfADIAZINE (SILVADENE) 1 % cream Apply 1 application topically daily. 50 g 0  . warfarin (COUMADIN) 3 MG tablet Take 1.5-3 mg by mouth daily. Take 3 mg by mouth on Monday and Friday. Take 1.5 mg by mouth on all other days.      Results for orders placed or performed during the hospital encounter of 07/24/15 (from the past 48 hour(s))  MRSA PCR Screening     Status: None   Collection Time: 07/24/15  6:50 PM  Result Value Ref Range   MRSA by PCR NEGATIVE NEGATIVE    Comment:        The GeneXpert MRSA Assay (FDA approved for NASAL specimens only), is one component of a comprehensive MRSA colonization surveillance program. It is not intended to diagnose MRSA infection nor to guide or monitor treatment for MRSA infections.   TSH     Status: None   Collection Time: 07/24/15  7:28 PM  Result Value Ref Range   TSH 0.575 0.350 - 4.500 uIU/mL  Hemoglobin A1c     Status: Abnormal   Collection Time: 07/24/15  7:28 PM  Result Value Ref Range   Hgb A1c MFr Bld 8.3 (H) 4.8 - 5.6 %    Comment: (NOTE)         Pre-diabetes: 5.7 - 6.4         Diabetes: >6.4         Glycemic control for adults with diabetes: <7.0    Mean Plasma Glucose 192 mg/dL    Comment: (NOTE) Performed At: [facility] [place], [place] [id number] Lindon Romp MD [id number]   Digoxin level     Status: None   Collection Time: 07/24/15  7:28 PM  Result Value Ref Range   Digoxin Level 1.6 0.8 - 2.0 ng/mL  Lactic acid, plasma     Status: None   Collection Time: 07/24/15  7:28 PM  Result Value Ref Range    Lactic Acid, Venous 1.1 0.5 - 2.0 mmol/L  Glucose, capillary     Status: Abnormal   Collection Time: 07/24/15  8:35 PM  Result Value Ref Range   Glucose-Capillary 304 (H) 65 - 99 mg/dL  Protime-INR  Status: Abnormal   Collection Time: 07/25/15  2:44 AM  Result Value Ref Range   Prothrombin Time 64.5 (H) 11.6 - 15.2 seconds   INR 8.09 (HH) 0.00 - 1.49    Comment: CRITICAL RESULT CALLED TO, READ BACK BY AND VERIFIED WITH: FAULKNER,B RN 07/25/2015 0441 JORDANS REPEATED TO VERIFY SPECIMEN CHECKED FOR CLOTS   Basic metabolic panel     Status: Abnormal   Collection Time: 07/25/15  2:44 AM  Result Value Ref Range   Sodium 132 (L) 135 - 145 mmol/L   Potassium 4.1 3.5 - 5.1 mmol/L   Chloride 102 101 - 111 mmol/L   CO2 21 (L) 22 - 32 mmol/L   Glucose, Bld 59 (L) 65 - 99 mg/dL   BUN 95 (H) 6 - 20 mg/dL   Creatinine, Ser 1.52 (H) 0.61 - 1.24 mg/dL   Calcium 8.0 (L) 8.9 - 10.3 mg/dL   GFR calc non Af Amer 45 (L) >60 mL/min   GFR calc Af Amer 52 (L) >60 mL/min    Comment: (NOTE) The eGFR has been calculated using the CKD EPI equation. This calculation has not been validated in all clinical situations. eGFR's persistently <60 mL/min signify possible Chronic Kidney Disease.    Anion gap 9 5 - 15  CBC     Status: Abnormal   Collection Time: 07/25/15  2:44 AM  Result Value Ref Range   WBC 21.2 (H) 4.0 - 10.5 K/uL   RBC 3.25 (L) 4.22 - 5.81 MIL/uL   Hemoglobin 9.1 (L) 13.0 - 17.0 g/dL    Comment: DELTA CHECK NOTED REPEATED TO VERIFY    HCT 27.3 (L) 39.0 - 52.0 %   MCV 84.0 78.0 - 100.0 fL   MCH 28.0 26.0 - 34.0 pg   MCHC 33.3 30.0 - 36.0 g/dL   RDW 14.5 11.5 - 15.5 %   Platelets 236 150 - 400 K/uL  Lipid panel     Status: Abnormal   Collection Time: 07/25/15  2:44 AM  Result Value Ref Range   Cholesterol 90 0 - 200 mg/dL   Triglycerides 111 <150 mg/dL   HDL 26 (L) >40 mg/dL   Total CHOL/HDL Ratio 3.5 RATIO   VLDL 22 0 - 40 mg/dL   LDL Cholesterol 42 0 - 99 mg/dL     Comment:        Total Cholesterol/HDL:CHD Risk Coronary Heart Disease Risk Table                     Men   Women  1/2 Average Risk   3.4   3.3  Average Risk       5.0   4.4  2 X Average Risk   9.6   7.1  3 X Average Risk  23.4   11.0        Use the calculated Patient Ratio above and the CHD Risk Table to determine the patient's CHD Risk.        ATP III CLASSIFICATION (LDL):  <100     mg/dL   Optimal  100-129  mg/dL   Near or Above                    Optimal  130-159  mg/dL   Borderline  160-189  mg/dL   High  >190     mg/dL   Very High   Hepatic function panel     Status: Abnormal   Collection Time: 07/25/15  2:44 AM  Result Value Ref Range   Total Protein 5.0 (L) 6.5 - 8.1 g/dL   Albumin 2.0 (L) 3.5 - 5.0 g/dL   AST 70 (H) 15 - 41 U/L   ALT 39 17 - 63 U/L   Alkaline Phosphatase 56 38 - 126 U/L   Total Bilirubin 0.6 0.3 - 1.2 mg/dL   Bilirubin, Direct 0.2 0.1 - 0.5 mg/dL   Indirect Bilirubin 0.4 0.3 - 0.9 mg/dL  Glucose, capillary     Status: Abnormal   Collection Time: 07/25/15  5:37 AM  Result Value Ref Range   Glucose-Capillary 54 (L) 65 - 99 mg/dL  Glucose, capillary     Status: None   Collection Time: 07/25/15  6:00 AM  Result Value Ref Range   Glucose-Capillary 75 65 - 99 mg/dL  Glucose, capillary     Status: Abnormal   Collection Time: 07/25/15  8:37 AM  Result Value Ref Range   Glucose-Capillary 206 (H) 65 - 99 mg/dL  Glucose, capillary     Status: Abnormal   Collection Time: 07/25/15 12:02 PM  Result Value Ref Range   Glucose-Capillary 148 (H) 65 - 99 mg/dL  Glucose, capillary     Status: Abnormal   Collection Time: 07/25/15  4:46 PM  Result Value Ref Range   Glucose-Capillary 322 (H) 65 - 99 mg/dL  Glucose, capillary     Status: Abnormal   Collection Time: 07/25/15 10:04 PM  Result Value Ref Range   Glucose-Capillary 395 (H) 65 - 99 mg/dL  Cortisol-am, blood     Status: None   Collection Time: 07/26/15  5:40 AM  Result Value Ref Range   Cortisol -  AM 9.3 6.7 - 22.6 ug/dL  Protime-INR     Status: Abnormal   Collection Time: 07/26/15  5:40 AM  Result Value Ref Range   Prothrombin Time 36.0 (H) 11.6 - 15.2 seconds   INR 3.72 (H) 0.00 - 1.49  Comprehensive metabolic panel     Status: Abnormal   Collection Time: 07/26/15  5:40 AM  Result Value Ref Range   Sodium 137 135 - 145 mmol/L   Potassium 4.5 3.5 - 5.1 mmol/L   Chloride 111 101 - 111 mmol/L   CO2 19 (L) 22 - 32 mmol/L   Glucose, Bld 235 (H) 65 - 99 mg/dL   BUN 76 (H) 6 - 20 mg/dL   Creatinine, Ser 1.45 (H) 0.61 - 1.24 mg/dL   Calcium 8.0 (L) 8.9 - 10.3 mg/dL   Total Protein 5.0 (L) 6.5 - 8.1 g/dL   Albumin 1.8 (L) 3.5 - 5.0 g/dL   AST 53 (H) 15 - 41 U/L   ALT 41 17 - 63 U/L   Alkaline Phosphatase 31 (L) 38 - 126 U/L   Total Bilirubin 0.6 0.3 - 1.2 mg/dL   GFR calc non Af Amer 47 (L) >60 mL/min   GFR calc Af Amer 55 (L) >60 mL/min    Comment: (NOTE) The eGFR has been calculated using the CKD EPI equation. This calculation has not been validated in all clinical situations. eGFR's persistently <60 mL/min signify possible Chronic Kidney Disease.    Anion gap 7 5 - 15  Glucose, capillary     Status: Abnormal   Collection Time: 07/26/15  7:44 AM  Result Value Ref Range   Glucose-Capillary 215 (H) 65 - 99 mg/dL   Comment 1 Notify RN    Comment 2 Document in Chart   Troponin I (q 6hr x 3)  Status: Abnormal   Collection Time: 07/26/15  8:02 AM  Result Value Ref Range   Troponin I 0.37 (H) <0.031 ng/mL    Comment:        PERSISTENTLY INCREASED TROPONIN VALUES IN THE RANGE OF 0.04-0.49 ng/mL CAN BE SEEN IN:       -UNSTABLE ANGINA       -CONGESTIVE HEART FAILURE       -MYOCARDITIS       -CHEST TRAUMA       -ARRYHTHMIAS       -LATE PRESENTING MYOCARDIAL INFARCTION       -COPD   CLINICAL FOLLOW-UP RECOMMENDED.   CBC with Differential/Platelet     Status: Abnormal   Collection Time: 07/26/15  8:02 AM  Result Value Ref Range   WBC 22.7 (H) 4.0 - 10.5 K/uL   RBC  2.60 (L) 4.22 - 5.81 MIL/uL   Hemoglobin 7.4 (L) 13.0 - 17.0 g/dL   HCT 22.3 (L) 39.0 - 52.0 %   MCV 85.8 78.0 - 100.0 fL   MCH 28.5 26.0 - 34.0 pg   MCHC 33.2 30.0 - 36.0 g/dL   RDW 15.2 11.5 - 15.5 %   Platelets 176 150 - 400 K/uL   Neutrophils Relative % 90 %   Neutro Abs 20.4 (H) 1.7 - 7.7 K/uL   Lymphocytes Relative 5 %   Lymphs Abs 1.1 0.7 - 4.0 K/uL   Monocytes Relative 5 %   Monocytes Absolute 1.2 (H) 0.1 - 1.0 K/uL   Eosinophils Relative 0 %   Eosinophils Absolute 0.0 0.0 - 0.7 K/uL   Basophils Relative 0 %   Basophils Absolute 0.0 0.0 - 0.1 K/uL  Type and screen     Status: None (Preliminary result)   Collection Time: 07/26/15 11:30 AM  Result Value Ref Range   ABO/RH(D) A POS    Antibody Screen NEG    Sample Expiration 07/29/2015    Unit Number [id number]    Blood Component Type RED CELLS,LR    Unit division 00    Status of Unit ISSUED    Transfusion Status OK TO TRANSFUSE    Crossmatch Result Compatible   Glucose, capillary     Status: Abnormal   Collection Time: 07/26/15 12:09 PM  Result Value Ref Range   Glucose-Capillary 219 (H) 65 - 99 mg/dL  Troponin I (q 6hr x 3)     Status: Abnormal   Collection Time: 07/26/15 12:27 PM  Result Value Ref Range   Troponin I 0.30 (H) <0.031 ng/mL    Comment:        PERSISTENTLY INCREASED TROPONIN VALUES IN THE RANGE OF 0.04-0.49 ng/mL CAN BE SEEN IN:       -UNSTABLE ANGINA       -CONGESTIVE HEART FAILURE       -MYOCARDITIS       -CHEST TRAUMA       -ARRYHTHMIAS       -LATE PRESENTING MYOCARDIAL INFARCTION       -COPD   CLINICAL FOLLOW-UP RECOMMENDED.   CBC     Status: Abnormal   Collection Time: 07/26/15 12:27 PM  Result Value Ref Range   WBC 27.2 (H) 4.0 - 10.5 K/uL   RBC 2.59 (L) 4.22 - 5.81 MIL/uL   Hemoglobin 7.2 (L) 13.0 - 17.0 g/dL   HCT 22.2 (L) 39.0 - 52.0 %   MCV 85.7 78.0 - 100.0 fL   MCH 27.8 26.0 - 34.0 pg   MCHC 32.4 30.0 - 36.0  g/dL   RDW 15.4 11.5 - 15.5 %   Platelets 214 150 - 400 K/uL   DIC (disseminated intravasc coag) panel     Status: Abnormal   Collection Time: 07/26/15 12:27 PM  Result Value Ref Range   Prothrombin Time 33.5 (H) 11.6 - 15.2 seconds   INR 3.39 (H) 0.00 - 1.49   aPTT 64 (H) 24 - 37 seconds    Comment:        IF BASELINE aPTT IS ELEVATED, SUGGEST PATIENT RISK ASSESSMENT BE USED TO DETERMINE APPROPRIATE ANTICOAGULANT THERAPY.    Fibrinogen 582 (H) 204 - 475 mg/dL   D-Dimer, Quant <0.27 0.00 - 0.48 ug/mL-FEU    Comment:        AT THE INHOUSE ESTABLISHED CUTOFF VALUE OF 0.48 ug/mL FEU, THIS ASSAY HAS BEEN DOCUMENTED IN THE LITERATURE TO HAVE A SENSITIVITY AND NEGATIVE PREDICTIVE VALUE OF AT LEAST 98 TO 99%.  THE TEST RESULT SHOULD BE CORRELATED WITH AN ASSESSMENT OF THE CLINICAL PROBABILITY OF DVT / VTE.    Platelets 207 150 - 400 K/uL   Smear Review NO SCHISTOCYTES SEEN   Prepare RBC     Status: None   Collection Time: 07/26/15  1:57 PM  Result Value Ref Range   Order Confirmation ORDER PROCESSED BY BLOOD BANK   Glucose, capillary     Status: Abnormal   Collection Time: 07/26/15  4:41 PM  Result Value Ref Range   Glucose-Capillary 270 (H) 65 - 99 mg/dL    No results found.   Pertinent items are noted in HPI.  Blood pressure 94/25, pulse 83, temperature 97.8 F (36.6 C), temperature source Oral, resp. rate 20, height 5' 11"  (1.803 m), weight 59.5 kg (131 lb 2.8 oz), SpO2 100 %.  General appearance: alert, cooperative and appears stated age Head: Normocephalic, without obvious abnormality, atraumatic Neck: supple, symmetrical, trachea midline Extremities: intact senation and capillary refill except right ring finger.  wiggles all digits.  right ring with dry necrosis from pip distally.  no erythema or proximal streaking.  no sign of infection. Pulses: 2+ and symmetric Skin: Skin color, texture, turgor normal. No rashes or lesions Neurologic: Grossly normal Incision/Wound: Multiple small abrasions in upper  extremities.  Assessment/Plan Right ring finger dry gangrene.  Discussed with patient and his son that finger will not recover.  Not currently infected, so may be managed in non urgent manner.  Discussed leaving finger vs amputation to remove devitalized tissue and reduce risk of infection.  They wish to proceed with amputation.  Risks, benefits, and alternatives of surgery were discussed and the patient and his son agree with the plan of care.  Consent signed by son who is power of attorney.  Will plan amputation tomorrow afternoon.   KUZMA,KEVIN R 07/26/2015, 6:31 PM

## 2015-07-26 NOTE — Consult Note (Signed)
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 On 9/29, Pt with sustained hypotension, bradycardia with mildly elevated troponin. PCCM asked to consult.   SIGNIFICANT EVENTS  9/27> Pt admitted from ED with FTT, gangrene of digit, supratherapeutic INR  9/29> Pt with worsening hypotension, bradycardia, and anemia   STUDIES:  9/28 ECHO > EF 60-65%   HISTORY OF PRESENT ILLNESS:  See above   PAST MEDICAL HISTORY :   has a past medical history of Anemia; CAD (coronary artery disease); History of GI bleed; PAD (peripheral artery disease); Atrial fibrillation; Hypothyroidism; Hyperlipidemia; Hypertension; Chronic kidney disease; COPD (chronic obstructive pulmonary disease); Dehiscence of operative wound; H/O hiatal hernia; Type II diabetes mellitus; History of blood transfusion; GERD (gastroesophageal reflux disease); Stroke; and Depression.  has past surgical history that includes Femoral bypass (Right, 12/29/2002); Femoral bypass (Left, 12/01/2001); Below knee leg amputation (Right, 02/23/2006); Above knee leg amputaton (Right, 06/05/09); Leg amputation above knee (Right, 04/11/2010); Coronary artery bypass graft (2002); Below knee leg amputation (Left, 06/27/2011); Incision and drainage of wound (Right, 01/21/2003); Cardiac catheterization (08/2001); Drainage and closure of lymphocele (Left, 01/17/2002); Debridement and closure wound (Right, 02/01/2003); Femoral endarterectomy (Right, 07/12/2003); Femoropopliteal thrombectomy / embolectomy (Right, 01/29/2005); Femoral bypass  (Left, 09/09/2005); Femoropopliteal thrombectomy / embolectomy (Right, 02/06/2006); Axillary-femoral Bypass Graft (Right, 02/06/2006); Thrombectomy / embolectomy axillary artery (Right, 02/08/2006); Debridement  foot (Right, 02/12/2006); Wound debridement (Right, 03/30/2006); Debridement and closure wound (Right, 04/23/2006; 04/26/2006); and Transmetatarsal amputation (Left, 06/24/2011). Prior to Admission medications   Medication Sig Start Date End Date Taking? Authorizing Provider  acetaminophen (TYLENOL) 325 MG tablet Take 650 mg by mouth every 6 (six) hours as needed (pain).   Yes Historical Provider, MD  atorvastatin (LIPITOR) 80 MG tablet Take 80 mg by mouth daily.   Yes Historical Provider, MD  benazepril-hydrochlorthiazide (LOTENSIN HCT) 10-12.5 MG tablet Take 1 tablet by mouth daily. 05/30/15  Yes Historical Provider, MD  diazepam (VALIUM) 10 MG tablet Take 5 mg by mouth daily as needed for anxiety.   Yes Historical Provider, MD  digoxin (LANOXIN) 0.125 MG tablet Take 125 mcg by mouth daily.     Yes Historical Provider, MD  diltiazem (CARDIZEM CD) 180 MG 24 hr capsule Take 1 capsule (180 mg total) by mouth daily. 02/24/13  Yes Adrian Prince, MD  Docusate Calcium (STOOL SOFTENER PO) Take 1 tablet by mouth daily. 05/30/15  Yes Historical Provider, MD  fenofibrate 160 MG tablet Take 160 mg by mouth daily.     Yes Historical Provider, MD  furosemide (LASIX) 40 MG tablet Take 40 mg by mouth daily. 05/08/15  Yes Historical Provider, MD  glimepiride (AMARYL) 4 MG tablet Take 2 mg by mouth daily. 05/23/15  Yes Historical Provider, MD  insulin regular human CONCENTRATED (HUMULIN R) 500 UNIT/ML SOLN injection Inject 6 Units into the skin daily.    Yes Historical Provider, MD  levothyroxine (SYNTHROID, LEVOTHROID) 50 MCG tablet Take 50 mcg by mouth daily.     Yes Historical Provider, MD  metoprolol (LOPRESSOR) 50 MG tablet Take 25 mg  by mouth 2 (two) times daily.    Yes Historical Provider, MD  Multiple Vitamin  (MULTIVITAMIN) tablet Take 1 tablet by mouth daily.     Yes Historical Provider, MD  silver sulfADIAZINE (SILVADENE) 1 % cream Apply 1 application topically daily. 08/18/14  Yes Adrian Prince, MD  warfarin (COUMADIN) 3 MG tablet Take 1.5-3 mg by mouth daily. Take 3 mg by mouth on Monday and Friday. Take 1.5 mg by mouth on all other days.   Yes Historical Provider, MD   No Known Allergies  FAMILY HISTORY:  family history is not on file. SOCIAL HISTORY:  reports that he has been smoking Cigarettes.  He has a 58 pack-year smoking history. He has never used smokeless tobacco. He reports that he drinks alcohol. He reports that he does not use illicit drugs.  REVIEW OF SYSTEMS:   Unable to obtain due to mental status.  SUBJECTIVE:  Chronically ill appearing male in NAD.   VITAL SIGNS: Temp:  [97.3 F (36.3 C)-98.6 F (37 C)] 97.3 F (36.3 C) (09/29 0742) Pulse Rate:  [38-93] 45 (09/29 0930) Resp:  [13-20] 17 (09/29 0930) BP: (65-101)/(23-74) 84/67 mmHg (09/29 0930) SpO2:  [94 %-100 %] 100 % (09/29 0500) Weight:  [59.5 kg (131 lb 2.8 oz)] 59.5 kg (131 lb 2.8 oz) (09/29 0500)  PHYSICAL EXAMINATION: General:  Chronically ill appearing male in NAD.  Neuro:  Alert. Oriented x 1. Follows commands  HEENT:  NCAT. MMM  Cardiovascular:  Irregular  Lungs:  Lungs diminished bilat. crackles noted on Rt base  Abdomen:  Soft, non-distended. Non-tender  Musculoskeletal:  Intact, bilat BKA  Skin: Multiple sacral wounds noted to sacral area, no purulent drainage. Gangrenous Rt fourth digit.    Recent Labs Lab 07/24/15 1151 07/25/15 0244 07/26/15 0540  NA 126* 132* 137  K 4.5 4.1 4.5  CL 91* 102 111  CO2 23 21* 19*  BUN 127* 95* 76*  CREATININE 2.22* 1.52* 1.45*  GLUCOSE 474* 59* 235*    Recent Labs Lab 07/24/15 1151 07/25/15 0244 07/26/15 0802  HGB 11.4* 9.1* 7.4*  HCT 32.9* 27.3* 22.3*  WBC 23.9* 21.2* 22.7*  PLT 245 236 176    Recent Labs Lab 07/24/15 1139 07/24/15 1151  07/24/15 1433 07/24/15 1928 07/25/15 0244 07/26/15 0802  WBC  --  23.9*  --   --  21.2* 22.7*  LATICACIDVEN 3.08*  --  2.00 1.1  --   --    Dg Chest 1 View  07/24/2015   CLINICAL DATA:  INFECTION RIGHT RING FINGER.UNABLE TO SIT UP FOR LATERAL VIEW/DECUB SORE  EXAM: CHEST 1 VIEW  COMPARISON:  08/15/2014  FINDINGS: Status post median sternotomy and CABG. Heart size is normal. There are no focal consolidations or pleural effusions. No pulmonary edema.  IMPRESSION: No evidence for acute cardiopulmonary abnormality.   Electronically Signed   By: Norva Pavlov M.D.   On: 07/24/2015 13:08   Dg Finger Ring Right  07/24/2015   CLINICAL DATA:  Right finger gangrene.  EXAM: RIGHT RING FINGER 2+V  COMPARISON:  None.  FINDINGS: No fracture or dislocation is noted. Vascular calcifications are noted. Soft tissue gas is noted around the fourth distal phalanx suggesting infection or cellulitis. No definite lytic destruction is seen to suggest osteomyelitis at this time.  IMPRESSION: Findings consistent with cellulitis or infection of the distal tuft soft tissues of fourth finger. No definite bony abnormality is noted.   Electronically Signed   By: Lupita Raider, M.D.  On: 07/24/2015 13:08    ASSESSMENT / PLAN:  CARDIOVASCULAR A: Hypotension. Etiology unclear Atrial fibrillation [place] Demand ischemia Does meet sepsis criteria, however his is bradycardic on rate controlling meds that could exacerbate hypotension. So this would add symptomatic bradycardia vs simply hypotension d/t his medications to list of potential causes of hypotension as well. The fact that his creatinine has improved would argue against this being shock P: Hold all antihypertensives and diuretics Repeat lactic acid Cont crystalloid resuscitation  See sepsis plan below rx relative adrenal insufficiency   ID A: Sepsis: Gangrenous Rt 4th digit- only obvious source; also has sacral wound P: BCx2 9/27>>  Cont Vanc/Zosyn    Vascular surgery following   RENAL A: AKI Metabolic acidosis> Primarily non-AG acidosis in setting of AKI vs. GI loss   P:  Change IVF To LR  Monitor UO Monitor Bmet  In am   GI A:  Malnourishment in setting of FTT  P: Supplement diet per dietitian   HEME A: Anemia : H&H drop from 9.1 to 7.4 since 9/29 with no obvious source of bleeding.  Supratherapeutic INR: increased to 3.72 from 8.09 on 9/28 P: Transfuse 1 U PRBCs  Fecal Occult Blood  Trend CBC  Coumadin per pharmacy  Hold FFP/Vit K for now- may need if INR increases  Follow coags/INR   ENDOCRINE A: Relative adrenal insufficiency  Hypothyroidism  Hyperglycemia w/ known DM P Change to solucortef 50 mg q 6 Cont CBGs and SSI  Cont Synthroid   Neuro A: Dementia, w/ failure to thrive: dependent for all ADLs P:  Supportive care  Hold sedative medications   Summary:  Pt with complicated PMH asked to be seen d/t sepsis, etiology unclear. Hold all anti-hypertensive medications, cont crystolloid fluids, transfuse 1U PRBCs. Continue Vanc/Zosyn, change IVF to [facility], and rx [facility]   Pulmonary and Critical Care Medicine [facility] Pager: [phone]  07/26/2015, 11:22 AM

## 2015-07-26 NOTE — Progress Notes (Signed)
VASCULAR SURGERY  Right Upper extremity duplex and bilateral upper extremity Doppler studies are still pending.  Waverly Ferrari, MD, FACS Beeper [phone] Office: [phone]

## 2015-07-26 NOTE — Care Management Note (Signed)
Case Management Note  Patient Details  Name: [name] MRN: [id number] Date of Birth: 1946-09-12  Subjective/Objective:       Pt lives with son, Orvilla Fus [phone], who is primary caregiver, is also active with  [facility] for nursing services.              Action/Plan: CM to follow for discharge needs.               Expected Discharge Plan:  Home w Home Health Services  Discharge planning Services  CM Consult  HH Arranged:  RN [facility] Agency:  [facility]  Status of Service:  In process, will continue to follow  Magdalene River, RN 07/26/2015, 2:36 PM

## 2015-07-26 NOTE — Progress Notes (Signed)
*  PRELIMINARY RESULTS* Vascular Ultrasound Bilateral upper extremity arterial doppler and right upper extremity arterial duplex has been completed. Studies were technically difficult and limited due to patient position, movement, and poor patient cooperation.  ARTERIAL DOPPLER   RIGHT    LEFT    PRESSURE WAVEFORM  PRESSURE WAVEFORM  BRACHIAL 106 Monophasic BRACHIAL Noncompressible Triphasic  Radial 70 Monophasic Radial  Unable to obtain  Ulnar 83 Monophasic Ulnar  Unable to obtain    RIGHT LEFT  Wrist/Brachial Index 0.78 Unable to calculate    Right upper extremity arterial duplex There is evidence of elevated right distal brachial artery peak systolic velocities as high as 876cm/s, suggestive of a high grade stenosis. Distal right radial and ulnar arteries exhibit dampened monophasic waveforms.  07/26/2015 2:00 PM Gertie Fey, RVT, RDCS, RDMS

## 2015-07-26 NOTE — Progress Notes (Signed)
Family Med MD paged with 92/35 MAP 48 after 1,085ml bolus. No changes at this time, will monitor.
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
## 2015-07-26 NOTE — Progress Notes (Signed)
   VASCULAR SURGERY ASSESSMENT & PLAN:  * Results of bilateral upper extremity arterial Doppler study and right upper extremity arterial duplex study were reviewed. Patient has diffuse disease in the right upper extremity with evidence of a distal brachial artery stenosis. He is a poor candidate for revascularization.  * I would recommend hand surgery consultation for amputation of the right ring finger. If this did not heal and he would likely require more proximal amputation is there are really no good options for revascularization.    SUBJECTIVE: No specific complaints.  PHYSICAL EXAM: Filed Vitals:   07/26/15 0742 07/26/15 0900 07/26/15 0930 07/26/15 1138  BP: 87/61  Pulse: 38 73 45 83  Temp: 97.3 F (36.3 C)   97.5 F (36.4 C)  TempSrc: Oral   Oral  Resp: Height:      Weight:      SpO2:    100%   Dry gangrene of the right ring finger is unchanged.  LABS: Lab Results  Component Value Date   WBC 27.2* 07/26/2015   HGB 7.2* 07/26/2015   HCT 22.2* 07/26/2015   MCV 85.7 07/26/2015   PLT 214 07/26/2015   PLT 207 07/26/2015   Lab Results  Component Value Date   CREATININE 1.45* 07/26/2015   Lab Results  Component Value Date   INR 3.39* 07/26/2015   CBG (last 3)   Recent Labs  07/25/15 2204 07/26/15 0744 07/26/15 1209  GLUCAP 395* 215* 219*    Active Problems:   FTT (failure to thrive) in adult   Hypotension   Sepsis   Pressure ulcer   Dry gangrene   Blood poisoning  BILATERAL UPPER EXTREMITY ARTERIAL DOPPLER STUDY: He has monophasic Doppler signals in the right radial and ulnar positions with a wrist to brachial index of 0.78. He has a monophasic brachial waveform on the right.  RIGHT UPPER EXTREMITY ARTERIAL DUPLEX: His markedly elevated velocities in the distal brachial artery suggesting a significant stenosis. However the disease appears to be fairly diffuse.   Cari Caraway Beeper: [phone] 07/26/2015

## 2015-07-26 NOTE — Progress Notes (Signed)
ANTICOAGULATION CONSULT NOTE - Follow Up Consult  Pharmacy Consult: Coumadin Indication: atrial fibrillation  No Known Allergies  Patient Measurements: Height:  (180.3 cm) (prior to amputation) Weight: 131 lb 2.8 oz (59.5 kg) IBW/kg (Calculated) : 75.3  Vital Signs: Temp: 97.3 F (36.3 C) (09/29 0742) Temp Source: Oral (09/29 0742) BP: 84/51 mmHg (09/29 0900) Pulse Rate: 73 (09/29 0900)  Labs:  Recent Labs  07/24/15 1151 07/25/15 0244 07/26/15 0540 07/26/15 0802  HGB 11.4* 9.1*  --  7.4*  HCT 32.9* 27.3*  --  22.3*  PLT 245 236  --  176  LABPROT 47.1* 64.5* 36.0*  --   INR 5.32* 8.09* 3.72*  --   CREATININE 2.22* 1.52* 1.45*  --   TROPONINI  --   --   --  0.37*    Estimated Creatinine Clearance: 39.9 mL/min (by C-G formula based on Cr of 1.45).   Assessment: 69 YOM on Coumadin PTA for history of AFib.  Patient's INR was supra-therapeutic on admit and Coumadin has been held. S/p reversal with vit K 2.5mg  po 9/28 after INR increased to 8.09. INR now down to  today and Vitamin K 2.5mg  PO was given. H/H down this a.m. (noted pt with extra bolus so component of decrease may be dilutional). Patient has a history of GIB, but no bleeding has been reported.  Goal of Therapy:  INR 2-3 Vanc trough 15-20 mcg/mL  Plan:  - Continue to hold Coumadin - likely can restart tomorrow - Daily PT / INR - Vanc  IV Q24H - Zosyn 3.375gm IV Q8H, 4 hr infusion - Monitor renal fxn, clinical progress, vanc trough at Css  Christoper Fabian, PharmD, BCPS Clinical pharmacist, pager [phone] 07/26/2015, 9:40 AM

## 2015-07-26 NOTE — Progress Notes (Signed)
MD paged about pts BP, pt also transferred to an air mattress bed. MD in to see pt.

## 2015-07-26 NOTE — Consult Note (Signed)
Reason for Consult: Minimally elevated troponin I/hypotension and bradycardia Referring Physician: Teaching service  [name] is an 69 y.o. male.  HPI: Patient is 69 year old male with past medical history significant for multiple medical problems i.e. coronary artery disease history of MI in the past, hypertension, chronic atrial fibrillation, history of possible cardioembolic CVA in the past, diabetes mellitus, peripheral vascular disease status post right AKA and left BKA, hyperlipidemia, hypothyroidism, chronic kidney disease, COPD, GERD, was admitted because of dry gangrene of right fourth finger, secondary burns and skin breakdown associated with hypotension and failure to thrive. Cardiologic consultation is called as patient was noted to be hypotensive bradycardic and minimally elevated troponin I. EKG done showed chronic atrial fibrillation with slow vent response with old inferior wall MI and right bundle branch block with secondary T-wave changes. Patient presently denies any chest pain or shortness of breath denies dizziness or lightheadedness. No further meaningful history could be obtained. Diltiazem drip has been DC'd because of hypotension and bradycardia. Patient was also noted to have supra therapeutic INR Coumadin has been held and INR is trending down.  Past Medical History  Diagnosis Date  . Anemia   . CAD (coronary artery disease)   . History of GI bleed   . PAD (peripheral artery disease)   . Atrial fibrillation   . Hypothyroidism   . Hyperlipidemia   . Hypertension   . Chronic kidney disease     chronic renal insufficiency  . COPD (chronic obstructive pulmonary disease)   . Dehiscence of operative wound   . H/O hiatal hernia   . Type II diabetes mellitus   . History of blood transfusion     "some; related to surgeries & LGIB"  . GERD (gastroesophageal reflux disease)   . Stroke     "he's had 6 mini strokes/last CT scan, probably more since; stroke in right eye,  can't see out of it since" (08/15/2014)  . Depression     Past Surgical History  Procedure Laterality Date  . Femoral bypass Right 12/29/2002    w/ Goretex   by Dr. Tinnie Gens  . Femoral bypass Left 12/01/2001    Archie Endo 03/11/2011  . 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 Above knee leg amputation Right 06/05/09  . Leg amputation above knee Right 04/11/2010    Revision of right above knee amputation  . Coronary artery bypass graft  2002    CABG X4; By Servando Snare  . Below knee leg amputation Left 06/27/2011    Archie Endo 06/27/2011  . Incision and drainage of wound Right 01/21/2003    distal thigh and popliteal space/notes 03/11/2011  . Cardiac catheterization  08/2001    Archie Endo 03/11/2011  . Drainage and closure of lymphocele Left 01/17/2002    Archie Endo 03/11/2011  . Debridement and closure wound Right 02/01/2003    partial closure of distal thigh & popliteal wound/notes 03/11/2011  . Femoral endarterectomy Right 07/12/2003    Archie Endo 03/11/2011  . Femoropopliteal thrombectomy / embolectomy Right 01/29/2005    Archie Endo 03/11/2011  . Femoral bypass Left 09/09/2005    Archie Endo 03/11/2011  . Femoropopliteal thrombectomy / embolectomy Right 02/06/2006    Archie Endo 03/11/2011  . Axillary-femoral bypass graft Right 02/06/2006    Archie Endo 03/11/2011  . Thrombectomy / embolectomy axillary artery Right 02/08/2006    Archie Endo 03/11/2011  . Debridement  foot Right 02/12/2006    & amputation right third toe/notes 03/11/2011  . Wound debridement Right 03/30/2006  BTK amputation/notes 03/11/2011  . Debridement and closure wound Right 04/23/2006; 04/26/2006    closure of BTK amputation/notes 03/11/2011  . Transmetatarsal amputation Left 06/24/2011    "#3, 4, & 5/notes 06/26/2011    History reviewed. No pertinent family history.  Social History:  reports that he has been smoking Cigarettes.  He has a 58 pack-year smoking history. He has never used smokeless tobacco. He reports that he drinks alcohol. He  reports that he does not use illicit drugs.  Allergies: No Known Allergies  Medications: I have reviewed the patient's current medications.  Results for orders placed or performed during the hospital encounter of 07/24/15 (from the past 48 hour(s))  Culture, blood (routine x 2)     Status: None (Preliminary result)   Collection Time: 07/24/15 11:25 AM  Result Value Ref Range   Specimen Description BLOOD RIGHT ANTECUBITAL    Special Requests BOTTLES DRAWN AEROBIC AND ANAEROBIC 5ML    Culture NO GROWTH 1 DAY    Report Status PENDING   Culture, blood (routine x 2)     Status: None (Preliminary result)   Collection Time: 07/24/15 11:30 AM  Result Value Ref Range   Specimen Description BLOOD FOREARM RIGHT    Special Requests BOTTLES DRAWN AEROBIC AND ANAEROBIC 5ML    Culture NO GROWTH 1 DAY    Report Status PENDING   I-Stat CG4 Lactic Acid, ED     Status: Abnormal   Collection Time: 07/24/15 11:39 AM  Result Value Ref Range   Lactic Acid, Venous 3.08 (HH) 0.5 - 2.0 mmol/L   Comment NOTIFIED PHYSICIAN   Magnesium     Status: None   Collection Time: 07/24/15 11:45 AM  Result Value Ref Range   Magnesium 2.4 1.7 - 2.4 mg/dL  CBC with Differential     Status: Abnormal   Collection Time: 07/24/15 11:51 AM  Result Value Ref Range   WBC 23.9 (H) 4.0 - 10.5 K/uL   RBC 3.86 (L) 4.22 - 5.81 MIL/uL   Hemoglobin 11.4 (L) 13.0 - 17.0 g/dL   HCT 32.9 (L) 39.0 - 52.0 %   MCV 85.2 78.0 - 100.0 fL   MCH 29.5 26.0 - 34.0 pg   MCHC 34.7 30.0 - 36.0 g/dL   RDW 14.5 11.5 - 15.5 %   Platelets 245 150 - 400 K/uL   Neutrophils Relative % 84 %   Neutro Abs 20.2 (H) 1.7 - 7.7 K/uL   Lymphocytes Relative 7 %   Lymphs Abs 1.6 0.7 - 4.0 K/uL   Monocytes Relative 9 %   Monocytes Absolute 2.1 (H) 0.1 - 1.0 K/uL   Eosinophils Relative 0 %   Eosinophils Absolute 0.0 0.0 - 0.7 K/uL   Basophils Relative 0 %   Basophils Absolute 0.0 0.0 - 0.1 K/uL  Basic metabolic panel     Status: Abnormal   Collection  Time: 07/24/15 11:51 AM  Result Value Ref Range   Sodium 126 (L) 135 - 145 mmol/L   Potassium 4.5 3.5 - 5.1 mmol/L   Chloride 91 (L) 101 - 111 mmol/L   CO2 23 22 - 32 mmol/L   Glucose, Bld 474 (H) 65 - 99 mg/dL   BUN 127 (H) 6 - 20 mg/dL   Creatinine, Ser 2.22 (H) 0.61 - 1.24 mg/dL   Calcium 9.0 8.9 - 10.3 mg/dL   GFR calc non Af Amer 28 (L) >60 mL/min   GFR calc Af Amer 33 (L) >60 mL/min    Comment: (NOTE) The  eGFR has been calculated using the CKD EPI equation. This calculation has not been validated in all clinical situations. eGFR's persistently <60 mL/min signify possible Chronic Kidney Disease.    Anion gap 12 5 - 15  Protime-INR     Status: Abnormal   Collection Time: 07/24/15 11:51 AM  Result Value Ref Range   Prothrombin Time 47.1 (H) 11.6 - 15.2 seconds    Comment: SPECIMEN CHECKED FOR CLOTS REPEATED TO VERIFY    INR 5.32 (HH) 0.00 - 1.49    Comment: SPECIMEN CHECKED FOR CLOTS REPEATED TO VERIFY CRITICAL RESULT CALLED TO, READ BACK BY AND VERIFIED WITH: Ocie Bob RN [id number] 07/24/2015 BY MACEDA, J   Urine culture     Status: None   Collection Time: 07/24/15 12:23 PM  Result Value Ref Range   Specimen Description URINE, CATHETERIZED    Special Requests NONE    Culture NO GROWTH 1 DAY    Report Status 07/25/2015 FINAL   Urinalysis, Routine w reflex microscopic (not at [facility])     Status: Abnormal   Collection Time: 07/24/15 12:23 PM  Result Value Ref Range   Color, Urine YELLOW YELLOW   APPearance CLEAR CLEAR   Specific Gravity, Urine 1.017 1.005 - 1.030   pH 5.0 5.0 - 8.0   Glucose, UA >1000 (A) NEGATIVE mg/dL   Hgb urine dipstick MODERATE (A) NEGATIVE   Bilirubin Urine NEGATIVE NEGATIVE   Ketones, ur NEGATIVE NEGATIVE mg/dL   Protein, ur NEGATIVE NEGATIVE mg/dL   Urobilinogen, UA 0.2 0.0 - 1.0 mg/dL   Nitrite NEGATIVE NEGATIVE   Leukocytes, UA MODERATE (A) NEGATIVE  Urine microscopic-add on     Status: Abnormal   Collection Time: 07/24/15 12:23 PM   Result Value Ref Range   Squamous Epithelial / LPF FEW (A) RARE   WBC, UA 7-10 <3 WBC/hpf   RBC / HPF 3-6 <3 RBC/hpf   Bacteria, UA FEW (A) RARE   Casts HYALINE CASTS (A) NEGATIVE  I-Stat CG4 Lactic Acid, ED     Status: None   Collection Time: 07/24/15  2:33 PM  Result Value Ref Range   Lactic Acid, Venous 2.00 0.5 - 2.0 mmol/L   Comment NOTIFIED PHYSICIAN   Sedimentation rate     Status: Abnormal   Collection Time: 07/24/15  5:08 PM  Result Value Ref Range   Sed Rate 95 (H) 0 - 16 mm/hr  C-reactive protein     Status: Abnormal   Collection Time: 07/24/15  5:08 PM  Result Value Ref Range   CRP 13.6 (H) <1.0 mg/dL  Glucose, capillary     Status: Abnormal   Collection Time: 07/24/15  5:41 PM  Result Value Ref Range   Glucose-Capillary 350 (H) 65 - 99 mg/dL  MRSA PCR Screening     Status: None   Collection Time: 07/24/15  6:50 PM  Result Value Ref Range   MRSA by PCR NEGATIVE NEGATIVE    Comment:        The GeneXpert MRSA Assay (FDA approved for NASAL specimens only), is one component of a comprehensive MRSA colonization surveillance program. It is not intended to diagnose MRSA infection nor to guide or monitor treatment for MRSA infections.   TSH     Status: None   Collection Time: 07/24/15  7:28 PM  Result Value Ref Range   TSH 0.575 0.350 - 4.500 uIU/mL  Hemoglobin A1c     Status: Abnormal   Collection Time: 07/24/15  7:28 PM  Result Value Ref  Range   Hgb A1c MFr Bld 8.3 (H) 4.8 - 5.6 %    Comment: (NOTE)         Pre-diabetes: 5.7 - 6.4         Diabetes: >6.4         Glycemic control for adults with diabetes: <7.0    Mean Plasma Glucose 192 mg/dL    Comment: (NOTE) Performed At: [facility] [place], [place] [id number] Lindon Romp MD NK:[id number]   Digoxin level     Status: None   Collection Time: 07/24/15  7:28 PM  Result Value Ref Range   Digoxin Level 1.6 0.8 - 2.0 ng/mL  Lactic acid, plasma     Status: None   Collection  Time: 07/24/15  7:28 PM  Result Value Ref Range   Lactic Acid, Venous 1.1 0.5 - 2.0 mmol/L  Glucose, capillary     Status: Abnormal   Collection Time: 07/24/15  8:35 PM  Result Value Ref Range   Glucose-Capillary 304 (H) 65 - 99 mg/dL  Protime-INR     Status: Abnormal   Collection Time: 07/25/15  2:44 AM  Result Value Ref Range   Prothrombin Time 64.5 (H) 11.6 - 15.2 seconds   INR 8.09 (HH) 0.00 - 1.49    Comment: CRITICAL RESULT CALLED TO, READ BACK BY AND VERIFIED WITH: FAULKNER,B RN 07/25/2015 0441 JORDANS REPEATED TO VERIFY SPECIMEN CHECKED FOR CLOTS   Basic metabolic panel     Status: Abnormal   Collection Time: 07/25/15  2:44 AM  Result Value Ref Range   Sodium 132 (L) 135 - 145 mmol/L   Potassium 4.1 3.5 - 5.1 mmol/L   Chloride 102 101 - 111 mmol/L   CO2 21 (L) 22 - 32 mmol/L   Glucose, Bld 59 (L) 65 - 99 mg/dL   BUN 95 (H) 6 - 20 mg/dL   Creatinine, Ser 1.52 (H) 0.61 - 1.24 mg/dL   Calcium 8.0 (L) 8.9 - 10.3 mg/dL   GFR calc non Af Amer 45 (L) >60 mL/min   GFR calc Af Amer 52 (L) >60 mL/min    Comment: (NOTE) The eGFR has been calculated using the CKD EPI equation. This calculation has not been validated in all clinical situations. eGFR's persistently <60 mL/min signify possible Chronic Kidney Disease.    Anion gap 9 5 - 15  CBC     Status: Abnormal   Collection Time: 07/25/15  2:44 AM  Result Value Ref Range   WBC 21.2 (H) 4.0 - 10.5 K/uL   RBC 3.25 (L) 4.22 - 5.81 MIL/uL   Hemoglobin 9.1 (L) 13.0 - 17.0 g/dL    Comment: DELTA CHECK NOTED REPEATED TO VERIFY    HCT 27.3 (L) 39.0 - 52.0 %   MCV 84.0 78.0 - 100.0 fL   MCH 28.0 26.0 - 34.0 pg   MCHC 33.3 30.0 - 36.0 g/dL   RDW 14.5 11.5 - 15.5 %   Platelets 236 150 - 400 K/uL  Lipid panel     Status: Abnormal   Collection Time: 07/25/15  2:44 AM  Result Value Ref Range   Cholesterol 90 0 - 200 mg/dL   Triglycerides 111 <150 mg/dL   HDL 26 (L) >40 mg/dL   Total CHOL/HDL Ratio 3.5 RATIO   VLDL 22 0 - 40  mg/dL   LDL Cholesterol 42 0 - 99 mg/dL    Comment:        Total Cholesterol/HDL:CHD Risk Coronary Heart Disease Risk  Table                     Men   Women  1/2 Average Risk   3.4   3.3  Average Risk       5.0   4.4  2 X Average Risk   9.6   7.1  3 X Average Risk  23.4   11.0        Use the calculated Patient Ratio above and the CHD Risk Table to determine the patient's CHD Risk.        ATP III CLASSIFICATION (LDL):  <100     mg/dL   Optimal  100-129  mg/dL   Near or Above                    Optimal  130-159  mg/dL   Borderline  160-189  mg/dL   High  >190     mg/dL   Very High   Hepatic function panel     Status: Abnormal   Collection Time: 07/25/15  2:44 AM  Result Value Ref Range   Total Protein 5.0 (L) 6.5 - 8.1 g/dL   Albumin 2.0 (L) 3.5 - 5.0 g/dL   AST 70 (H) 15 - 41 U/L   ALT 39 17 - 63 U/L   Alkaline Phosphatase 56 38 - 126 U/L   Total Bilirubin 0.6 0.3 - 1.2 mg/dL   Bilirubin, Direct 0.2 0.1 - 0.5 mg/dL   Indirect Bilirubin 0.4 0.3 - 0.9 mg/dL  Glucose, capillary     Status: Abnormal   Collection Time: 07/25/15  5:37 AM  Result Value Ref Range   Glucose-Capillary 54 (L) 65 - 99 mg/dL  Glucose, capillary     Status: None   Collection Time: 07/25/15  6:00 AM  Result Value Ref Range   Glucose-Capillary 75 65 - 99 mg/dL  Glucose, capillary     Status: Abnormal   Collection Time: 07/25/15  8:37 AM  Result Value Ref Range   Glucose-Capillary 206 (H) 65 - 99 mg/dL  Glucose, capillary     Status: Abnormal   Collection Time: 07/25/15 12:02 PM  Result Value Ref Range   Glucose-Capillary 148 (H) 65 - 99 mg/dL  Glucose, capillary     Status: Abnormal   Collection Time: 07/25/15  4:46 PM  Result Value Ref Range   Glucose-Capillary 322 (H) 65 - 99 mg/dL  Glucose, capillary     Status: Abnormal   Collection Time: 07/25/15 10:04 PM  Result Value Ref Range   Glucose-Capillary 395 (H) 65 - 99 mg/dL  Cortisol-am, blood     Status: None   Collection Time: 07/26/15   5:40 AM  Result Value Ref Range   Cortisol - AM 9.3 6.7 - 22.6 ug/dL  Protime-INR     Status: Abnormal   Collection Time: 07/26/15  5:40 AM  Result Value Ref Range   Prothrombin Time 36.0 (H) 11.6 - 15.2 seconds   INR 3.72 (H) 0.00 - 1.49  Comprehensive metabolic panel     Status: Abnormal   Collection Time: 07/26/15  5:40 AM  Result Value Ref Range   Sodium 137 135 - 145 mmol/L   Potassium 4.5 3.5 - 5.1 mmol/L   Chloride 111 101 - 111 mmol/L   CO2 19 (L) 22 - 32 mmol/L   Glucose, Bld 235 (H) 65 - 99 mg/dL   BUN 76 (H) 6 - 20 mg/dL   Creatinine, Ser 1.45 (H)  0.61 - 1.24 mg/dL   Calcium 8.0 (L) 8.9 - 10.3 mg/dL   Total Protein 5.0 (L) 6.5 - 8.1 g/dL   Albumin 1.8 (L) 3.5 - 5.0 g/dL   AST 53 (H) 15 - 41 U/L   ALT 41 17 - 63 U/L   Alkaline Phosphatase 31 (L) 38 - 126 U/L   Total Bilirubin 0.6 0.3 - 1.2 mg/dL   GFR calc non Af Amer 47 (L) >60 mL/min   GFR calc Af Amer 55 (L) >60 mL/min    Comment: (NOTE) The eGFR has been calculated using the CKD EPI equation. This calculation has not been validated in all clinical situations. eGFR's persistently <60 mL/min signify possible Chronic Kidney Disease.    Anion gap 7 5 - 15  Glucose, capillary     Status: Abnormal   Collection Time: 07/26/15  7:44 AM  Result Value Ref Range   Glucose-Capillary 215 (H) 65 - 99 mg/dL   Comment 1 Notify RN    Comment 2 Document in Chart   Troponin I (q 6hr x 3)     Status: Abnormal   Collection Time: 07/26/15  8:02 AM  Result Value Ref Range   Troponin I 0.37 (H) <0.031 ng/mL    Comment:        PERSISTENTLY INCREASED TROPONIN VALUES IN THE RANGE OF 0.04-0.49 ng/mL CAN BE SEEN IN:       -UNSTABLE ANGINA       -CONGESTIVE HEART FAILURE       -MYOCARDITIS       -CHEST TRAUMA       -ARRYHTHMIAS       -LATE PRESENTING MYOCARDIAL INFARCTION       -COPD   CLINICAL FOLLOW-UP RECOMMENDED.   CBC with Differential/Platelet     Status: Abnormal   Collection Time: 07/26/15  8:02 AM  Result Value Ref  Range   WBC 22.7 (H) 4.0 - 10.5 K/uL   RBC 2.60 (L) 4.22 - 5.81 MIL/uL   Hemoglobin 7.4 (L) 13.0 - 17.0 g/dL   HCT 22.3 (L) 39.0 - 52.0 %   MCV 85.8 78.0 - 100.0 fL   MCH 28.5 26.0 - 34.0 pg   MCHC 33.2 30.0 - 36.0 g/dL   RDW 15.2 11.5 - 15.5 %   Platelets 176 150 - 400 K/uL   Neutrophils Relative % 90 %   Neutro Abs 20.4 (H) 1.7 - 7.7 K/uL   Lymphocytes Relative 5 %   Lymphs Abs 1.1 0.7 - 4.0 K/uL   Monocytes Relative 5 %   Monocytes Absolute 1.2 (H) 0.1 - 1.0 K/uL   Eosinophils Relative 0 %   Eosinophils Absolute 0.0 0.0 - 0.7 K/uL   Basophils Relative 0 %   Basophils Absolute 0.0 0.0 - 0.1 K/uL    Dg Chest 1 View  07/24/2015   CLINICAL DATA:  INFECTION RIGHT RING FINGER.UNABLE TO SIT UP FOR LATERAL VIEW/DECUB SORE  EXAM: CHEST 1 VIEW  COMPARISON:  08/15/2014  FINDINGS: Status post median sternotomy and CABG. Heart size is normal. There are no focal consolidations or pleural effusions. No pulmonary edema.  IMPRESSION: No evidence for acute cardiopulmonary abnormality.   Electronically Signed   By: Nolon Nations M.D.   On: 07/24/2015 13:08   Dg Finger Ring Right  07/24/2015   CLINICAL DATA:  Right finger gangrene.  EXAM: RIGHT RING FINGER 2+V  COMPARISON:  None.  FINDINGS: No fracture or dislocation is noted. Vascular calcifications are noted. Soft tissue gas is  noted around the fourth distal phalanx suggesting infection or cellulitis. No definite lytic destruction is seen to suggest osteomyelitis at this time.  IMPRESSION: Findings consistent with cellulitis or infection of the distal tuft soft tissues of fourth finger. No definite bony abnormality is noted.   Electronically Signed   By: Marijo Conception, M.D.   On: 07/24/2015 13:08    Review of Systems  Respiratory: Negative for shortness of breath.   Cardiovascular: Negative for chest pain and palpitations.  Gastrointestinal: Negative for nausea and vomiting.   Blood pressure 84/67, pulse 45, temperature 97.3 F (36.3 C),  temperature source Oral, resp. rate 17, height 5' 11" (1.803 m), weight 59.5 kg (131 lb 2.8 oz), SpO2 100 %. Physical Exam  Eyes: Conjunctivae are normal. Left eye exhibits no discharge. No scleral icterus.  Neck: Normal range of motion. Neck supple. No JVD present. No tracheal deviation present. No thyromegaly present.  Cardiovascular:  Irregularly irregular S1 and S2 soft there is 2/6 systolic murmur noted  Respiratory:  Decreased breath sound at bases  GI: Soft. Bowel sounds are normal. He exhibits no distension.  Musculoskeletal:  Right AKA and left below knee amputation noted Dry gangrene of the fourth finger right hand noted. Multiple superficial skin breakdowns in the sacral region noted    Assessment/Plan: Status post hypotensive shock multifactorial i.e. 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 Status post Coumadin toxicity Acute anemia rule out GI loss Hypertension Diabetes mellitus Hyperlipidemia Peripheral vascular disease status post right AKA and left BKA Hypothyroidism COPD GERD Chronic kidney disease stage II Malnutrition Marked leukocytosis etiology unclear Plan Check serial enzymes and EKG Patient not be candidate for any invasive workup at present due to multiple comorbidities Agree with holding diltiazem in view of bradycardia and hypotension Consider starting low dose Lopressor once heart rate and blood pressure improves Consider starting aspirin once anemia etiology is resolved  Charolette Forward 07/26/2015, 10:23 AM

## 2015-07-27 ENCOUNTER — Inpatient Hospital Stay (HOSPITAL_COMMUNITY): Payer: Commercial Managed Care - HMO | Admitting: Certified Registered Nurse Anesthetist

## 2015-07-27 ENCOUNTER — Encounter (HOSPITAL_COMMUNITY)
Admission: EM | Disposition: A | Discharge status: Hospice | Payer: Self-pay | Source: Home / Self Care | Attending: Family Medicine

## 2015-07-27 HISTORY — PX: AMPUTATION: SHX166

## 2015-07-27 LAB — PROTIME-INR
INR: 4.06 — AB (ref 0.00–1.49)
INR: 2.46 — ABNORMAL HIGH (ref 0.00–1.49)
INR: 1.87 — ABNORMAL HIGH (ref 0.00–1.49)
PROTHROMBIN TIME: 38.4 s — AB (ref 11.6–15.2)
Prothrombin Time: 26.4 seconds — ABNORMAL HIGH (ref 11.6–15.2)
Prothrombin Time: 21.4 seconds — ABNORMAL HIGH (ref 11.6–15.2)

## 2015-07-27 LAB — CBC WITH DIFFERENTIAL/PLATELET
BASOS ABS: 0 10*3/uL (ref 0.0–0.1)
Basophils Relative: 0 %
EOS ABS: 0 10*3/uL (ref 0.0–0.7)
Eosinophils Relative: 0 %
HCT: 24.7 % — ABNORMAL LOW (ref 39.0–52.0)
Hemoglobin: 8.3 g/dL — ABNORMAL LOW (ref 13.0–17.0)
LYMPHS PCT: 5 %
Lymphs Abs: 1.3 10*3/uL (ref 0.7–4.0)
MCH: 28.9 pg (ref 26.0–34.0)
MCHC: 33.6 g/dL (ref 30.0–36.0)
MCV: 86.1 fL (ref 78.0–100.0)
MONOS PCT: 8 %
Monocytes Absolute: 2.1 10*3/uL — ABNORMAL HIGH (ref 0.1–1.0)
NEUTROS ABS: 22.5 10*3/uL — AB (ref 1.7–7.7)
NEUTROS PCT: 87 %
PLATELETS: 205 10*3/uL (ref 150–400)
RBC: 2.87 MIL/uL — AB (ref 4.22–5.81)
RDW: 15.7 % — ABNORMAL HIGH (ref 11.5–15.5)
WBC: 25.9 10*3/uL — ABNORMAL HIGH (ref 4.0–10.5)

## 2015-07-27 LAB — GLUCOSE, CAPILLARY
GLUCOSE-CAPILLARY: 265 mg/dL — AB (ref 65–99)
GLUCOSE-CAPILLARY: 171 mg/dL — AB (ref 65–99)
Glucose-Capillary: 234 mg/dL — ABNORMAL HIGH (ref 65–99)

## 2015-07-27 LAB — BASIC METABOLIC PANEL
ANION GAP: 8 (ref 5–15)
BUN: 61 mg/dL — AB (ref 6–20)
CHLORIDE: 113 mmol/L — AB (ref 101–111)
CO2: 21 mmol/L — ABNORMAL LOW (ref 22–32)
Calcium: 8.2 mg/dL — ABNORMAL LOW (ref 8.9–10.3)
Creatinine, Ser: 1.36 mg/dL — ABNORMAL HIGH (ref 0.61–1.24)
GFR, EST AFRICAN AMERICAN: 59 mL/min — AB (ref >60)
GFR, EST NON AFRICAN AMERICAN: 51 mL/min — AB (ref >60)
Glucose, Bld: 278 mg/dL — ABNORMAL HIGH (ref 65–99)
POTASSIUM: 4.3 mmol/L (ref 3.5–5.1)
SODIUM: 142 mmol/L (ref 135–145)

## 2015-07-27 LAB — OCCULT BLOOD X 1 CARD TO LAB, STOOL: FECAL OCCULT BLD: POSITIVE — AB

## 2015-07-27 SURGERY — AMPUTATION DIGIT
Anesthesia: General | Site: Finger | Laterality: Right

## 2015-07-27 MED ORDER — PHYTONADIONE 5 MG PO TABS
2.5000 mg | ORAL_TABLET | Freq: Once | ORAL | Status: AC
  Administered 2015-07-27: 2.5 mg via ORAL
  Filled 2015-07-27: qty 1

## 2015-07-27 MED ORDER — ROCURONIUM BROMIDE 50 MG/5ML IV SOLN
INTRAVENOUS | Status: AC
  Filled 2015-07-27: qty 1

## 2015-07-27 MED ORDER — ETOMIDATE 2 MG/ML IV SOLN
INTRAVENOUS | Status: AC
  Filled 2015-07-27: qty 10

## 2015-07-27 MED ORDER — LIDOCAINE HCL (CARDIAC) 20 MG/ML IV SOLN
INTRAVENOUS | Status: DC | PRN
  Administered 2015-07-27: 100 mg via INTRAVENOUS

## 2015-07-27 MED ORDER — MIDAZOLAM HCL 2 MG/2ML IJ SOLN
INTRAMUSCULAR | Status: AC
  Filled 2015-07-27: qty 4

## 2015-07-27 MED ORDER — PROPOFOL 10 MG/ML IV BOLUS
INTRAVENOUS | Status: AC
  Filled 2015-07-27: qty 20

## 2015-07-27 MED ORDER — LIDOCAINE HCL (CARDIAC) 20 MG/ML IV SOLN
INTRAVENOUS | Status: AC
  Filled 2015-07-27: qty 10

## 2015-07-27 MED ORDER — LACTATED RINGERS IV SOLN
INTRAVENOUS | Status: DC | PRN
  Administered 2015-07-27: 17:00:00 via INTRAVENOUS

## 2015-07-27 MED ORDER — PROPOFOL 10 MG/ML IV BOLUS
INTRAVENOUS | Status: DC | PRN
  Administered 2015-07-27 (×2): 50 mg via INTRAVENOUS

## 2015-07-27 MED ORDER — ONDANSETRON HCL 4 MG/2ML IJ SOLN
INTRAMUSCULAR | Status: AC
  Filled 2015-07-27: qty 2

## 2015-07-27 MED ORDER — OXYCODONE HCL 5 MG PO TABS
5.0000 mg | ORAL_TABLET | Freq: Once | ORAL | Status: AC | PRN
  Administered 2015-07-27: 5 mg via ORAL

## 2015-07-27 MED ORDER — FENTANYL CITRATE (PF) 100 MCG/2ML IJ SOLN
25.0000 ug | INTRAMUSCULAR | Status: DC | PRN
  Administered 2015-07-27: 25 ug via INTRAVENOUS

## 2015-07-27 MED ORDER — FENTANYL CITRATE (PF) 250 MCG/5ML IJ SOLN
INTRAMUSCULAR | Status: AC
  Filled 2015-07-27: qty 5

## 2015-07-27 MED ORDER — SUCCINYLCHOLINE CHLORIDE 20 MG/ML IJ SOLN
INTRAMUSCULAR | Status: AC
  Filled 2015-07-27: qty 1

## 2015-07-27 MED ORDER — ONDANSETRON HCL 4 MG/2ML IJ SOLN
4.0000 mg | Freq: Once | INTRAMUSCULAR | Status: DC | PRN
Start: 2015-07-27 — End: 2015-07-27

## 2015-07-27 MED ORDER — LIDOCAINE HCL (CARDIAC) 20 MG/ML IV SOLN
INTRAVENOUS | Status: AC
  Filled 2015-07-27: qty 5

## 2015-07-27 MED ORDER — 0.9 % SODIUM CHLORIDE (POUR BTL) OPTIME
TOPICAL | Status: DC | PRN
  Administered 2015-07-27: 1000 mL

## 2015-07-27 MED ORDER — CHLORHEXIDINE GLUCONATE 4 % EX LIQD
60.0000 mL | Freq: Once | CUTANEOUS | Status: AC
  Administered 2015-07-27: 4 via TOPICAL
  Filled 2015-07-27: qty 60

## 2015-07-27 MED ORDER — BUPIVACAINE HCL (PF) 0.25 % IJ SOLN
INTRAMUSCULAR | Status: AC
  Filled 2015-07-27: qty 30

## 2015-07-27 MED ORDER — ARTIFICIAL TEARS OP OINT
TOPICAL_OINTMENT | OPHTHALMIC | Status: AC
  Filled 2015-07-27: qty 3.5

## 2015-07-27 MED ORDER — ONDANSETRON HCL 4 MG/2ML IJ SOLN
INTRAMUSCULAR | Status: DC | PRN
  Administered 2015-07-27: 4 mg via INTRAVENOUS

## 2015-07-27 MED ORDER — OXYCODONE HCL 5 MG/5ML PO SOLN: 5.0000 mg | Freq: Once | ORAL | Status: AC | PRN

## 2015-07-27 MED ORDER — OXYCODONE HCL 5 MG PO TABS
ORAL_TABLET | ORAL | Status: AC
  Filled 2015-07-27: qty 1

## 2015-07-27 MED ORDER — SODIUM CHLORIDE 0.9 % IV SOLN
Freq: Once | INTRAVENOUS | Status: AC
  Administered 2015-07-27: 11:00:00 via INTRAVENOUS

## 2015-07-27 MED ORDER — BUPIVACAINE HCL 0.25 % IJ SOLN
INTRAMUSCULAR | Status: DC | PRN
  Administered 2015-07-27: 5 mL

## 2015-07-27 MED ORDER — FENTANYL CITRATE (PF) 100 MCG/2ML IJ SOLN
INTRAMUSCULAR | Status: AC
  Filled 2015-07-27: qty 2

## 2015-07-27 SURGICAL SUPPLY — 48 items
BANDAGE COBAN STERILE 2 (GAUZE/BANDAGES/DRESSINGS) ×3 IMPLANT
BLADE LONG MED 31MMX9MM (MISCELLANEOUS) ×1
BLADE LONG MED 31X9 (MISCELLANEOUS) ×2 IMPLANT
BNDG COHESIVE 3X5 TAN STRL LF (GAUZE/BANDAGES/DRESSINGS) IMPLANT
BNDG ESMARK 4X9 LF (GAUZE/BANDAGES/DRESSINGS) ×3 IMPLANT
BNDG GAUZE ELAST 4 BULKY (GAUZE/BANDAGES/DRESSINGS) ×3 IMPLANT
CHLORAPREP W/TINT 26ML (MISCELLANEOUS) ×3 IMPLANT
CORDS BIPOLAR (ELECTRODE) ×3 IMPLANT
CUFF TOURNIQUET SINGLE 18IN (TOURNIQUET CUFF) ×3 IMPLANT
CUFF TOURNIQUET SINGLE 24IN (TOURNIQUET CUFF) IMPLANT
DRSG KUZMA FLUFF (GAUZE/BANDAGES/DRESSINGS) IMPLANT
DURAPREP 26ML APPLICATOR (WOUND CARE) IMPLANT
GAUZE SPONGE 4X4 12PLY STRL (GAUZE/BANDAGES/DRESSINGS) ×3 IMPLANT
GAUZE XEROFORM 1X8 LF (GAUZE/BANDAGES/DRESSINGS) ×3 IMPLANT
GLOVE BIO SURGEON STRL SZ 6.5 (GLOVE) IMPLANT
GLOVE BIO SURGEON STRL SZ7 (GLOVE) ×3 IMPLANT
GLOVE BIO SURGEON STRL SZ7.5 (GLOVE) ×3 IMPLANT
GLOVE BIO SURGEONS STRL SZ 6.5 (GLOVE)
GLOVE BIOGEL PI IND STRL 8 (GLOVE) ×1 IMPLANT
GLOVE BIOGEL PI INDICATOR 8 (GLOVE) ×2
GLOVE SURG ORTHO 8.0 STRL STRW (GLOVE) ×3 IMPLANT
GLOVE SURG SS PI 6.5 STRL IVOR (GLOVE) ×3 IMPLANT
GOWN STRL REUS W/ TWL LRG LVL3 (GOWN DISPOSABLE) ×1 IMPLANT
GOWN STRL REUS W/ TWL XL LVL3 (GOWN DISPOSABLE) ×1 IMPLANT
GOWN STRL REUS W/TWL LRG LVL3 (GOWN DISPOSABLE) ×2
GOWN STRL REUS W/TWL XL LVL3 (GOWN DISPOSABLE) ×2
KIT BASIN OR (CUSTOM PROCEDURE TRAY) ×3 IMPLANT
KIT ROOM TURNOVER OR (KITS) ×3 IMPLANT
MANIFOLD NEPTUNE II (INSTRUMENTS) ×3 IMPLANT
NEEDLE HYPO 25GX1X1/2 BEV (NEEDLE) ×3 IMPLANT
NS IRRIG 1000ML POUR BTL (IV SOLUTION) ×3 IMPLANT
PACK ORTHO EXTREMITY (CUSTOM PROCEDURE TRAY) ×3 IMPLANT
PAD ARMBOARD 7.5X6 YLW CONV (MISCELLANEOUS) ×6 IMPLANT
PAD CAST 4YDX4 CTTN HI CHSV (CAST SUPPLIES) IMPLANT
PADDING CAST COTTON 4X4 STRL (CAST SUPPLIES)
RUBBERBAND STERILE (MISCELLANEOUS) IMPLANT
SPECIMEN JAR SMALL (MISCELLANEOUS) ×3 IMPLANT
SPONGE SCRUB IODOPHOR (GAUZE/BANDAGES/DRESSINGS) IMPLANT
SUT ETHILON 5 0 PS 2 18 (SUTURE) IMPLANT
SUT MON AB 5-0 P3 18 (SUTURE) ×3 IMPLANT
SUT SILK 4 0 PS 2 (SUTURE) IMPLANT
SUT VIC AB 4-0 P-3 18X BRD (SUTURE) ×1 IMPLANT
SUT VIC AB 4-0 P3 18 (SUTURE) ×2
SUT VICRYL 4-0 PS2 18IN ABS (SUTURE) IMPLANT
SYR CONTROL 10ML LL (SYRINGE) ×3 IMPLANT
TOWEL OR 17X24 6PK STRL BLUE (TOWEL DISPOSABLE) ×3 IMPLANT
TOWEL OR 17X26 10 PK STRL BLUE (TOWEL DISPOSABLE) ×3 IMPLANT
UNDERPAD 30X30 INCONTINENT (UNDERPADS AND DIAPERS) ×3 IMPLANT

## 2015-07-27 NOTE — Anesthesia Procedure Notes (Signed)
Procedure Name: LMA Insertion Date/Time: 07/27/2015 5:44 PM Performed by: Rise Patience T Pre-anesthesia Checklist: Patient identified, Emergency Drugs available, Suction available and Patient being monitored Patient Re-evaluated:Patient Re-evaluated prior to inductionOxygen Delivery Method: Circle system utilized Preoxygenation: Pre-oxygenation with 100% oxygen Intubation Type: IV induction LMA: LMA inserted LMA Size: 5.0 Number of attempts: 1 Placement Confirmation: positive ETCO2 and breath sounds checked- equal and bilateral Tube secured with: Tape Dental Injury: Teeth and Oropharynx as per pre-operative assessment

## 2015-07-27 NOTE — Progress Notes (Signed)
Physical Therapy Treatment Patient Details Name: [name] MRN: [id number] DOB: [birth year] Today's Date: 07/27/2015    History of Present Illness 69 y.o. 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    PT Comments    Patient in bed, agreeable to participate in PT today, however patient often only verbally responds to cues and not physically ("yes I can lift my arms up" but no initiation of movement). He is a limited participant in PT and has severely restricted mobility due to his PMH. Patient will benefit from continued PT to get him to move his UE's and assist in repositioning him for pressure relief with a goal of increased independence with bed mobility.    Follow Up Recommendations  Home health PT;Supervision/Assistance - 24 hour     Equipment Recommendations  None recommended by PT    Recommendations for Other Services       Precautions / Restrictions Precautions Precaution Comments: Patient bedbound for unknown amount of time. Restrictions Weight Bearing Restrictions: No    Mobility  Bed Mobility Overal bed mobility: Needs Assistance Bed Mobility: Rolling Rolling: Min assist         General bed mobility comments: Patient able to use R arm to pull self to L rolling with min A for UE initiation and some hip rolling.  Transfers                    Ambulation/Gait                 Stairs            Wheelchair Mobility    Modified Rankin (Stroke Patients Only)       Balance                                    Cognition Arousal/Alertness: Lethargic Behavior During Therapy: Flat affect Overall Cognitive Status: No family/caregiver present to determine baseline cognitive functioning                      Exercises General Exercises - Upper  Extremity Shoulder Flexion: PROM;Right;AROM;Left;10 reps;Supine Shoulder ABduction: PROM;Both;10 reps;Supine Elbow Flexion: AROM;Both;5 reps;Supine Composite Extension: AROM;Right;5 reps;Supine    General Comments        Pertinent Vitals/Pain Pain Assessment: No/denies pain    Home Living                      Prior Function            PT Goals (current goals can now be found in the care plan section) Acute Rehab PT Goals Patient Stated Goal: Move around more. PT Goal Formulation: Patient unable to participate in goal setting Time For Goal Achievement: 08/08/15 Potential to Achieve Goals: Fair Progress towards PT goals: Progressing toward goals    Frequency  Min 2X/week    PT Plan Frequency needs to be updated    Co-evaluation             End of Session   Activity Tolerance: Patient limited by fatigue;Patient limited by lethargy Patient left: in bed;with call bell/phone within reach;with bed alarm set     Time: 4098-1191 PT Time Calculation (min) (ACUTE ONLY): 10  min  Charges:  $Therapeutic Exercise: 8-22 mins                    G CodesMichele Rockers, SPT [phone] 07/27/2015, 10:12 AM  I have read, reviewed and agree with student's note.   [facility] [phone] (pager)

## 2015-07-27 NOTE — Transfer of Care (Signed)
Immediate Anesthesia Transfer of Care Note  Patient: [name]  Procedure(s) Performed: Procedure(s): FINGER AMPUTATION, right ring (Right)  Patient Location: PACU  Anesthesia Type:General  Level of Consciousness: awake and alert   Airway & Oxygen Therapy: Patient Spontanous Breathing and Patient connected to nasal cannula oxygen  Post-op Assessment: Report given to RN, Post -op Vital signs reviewed and stable and Patient moving all extremities X 4  Post vital signs: Reviewed and stable  Last Vitals:  Filed Vitals:   07/27/15 1425  BP: 104/50  Pulse:   Temp: 36.8 C  Resp: 20    Complications: No apparent anesthesia complications

## 2015-07-27 NOTE — Progress Notes (Signed)
   VASCULAR SURGERY ASSESSMENT & PLAN:  * For amputation of right ring finger today by Dr. Merlyn Lot. Hopefully, has adequate circulation to heal this, as pt is not a candidate for revascularization. He has diffuse disease, CKD, severe protein malnutition, and DM.  *  Vascular Surgery will be available as needed  SUBJECTIVE: No complaints  PHYSICAL EXAM: Filed Vitals:   07/27/15 0200 07/27/15 0400 07/27/15 0817 07/27/15 1038  BP: 105/40 113/36 89/50 109/52  Pulse: 93 94 92   Temp:  98.3 F (36.8 C) 98.2 F (36.8 C) 98.3 F (36.8 C)  TempSrc:  Oral Oral Oral  Resp: Height:      Weight:      SpO2:  98% 100% 99%   No change in dry gangrene of right ring finger.   LABS: Lab Results  Component Value Date   WBC 25.9* 07/27/2015   HGB 8.3* 07/27/2015   HCT 24.7* 07/27/2015   MCV 86.1 07/27/2015   PLT 205 07/27/2015   Lab Results  Component Value Date   CREATININE 1.36* 07/27/2015   Lab Results  Component Value Date   INR 4.06* 07/27/2015   CBG (last 3)   Recent Labs  07/26/15 1641 07/26/15 2152 07/27/15 0820  GLUCAP 270* 277* 265*    Active Problems:   FTT (failure to thrive) in adult   Hypotension   Sepsis   Pressure ulcer   Dry gangrene   Blood poisoning   Acute confusional state    Cari Caraway Beeper: [phone] 07/27/2015

## 2015-07-27 NOTE — Anesthesia Postprocedure Evaluation (Signed)
Anesthesia Post Note  Patient: [name]  Procedure(s) Performed: Procedure(s) (LRB): FINGER AMPUTATION, right ring (Right)  Anesthesia type: general  Patient location: PACU  Post pain: Pain level controlled  Post assessment: Patient's Cardiovascular Status Stable  Last Vitals:  Filed Vitals:   07/27/15 1945  BP:   Pulse: 86  Temp:   Resp: 14    Post vital signs: Reviewed and stable  Level of consciousness: sedated  Complications: No apparent anesthesia complications

## 2015-07-27 NOTE — Progress Notes (Signed)
OT Cancellation Note and Discharge  Patient Details Name: CLAYSON RILING MRN: [id number] DOB: 03-15-1946   Cancelled Treatment:    Reason Eval/Treat Not Completed: Other (comment). Spoke with PT and CM and pta pt bed bound and total care for BADLs including feeding himself. No acute OT needs, we will sign off.  Evette Georges [phone] 07/27/2015, 9:13 AM

## 2015-07-27 NOTE — Care Management Important Message (Signed)
Important Message  Patient Details  Name: [name] MRN: [id number] Date of Birth: 03-Apr-1946   Medicare Important Message Given:  Yes-second notification given    Kyla Balzarine 07/27/2015, 12:05 PM

## 2015-07-27 NOTE — Op Note (Signed)
[id number] 

## 2015-07-27 NOTE — Progress Notes (Signed)
Subjective: Day of Surgery Procedure(s) (LRB): FINGER AMPUTATION (Right) Patient reports pain as controlled.    Objective: Vital signs in last 24 hours: Temp:  [98.1 F (36.7 C)-98.6 F (37 C)] 98.2 F (36.8 C) (09/30 1356) Pulse Rate:  [70-96] 94 (09/30 1308) Resp:  [16-23] 21 (09/30 1229) BP: (81-113)/(33-73) 105/41 mmHg (09/30 1320) SpO2:  [96 %-100 %] 100 % (09/30 1356)  Intake/Output from previous day: 09/29 0701 - 09/30 0700 In: 3803.3 [P.O.:960; I.V.:2208.3; Blood:335; IV Piggyback:300] Out: 2625 [Urine:2625] Intake/Output this shift: Total I/O In: 441 [I.V.:3; Blood:438] Out: 1100 [Urine:1100]   Recent Labs  07/25/15 0244 07/26/15 0802 07/26/15 1227 07/27/15 0616  HGB 9.1* 7.4* 7.2* 8.3*    Recent Labs  07/26/15 1227 07/27/15 0616  WBC 27.2* 25.9*  RBC 2.59* 2.87*  HCT 22.2* 24.7*  PLT 207  214 205    Recent Labs  07/26/15 0540 07/27/15 0616  NA 137 142  K 4.5 4.3  CL 111 113*  CO2 19* 21*  BUN 76* 61*  CREATININE 1.45* 1.36*  GLUCOSE 235* 278*  CALCIUM 8.0* 8.2*    Recent Labs  07/27/15 1246 07/27/15 1532  INR 2.46* 1.87*    right ring finger with dry gangrene.  no erythema.  Assessment/Plan: Day of Surgery Procedure(s) (LRB): FINGER AMPUTATION (Right) Plan amputation right ring finger for gangrene.  Risks, benefits, and alternatives of surgery were discussed with the patient and his son (POA) yesterday and the patient and his son agree with the plan of care.   KUZMA,KEVIN R 07/27/2015, 4:31 PM

## 2015-07-27 NOTE — Progress Notes (Signed)
Name: [name] MRN: [id number] DOB: 07-Jan-1946    ADMISSION DATE:  07/24/2015 CONSULTATION DATE:  9/29 REFERRING MD :  Family Medicine   CHIEF COMPLAINT:  Bradycardia, Hypotension  BRIEF PATIENT DESCRIPTION:  Pt is a 2 yom with PMH c/w HTN, T2DM, CAD, PVD s/p R AKA and L BKA, Afib (on Coumadin), CVA, HLD who presented to the [facility] on 9/27 with sepsis with likely source of gangrenous right fourth digit, supratherapeutic INR (5.2), sacral skin breakdown, and failure to thrive. On 9/29, Pt with sustained hypotension, bradycardia with mildly elevated troponin. PCCM asked to consult.   SIGNIFICANT EVENTS  9/27> Pt admitted from ED with FTT, gangrene of digit, supratherapeutic INR  9/29> Pt with worsening hypotension, bradycardia, and anemia   STUDIES:  9/28 ECHO > EF 60-65%    SUBJECTIVE:  Bps improved Denies dizziness, CP  VITAL SIGNS: Temp:  [97.3 F (36.3 C)-98.4 F (36.9 C)] 98.3 F (36.8 C) (09/30 0400) Pulse Rate:  [38-96] 94 (09/30 0400) Resp:  [13-23] 18 (09/30 0400) BP: (81-113)/(24-77) 113/36 mmHg (09/30 0400) SpO2:  [96 %-100 %] 98 % (09/30 0400)  PHYSICAL EXAMINATION: General:  Chronically ill appearing male in NAD.  Neuro:  Alert. Oriented x 1. Follows commands  HEENT:  NCAT. MMM  Cardiovascular:  Irregular  Lungs:  Lungs diminished bilat.  Abdomen:  Soft, non-distended. Non-tender  Musculoskeletal:  Intact, bilat BKA  Skin: Multiple sacral wounds noted to sacral area, no purulent drainage. Gangrenous Rt fourth digit.    Recent Labs Lab 07/25/15 0244 07/26/15 0540 07/27/15 0616  NA 132* 137 142  K 4.1 4.5 4.3  CL 102 111 113*  CO2 21* 19* 21*  BUN 95* 76* 61*  CREATININE 1.52* 1.45* 1.36*  GLUCOSE 59* 235* 278*    Recent Labs Lab 07/26/15 0802 07/26/15 1227 07/27/15 0616  HGB 7.4* 7.2* 8.3*  HCT 22.3* 22.2* 24.7*  WBC 22.7* 27.2* 25.9*  PLT 176 207  214 205    Recent Labs Lab 07/24/15 1139  07/24/15 1433 07/24/15 1928  07/25/15 0244 07/26/15 0802 07/26/15 1227 07/27/15 0616  WBC  --   < >  --   --  21.2* 22.7* 27.2* 25.9*  LATICACIDVEN 3.08*  --  2.00 1.1  --   --   --   --   < > = values in this interval not displayed. No results found.  ASSESSMENT / PLAN:  CARDIOVASCULAR A: Hypotension. Etiology unclear Atrial fibrillation Bradycadia Demand ischemia Does meet sepsis criteria, however his is bradycardic on rate controlling meds that could exacerbate hypotension. So this would add symptomatic bradycardia vs simply hypotension d/t his medications to list of potential causes of hypotension as well. The fact that his creatinine has improved would argue against this being shock P: Held all antihypertensives and diuretics rx relative adrenal insufficiency   ID A: Sepsis: Gangrenous Rt 4th digit- only obvious source; also has sacral wound P: BCx2 9/27>> ng Cont Vanc/Zosyn  Can simplify abx amputation planned   RENAL A: AKI Metabolic acidosis> Primarily non-AG acidosis in setting of AKI vs. GI loss   P:  gentle IVFs  GI A:  Malnourishment in setting of FTT  P: Supplement diet per dietitian   HEME A: Anemia : H&H drop from 9.1 to 7.4 since 9/29 with no obvious source of bleeding. Transfused 1 U PRBCs  Supratherapeutic INR: P: Fecal Occult Blood  Trend CBC  Coumadin per pharmacy Follow coags/INR   ENDOCRINE A: Relative adrenal insufficiency -am  cortisol 9 in setting of hypotension Hypothyroidism  Hyperglycemia w/ known DM P solucortef 50 mg q 6 -can taper to off, doubt long term required Cont CBGs and SSI  Cont Synthroid   Neuro A: Dementia, w/ failure to thrive: dependent for all ADLs P:  Supportive care    Summary: BP improved with empiric abx & steroids but favor effect of BP meds rather than true septic shock or adrenal insuff  PCCM available, would discuss advance directives with family involvement  Oretha Milch MD [phone]    07/27/2015, 7:16 AM

## 2015-07-27 NOTE — Progress Notes (Signed)
Subjective:  Patient denies any chest pain or shortness of breath.  Patient tentatively scheduled for amputation of fourth finger right hand.  Bradycardia and hypotension, resolved.  Objective:  Vital Signs in the last 24 hours: Temp:  [97.8 F (36.6 C)-98.6 F (37 C)] 98.1 F (36.7 C) (09/30 1229) Pulse Rate:  [70-96] 92 (09/30 1229) Resp:  [14-23] 21 (09/30 1229) BP: (81-113)/(24-77) 108/37 mmHg (09/30 1229) SpO2:  [96 %-100 %] 100 % (09/30 1229)  Intake/Output from previous day: 09/29 0701 - 09/30 0700 In: 3803.3 [P.O.:960; I.V.:2208.3; Blood:335; IV Piggyback:300] Out: 2625 [Urine:2625] Intake/Output from this shift: Total I/O In: 224 [I.V.:3; Blood:221] Out: -   Physical Exam: Neck: no adenopathy, no carotid bruit, no JVD and supple, symmetrical, trachea midline Lungs: decreased breath sounds at bases Heart: irregularly irregular rhythm, S1, S2 normal and 2/6 systolic murmur noted Abdomen: soft, non-tender; bowel sounds normal; no masses,  no organomegaly Extremities: right AKA and left BKA noted. Dry gangrene of fourth finger, right hand noted Lab Results:  Recent Labs  07/26/15 1227 07/27/15 0616  WBC 27.2* 25.9*  HGB 7.2* 8.3*  PLT 207  214 205    Recent Labs  07/26/15 0540 07/27/15 0616  NA 137 142  K 4.5 4.3  CL 111 113*  CO2 19* 21*  GLUCOSE 235* 278*  BUN 76* 61*  CREATININE 1.45* 1.36*    Recent Labs  07/26/15 0802 07/26/15 1227  TROPONINI 0.37* 0.30*   Hepatic Function Panel  Recent Labs  07/25/15 0244 07/26/15 0540  PROT 5.0* 5.0*  ALBUMIN 2.0* 1.8*  AST 70* 53*  ALT 39 41  ALKPHOS 56 31*  BILITOT 0.6 0.6  BILIDIR 0.2  --   IBILI 0.4  --     Recent Labs  07/25/15 0244  CHOL 90   No results for input(s): PROTIME in the last 72 hours.  Imaging: Imaging results have been reviewed and No results found.  Cardiac Studies:  Assessment/Plan:  Status post hypotensive shock multifactorial i.e. 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 No active cardiac issues at this point. I will sign off. Please call if needed  LOS: 3 days    Rinaldo Cloud 07/27/2015, 12:41 PM

## 2015-07-27 NOTE — Progress Notes (Addendum)
ANTICOAGULATION CONSULT NOTE - Follow Up Consult  Pharmacy Consult: Coumadin and Antibiotics Indication: atrial fibrillation and r/o Sepsis  No Known Allergies  Patient Measurements: Height:  (180.3 cm) (prior to amputation) Weight: 131 lb 2.8 oz (59.5 kg) IBW/kg (Calculated) : 75.3  Vital Signs: Temp: 98.2 F (36.8 C) (09/30 0817) Temp Source: Oral (09/30 0817) BP: 89/50 mmHg (09/30 0817) Pulse Rate: 92 (09/30 0817)  Labs:  Recent Labs  07/25/15 0244 07/26/15 0540 07/26/15 0802 07/26/15 1227 07/27/15 0616  HGB 9.1*  --  7.4* 7.2* 8.3*  HCT 27.3*  --  22.3* 22.2* 24.7*  PLT 236  --  176 207  214 205  APTT  --   --   --  64*  --   LABPROT 64.5* 36.0*  --  33.5* 38.4*  INR 8.09* 3.72*  --  3.39* 4.06*  CREATININE 1.52* 1.45*  --   --  1.36*  TROPONINI  --   --  0.37* 0.30*  --    Estimated Creatinine Clearance: 42.5 mL/min (by C-G formula based on Cr of 1.36).  Assessment: 69 YOM on Coumadin PTA for history of AFib.  Patient's INR was supra-therapeutic on admit and Coumadin has been held. S/p reversal with vit K 2.5mg  po 9/28 after INR increased to 8.09. INR now down to  today and Vitamin K 2.5mg  PO was given. H/H improved this AM. Patient has a history of GIB, and heme + stool today.  No overt bleeding has been reported.  His INR has trended back up some (4.06) today and no Warfarin doses given since admission.    Plan:  - Continue to hold Coumadin  - Daily PT / INR and CBC for now.  Nadara Mustard, PharmD., MS Clinical Pharmacist Pager:  [phone] Thank you for allowing pharmacy to be part of this patients care team. 07/27/2015, 9:41 AM  Addendum: Pt. started on IV Vancomycin and Zosyn for r/o Sepsis.  He hs has 2 doses and seems to be tolerating without noted complications.  His renal function has improved slightly with a drop in screat. To 1.36.  His WBC is still elevated but trending down as well.  Blood cultures are pending with NGTD x 2days.  His PCR  for MRSA was negative on 9/27.  Urine culture reveals no growth as well.  Unfortunately, he is not a candidate for revascularization due to severe PVD and will need amputation of his finger.  Plan: - Continue Vancomycin and Zosyn as ordered - Will need to determine LOT with IV antibiotics

## 2015-07-27 NOTE — Progress Notes (Signed)
FPTS Interim Progress Note  S: Evaluated patient post op. Patient is alert and has no concerns. Denies chest pain, sob, or general pain.   O: BP 105/37 mmHg  Pulse 93  Temp(Src) 98.5 F (36.9 C) (Oral)  Resp 14  Ht  (1.803 m)  Wt 131 lb 2.8 oz (59.5 kg)  BMI 18.30 kg/m2  SpO2 97%  Gen: NAD resting comfortably, more alert than this morning Resp: on 1 L [place] with good saturation (placed on O2 post op), CTAB Cardiac: Distant heart sounds, irregularly irregular rhythm, normal rate Ext: right hand bandaged; dressing is clean and dry   A/P: - doing well post op - will f/u of post op recommendations  - wean O2 as tolerated  Palma Holter, MD 07/27/2015, 10:12 PM PGY-1, [facility] Family Medicine Service pager [phone]

## 2015-07-27 NOTE — Progress Notes (Signed)
Inpatient Diabetes Program Recommendations  AACE/ADA: New Consensus Statement on Inpatient Glycemic Control (2015)  Target Ranges:  Prepandial:   less than 140 mg/dL      Peak postprandial:   less than 180 mg/dL (1-2 hours)      Critically ill patients:  140 - 180 mg/dL   Results for [name], [name] (MRN [id number]) as of 07/27/2015 08:51  Ref. Range 07/26/2015 07:44 07/26/2015 12:09 07/26/2015 16:41 07/26/2015 21:52 07/27/2015 08:20  Glucose-Capillary Latest Ref Range: 65-99 mg/dL 409 (H) 811 (H) 914 (H) 277 (H) 265 (H)   Review of Glycemic Control  Diabetes history: DM2 Outpatient Diabetes medications: U500 6 units daily, Amaryl 2 mg QAM Current orders for Inpatient glycemic control: Novolog 0-15 units TID with meals, Novolog 0-5 units HS  Inpatient Diabetes Program Recommendations: Insulin - Basal: Glucose has ranged from 215-277 mg/dl over the past 24 hours and fasting glucose is 265 mg/dl this morning. Patient is ordered Solucortef 50 mg Q6H which is contributing to hyperglycemia. If steroids are continued, please consider ordering Lantus 10 units daily starting now.  Thanks, Orlando Penner, RN, MSN, CCRN, CDE Diabetes Coordinator Inpatient Diabetes Program [phone] (Team Pager from 8am to 5pm) [phone] ([facility] office) [phone] [facility] office) [phone] [facility] office)

## 2015-07-27 NOTE — Anesthesia Preprocedure Evaluation (Addendum)
Anesthesia Evaluation  Patient identified by MRN, date of birth, ID band Patient awake    Reviewed: Allergy & Precautions, H&P , NPO status , Patient's Chart, lab work & pertinent test results, reviewed documented beta blocker date and time   History of Anesthesia Complications Negative for: history of anesthetic complications  Airway Mallampati: II  TM Distance: >3 FB Neck ROM: full    Dental no notable dental hx.    Pulmonary COPD, Current Smoker,    Pulmonary exam normal breath sounds clear to auscultation       Cardiovascular hypertension, Pt. on medications + CAD  Normal cardiovascular exam+ dysrhythmias Atrial Fibrillation  Rhythm:regular Rate:Normal  Echo 07/25/15: EF 55%-60%, moderate AS with restricted leaflet mobility -anticoag with warfarin chronically   Neuro/Psych Depression CVA negative neurological ROS     GI/Hepatic Neg liver ROS, GERD  ,  Endo/Other  diabetes, Type 2, Insulin DependentHypothyroidism   Renal/GU Renal disease     Musculoskeletal   Abdominal   Peds  Hematology  (+) anemia ,   Anesthesia Other Findings   Reproductive/Obstetrics negative OB ROS                           Anesthesia Physical Anesthesia Plan  ASA: IV  Anesthesia Plan: General   Post-op Pain Management:    Induction: Intravenous  Airway Management Planned: LMA  Additional Equipment: None  Intra-op Plan:   Post-operative Plan: Extubation in OR  Informed Consent: I have reviewed the patients History and Physical, chart, labs and discussed the procedure including the risks, benefits and alternatives for the proposed anesthesia with the patient or authorized representative who has indicated his/her understanding and acceptance.   Dental Advisory Given  Plan Discussed with: Anesthesiologist, CRNA and Surgeon  Anesthesia Plan Comments: (Septic from gangrenous finger, Echo with normal EF,  mod AS GA with LMA Supratherapuetic INR at 1.8 Getting stress dose steroids)     Anesthesia Quick Evaluation
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
## 2015-07-27 NOTE — Discharge Instructions (Addendum)
°  Hospice Discharge Instructions  The hospital and staff members extend their best wishes to you as you are discharged. Because we are most concerned with your health, we suggest you carefully read the following instructions.  Activity Instructions  YOU HAVE NO RESTRICTIONS UNLESS INSTRUCTED OTHERWISE  Restrictions:   YOU ARE TO CONTINUE PREADMISSION DIET UNLESS OTHERWISE INSTRUCTED  Diet: Glucerna shakes + Regular diet as tolerated   Urination: Foley Catheter   Drain/Tubes: None     Pain Assessment  Pain Score: 0-No pain  Duration:  none  Current pain management regimen- Instructed: No.   [facility] Instructions Hand Surgery  Wound Care: Keep your hand elevated above the level of your heart.  Do not allow it to dangle by your side.  Keep the dressing dry and do not remove it unless your doctor advises you to do so.  He will usually change it at the time of your post-op visit.  Moving your fingers is advised to stimulate circulation but will depend on the site of your surgery.  If you have a splint applied, your doctor will advise you regarding movement.  Activity: Do not drive or operate machinery today.  Rest today and then you may return to your normal activity and work as indicated by your physician.  Diet:  Drink liquids today or eat a light diet.  You may resume a regular diet tomorrow.    General expectations: Pain for two to three days. Fingers may become slightly swollen.  Call your doctor if any of the following occur: Severe pain not relieved by pain medication. Elevated temperature. Dressing soaked with blood. Inability to move fingers. White or bluish color to fingers.

## 2015-07-27 NOTE — Brief Op Note (Signed)
07/24/2015 - 07/27/2015  6:30 PM  PATIENT:  [name]  69 y.o. male  PRE-OPERATIVE DIAGNOSIS:  gangrene, right ring finger  POST-OPERATIVE DIAGNOSIS:  gangrene, right ring finger  PROCEDURE:  Procedure(s): FINGER AMPUTATION, right ring (Right)  SURGEON:  Surgeon(s) and Role:    * Betha Loa, MD - Primary  PHYSICIAN ASSISTANT:   ASSISTANTS: none   ANESTHESIA:   general  EBL:  Total I/O In: 1347 [I.V.:603; Blood:744] Out: 1300 [Urine:1300]  BLOOD ADMINISTERED:none  DRAINS: none   LOCAL MEDICATIONS USED:  MARCAINE     SPECIMEN:  Source of Specimen:  right ring finger  DISPOSITION OF SPECIMEN:  PATHOLOGY  COUNTS:  YES  TOURNIQUET:   Total Tourniquet Time Documented: Upper Arm (Right) - 34 minutes Total: Upper Arm (Right) - 34 minutes   DICTATION: .Other Dictation: Dictation Number [id number]  PLAN OF CARE: Discharge to home after PACU  PATIENT DISPOSITION:  PACU - hemodynamically stable.   Delay start of Pharmacological VTE agent (>24hrs) due to surgical blood loss or risk of bleeding: no

## 2015-07-28 LAB — PREPARE FRESH FROZEN PLASMA
UNIT DIVISION: 0
Unit division: 0
Unit division: 0
Unit division: 0
Unit division: 0

## 2015-07-28 LAB — CBC
HCT: 20.2 % — ABNORMAL LOW (ref 39.0–52.0)
Hemoglobin: 6.6 g/dL — CL (ref 13.0–17.0)
MCH: 28.6 pg (ref 26.0–34.0)
MCHC: 32.7 g/dL (ref 30.0–36.0)
MCV: 87.4 fL (ref 78.0–100.0)
PLATELETS: 183 10*3/uL (ref 150–400)
RBC: 2.31 MIL/uL — AB (ref 4.22–5.81)
RDW: 16.4 % — ABNORMAL HIGH (ref 11.5–15.5)
WBC: 20.9 10*3/uL — ABNORMAL HIGH (ref 4.0–10.5)

## 2015-07-28 LAB — BASIC METABOLIC PANEL
Anion gap: 8 (ref 5–15)
BUN: 56 mg/dL — AB (ref 6–20)
CO2: 22 mmol/L (ref 22–32)
CREATININE: 1.09 mg/dL (ref 0.61–1.24)
Calcium: 8.4 mg/dL — ABNORMAL LOW (ref 8.9–10.3)
Chloride: 115 mmol/L — ABNORMAL HIGH (ref 101–111)
GFR calc Af Amer: >60 mL/min (ref >60)
Glucose, Bld: 150 mg/dL — ABNORMAL HIGH (ref 65–99)
Potassium: 3.9 mmol/L (ref 3.5–5.1)
SODIUM: 145 mmol/L (ref 135–145)

## 2015-07-28 LAB — GLUCOSE, CAPILLARY
GLUCOSE-CAPILLARY: 200 mg/dL — AB (ref 65–99)
GLUCOSE-CAPILLARY: 105 mg/dL — AB (ref 65–99)
GLUCOSE-CAPILLARY: 287 mg/dL — AB (ref 65–99)
Glucose-Capillary: 157 mg/dL — ABNORMAL HIGH (ref 65–99)
Glucose-Capillary: 163 mg/dL — ABNORMAL HIGH (ref 65–99)

## 2015-07-28 LAB — PREPARE RBC (CROSSMATCH)

## 2015-07-28 LAB — PROTIME-INR
INR: 1.89 — ABNORMAL HIGH (ref 0.00–1.49)
PROTHROMBIN TIME: 21.6 s — AB (ref 11.6–15.2)

## 2015-07-28 LAB — HEMOGLOBIN AND HEMATOCRIT, BLOOD
HEMATOCRIT: 27.3 % — AB (ref 39.0–52.0)
Hemoglobin: 9.3 g/dL — ABNORMAL LOW (ref 13.0–17.0)

## 2015-07-28 MED ORDER — SODIUM CHLORIDE 0.9 % IV SOLN
Freq: Once | INTRAVENOUS | Status: AC
  Administered 2015-07-28: 16:00:00 via INTRAVENOUS

## 2015-07-28 MED ORDER — PANTOPRAZOLE SODIUM 40 MG IV SOLR
40.0000 mg | Freq: Two times a day (BID) | INTRAVENOUS | Status: DC
  Administered 2015-07-28 – 2015-07-29 (×3): 40 mg via INTRAVENOUS
  Filled 2015-07-28 (×3): qty 40

## 2015-07-28 MED ORDER — WARFARIN SODIUM 3 MG PO TABS: 3.0000 mg | ORAL_TABLET | ORAL | Status: DC

## 2015-07-28 MED ORDER — SODIUM CHLORIDE 0.9 % IV SOLN: Freq: Once | INTRAVENOUS | Status: DC

## 2015-07-28 MED ORDER — WARFARIN SODIUM 1 MG PO TABS
1.5000 mg | ORAL_TABLET | ORAL | Status: DC
  Filled 2015-07-28: qty 1

## 2015-07-28 NOTE — Progress Notes (Signed)
Dear Doctor: [name]  This patient has been identified as a candidate for PICC for the following reason (s): IV therapy over 48 hours, poor veins/poor circulatory system (CHF, COPD, emphysema, diabetes, steroid use, IV drug abuse, etc.) and restarts due to phlebitis and infiltration in 24 hours If you agree, please write an order for the indicated device. For any questions contact the Vascular Access Team at [phone] if no answer, please leave a message.  Thank you for supporting the early vascular access assessment program.

## 2015-07-28 NOTE — Consult Note (Addendum)
Referring Provider: Dr. Jennette Kettle Primary Care Physician:  Julian Hy, MD Primary Gastroenterologist:  Gentry Fitz  Reason for Consultation:  Anemia; Heme positive stool  HPI: [name] is a 69 y.o. male admitted for gangrene of the finger with sepsis and s/p amputation of finger on 07/27/15 being seen for a consult due to Hgb 6.6 and heme positive stool. Hgb 8.3 on 07/27/15 (Hgb 7.4 on 07/26/15). Denies abdominal pain, nausea, vomiting, heartburn, dysphagia, melena, hematochezia, or rectal bleeding. On chronic Coumadin at home and INR 8 on 07/25/15 and INR 1.89 today. EGD in 2010 (done for hematemesis) by Dr. Juanda Chance showed a duodenal bulb ulcer and distal esophagitis. Denies NSAIDs. Remote history of alcohol use per patient. Unknown whether he has had a colonoscopy in the past. S/P bilateral knee amputations in the past.    Past Medical History  Diagnosis Date  . Anemia   . CAD (coronary artery disease)   . History of GI bleed   . PAD (peripheral artery disease)   . Atrial fibrillation   . Hypothyroidism   . Hyperlipidemia   . Hypertension   . Chronic kidney disease     chronic renal insufficiency  . COPD (chronic obstructive pulmonary disease)   . Dehiscence of operative wound   . H/O hiatal hernia   . Type II diabetes mellitus   . History of blood transfusion     "some; related to surgeries & LGIB"  . GERD (gastroesophageal reflux disease)   . Stroke     "he's had 6 mini strokes/last CT scan, probably more since; stroke in right eye, can't see out of it since" (08/15/2014)  . Depression     Past Surgical History  Procedure Laterality Date  . Femoral bypass Right 12/29/2002    w/ Goretex   by Dr. Josephina Gip  . Femoral bypass Left 12/01/2001    Hattie Perch 03/11/2011  . 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 Above knee leg amputation Right 06/05/09  . Leg amputation above knee Right 04/11/2010    Revision of right above knee amputation  . Coronary  artery bypass graft  2002    CABG X4; By Tyrone Sage  . Below knee leg amputation Left 06/27/2011    Hattie Perch 06/27/2011  . Incision and drainage of wound Right 01/21/2003    distal thigh and popliteal space/notes 03/11/2011  . Cardiac catheterization  08/2001    Hattie Perch 03/11/2011  . Drainage and closure of lymphocele Left 01/17/2002    Hattie Perch 03/11/2011  . Debridement and closure wound Right 02/01/2003    partial closure of distal thigh & popliteal wound/notes 03/11/2011  . Femoral endarterectomy Right 07/12/2003    Hattie Perch 03/11/2011  . Femoropopliteal thrombectomy / embolectomy Right 01/29/2005    Hattie Perch 03/11/2011  . Femoral bypass Left 09/09/2005    Hattie Perch 03/11/2011  . Femoropopliteal thrombectomy / embolectomy Right 02/06/2006    Hattie Perch 03/11/2011  . Axillary-femoral bypass graft Right 02/06/2006    Hattie Perch 03/11/2011  . Thrombectomy / embolectomy axillary artery Right 02/08/2006    Hattie Perch 03/11/2011  . Debridement  foot Right 02/12/2006    & amputation right third toe/notes 03/11/2011  . Wound debridement Right 03/30/2006    BTK amputation/notes 03/11/2011  . Debridement and closure wound Right 04/23/2006; 04/26/2006    closure of BTK amputation/notes 03/11/2011  . Transmetatarsal amputation Left 06/24/2011    "#3, 4, & 5/notes 06/26/2011    Prior to Admission medications   Medication Sig Start Date End  Date Taking? Authorizing Provider  acetaminophen (TYLENOL) 325 MG tablet Take 650 mg by mouth every 6 (six) hours as needed (pain).   Yes Historical Provider, MD  atorvastatin (LIPITOR) 80 MG tablet Take 80 mg by mouth daily.   Yes Historical Provider, MD  benazepril-hydrochlorthiazide (LOTENSIN HCT) 10-12.5 MG tablet Take 1 tablet by mouth daily. 05/30/15  Yes Historical Provider, MD  diazepam (VALIUM) 10 MG tablet Take 5 mg by mouth daily as needed for anxiety.   Yes Historical Provider, MD  digoxin (LANOXIN) 0.125 MG tablet Take 125 mcg by mouth daily.     Yes Historical Provider, MD  diltiazem (CARDIZEM  CD) 180 MG 24 hr capsule Take 1 capsule (180 mg total) by mouth daily. 02/24/13  Yes Adrian Prince, MD  Docusate Calcium (STOOL SOFTENER PO) Take 1 tablet by mouth daily. 05/30/15  Yes Historical Provider, MD  fenofibrate 160 MG tablet Take 160 mg by mouth daily.     Yes Historical Provider, MD  furosemide (LASIX) 40 MG tablet Take 40 mg by mouth daily. 05/08/15  Yes Historical Provider, MD  glimepiride (AMARYL) 4 MG tablet Take 2 mg by mouth daily. 05/23/15  Yes Historical Provider, MD  insulin regular human CONCENTRATED (HUMULIN R) 500 UNIT/ML SOLN injection Inject 6 Units into the skin daily.    Yes Historical Provider, MD  levothyroxine (SYNTHROID, LEVOTHROID) 50 MCG tablet Take 50 mcg by mouth daily.     Yes Historical Provider, MD  metoprolol (LOPRESSOR) 50 MG tablet Take 25 mg by mouth 2 (two) times daily.    Yes Historical Provider, MD  Multiple Vitamin (MULTIVITAMIN) tablet Take 1 tablet by mouth daily.     Yes Historical Provider, MD  silver sulfADIAZINE (SILVADENE) 1 % cream Apply 1 application topically daily. 08/18/14  Yes Adrian Prince, MD  warfarin (COUMADIN) 3 MG tablet Take 1.5-3 mg by mouth daily. Take 3 mg by mouth on Monday and Friday. Take 1.5 mg by mouth on all other days.   Yes Historical Provider, MD    Scheduled Meds: . sodium chloride   Intravenous Once  . sodium chloride   Intravenous Once  . atorvastatin  80 mg Oral Daily  . digoxin  125 mcg Oral Daily  . feeding supplement (GLUCERNA SHAKE)  237 mL Oral TID BM  . fenofibrate  160 mg Oral Daily  . hydrocortisone sod succinate (SOLU-CORTEF) inj  50 mg Intravenous Q6H  . insulin aspart  0-15 Units Subcutaneous TID WC  . insulin aspart  0-5 Units Subcutaneous QHS  . levothyroxine  50 mcg Oral QAC breakfast  . pantoprazole (PROTONIX) IV  40 mg Intravenous Q12H  . piperacillin-tazobactam (ZOSYN)  IV  3.375 g Intravenous Q8H  . silver sulfADIAZINE  1 application Topical Daily  . sodium chloride  3 mL Intravenous Q12H  .  vancomycin  750 mg Intravenous Q24H  . warfarin  1.5 mg Oral Once per day on Sun Tue Thu Fri Sat  . [START ON 07/30/2015] warfarin  3 mg Oral Once per day on Mon Wed  . Warfarin - Pharmacist Dosing Inpatient   Does not apply q1800   Continuous Infusions: . lactated ringers Stopped (07/28/15 0628)   PRN Meds:.acetaminophen  Allergies as of 07/24/2015  . (No Known Allergies)    History reviewed. No pertinent family history.  Social History   Social History  . Marital Status: Widowed    Spouse Name: N/A  . Number of Children: N/A  . Years of Education: N/A  Occupational History  . Not on file.   Social History Main Topics  . Smoking status: Current Every Day Smoker -- 1.00 packs/day for 58 years    Types: Cigarettes  . Smokeless tobacco: Never Used  . Alcohol Use: Yes     Comment: 08/15/2014 "I aien't drank nothing in years"  . Drug Use: No  . Sexual Activity: No   Other Topics Concern  . Not on file   Social History Narrative    Review of Systems: All negative except as stated above in HPI.  Physical Exam: Vital signs: Filed Vitals:   07/28/15 1008  BP: 96/50  Pulse: 94  Temp: 97.5  Resp: 15   Last BM Date: 07/26/15 General:   Lethargic, frail, elderly, no acute distress Head: atraumatic Eyes: anicteric ENT: dry lips; oropharynx clear Lungs:  Coarse breath sounds. Heart: Irregularly irregular rhythm. Abdomen: soft, nontender, nondistended, +BS  Rectal:  Deferred Ext: upper extremities bandaged at hands; s/p lower extremity amputation Skin: no jaundice  GI:  Lab Results:  Recent Labs  07/26/15 1227 07/27/15 0616 07/28/15 0423  WBC 27.2* 25.9* 20.9*  HGB 7.2* 8.3* 6.6*  HCT 22.2* 24.7* 20.2*  PLT 207  214 205 183   BMET  Recent Labs  07/26/15 0540 07/27/15 0616 07/28/15 0423  NA 137 142 145  K 4.5 4.3 3.9  CL 111 113* 115*  CO2 19* 21* 22  GLUCOSE 235* 278* 150*  BUN 76* 61* 56*  CREATININE 1.45* 1.36* 1.09  CALCIUM 8.0* 8.2*  8.4*   LFT  Recent Labs  07/26/15 0540  PROT 5.0*  ALBUMIN 1.8*  AST 53*  ALT 41  ALKPHOS 31*  BILITOT 0.6   PT/INR  Recent Labs  07/27/15 1532 07/28/15 0423  LABPROT 21.4* 21.6*  INR 1.87* 1.89*     Studies/Results: No results found.  Impression/Plan: 69 yo with admit for sepsis and gangrene of the finger with amputation of finger yesterday being seen for anemia without any overt bleeding. Patient had a supratherapeutic INR of 8 during this admit so suspect the heme positive stool is due to colonic mucosal bleeding due to this anticoagulation or from a hemorrhoidal source. I think his anemia is multifactorial from his sepsis and recent surgery and I do NOT think he is having an active GI bleed that warrants any invasive GI procedures at this time. Would place on IV PPI Q 12 hours until on solid food. Clear liquid diet. Supportive care. HOLD COUMADIN for now and tonight's dose has been discontinued. Will follow.    LOS: 4 days   Zabrina Brotherton C.  07/28/2015, 11:29 AM  Pager [phone]  If no answer or after 5 PM call [phone]

## 2015-07-28 NOTE — Op Note (Signed)
[name], [name]               ACCOUNT NO.:  [id number]  MEDICAL RECORD NO.:  [id number]  LOCATION:  [facility]                         FACILITY:  [facility]  PHYSICIAN:  Betha Loa, MD        DATE OF BIRTH:  [birth year]  DATE OF PROCEDURE:  07/27/2015 DATE OF DISCHARGE:                              OPERATIVE REPORT   PREOPERATIVE DIAGNOSIS:  Right ring finger dry gangrene.  POSTOPERATIVE DIAGNOSIS:  Right ring finger dry gangrene.  PROCEDURE:  Right ring finger amputation.  SURGEON:  Betha Loa, M.D.  ASSISTANT:  None.  ANESTHESIA:  General.  IV FLUIDS:  Per anesthesia flow sheet.  ESTIMATED BLOOD LOSS:  Minimal.  COMPLICATIONS:  None.  SPECIMENS:  Right ring finger to Pathology for gross examination.  TOURNIQUET TIME:  34 minutes.  DISPOSITION:  Stable to PACU.  INDICATIONS:  [name] is a 69 year old male, who has had progressively worsening blood flow in his right ring finger.  This has resulted in dry gangrene.  He was admitted earlier this week.  I was consulted for management of the condition.  I discussed with [name] and his son, who is his power-of-attorney the nature of the condition. We discussed amputation of the finger to prevent infection.  Risks, benefits, and alternatives of surgery were discussed including risk of blood loss; infection; damage to nerves, vessels, tendons, ligaments or bone; failure of surgery; need for additional surgery; complications with wound healing, continued pain, and need for further surgery.  They voiced understanding of these risks and elected to proceed.  OPERATIVE COURSE:  After being identified preoperatively by myself, the patient and I agreed upon procedure and site of procedure.  Surgical site was marked.  The risks, benefits, and alternatives of surgery were reviewed and he wished to proceed.  Surgical consent had been signed. He is on scheduled antibiotics.  He was transferred to the operating room and placed  the operating table in supine position with the right upper extremity on arm board.  General anesthesia was induced by anesthesiologist.  The right upper extremity was prepped and draped in normal sterile orthopedic fashion.  Surgical pause was performed between surgeons, anesthesia, operating staff, and all were in agreement as to the patient, procedure, site procedure.  Tourniquet at the proximal aspect of the extremity was inflated to 250 mmHg after exsanguination of the limb with Esmarch bandage.  Incision was made at the right ring finger over the proximal phalanx.  This was carried into subcutaneous tissues by spreading technique.  Bipolar electrocautery was used to obtain hemostasis.  The digital nerves and arteries were identified and treated with bipolar electrocautery.  The arteries were tied with a 4-0 Vicryl suture.  The tendons were transected.  The finger was amputated through the MP joint.  It was sent to Pathology for gross examination. The wound was copiously irrigated with sterile saline.  The palmar thicker tissue was able to be brought over the wound, 5-0 Monocryl suture was used in a horizontal mattress fashion to reapproximate the skin edges.  The skin edges were able to be approximated without any undue tension.  The wound was injected with 5 mL of 0.25% plain  Marcaine to aid in postoperative analgesia.  It was then dressed with sterile Xeroform, 4x4s, and wrapped with a Kerlix and Coban dressing lightly. Tourniquet deflated at 34 minutes.  Fingertips were pink with brisk capillary refill after deflation of tourniquet.  The operative drapes were broken down.  The patient was awoken from Anesthesia safely.  He was transferred back to stretcher and taken to PACU in stable condition. He was admitted to the [facility].  He can be discharged from the hand surgery standpoint at any time.  He can follow up in the office in approximately 1 week.     Betha Loa, MD     KK/MEDQ  D:  07/27/2015  T:  07/28/2015  Job:  [id number]
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
## 2015-07-28 NOTE — Progress Notes (Signed)
ANTICOAGULATION CONSULT NOTE - Follow Up Consult  Pharmacy Consult: Coumadin and Antibiotics Indication: atrial fibrillation and r/o Sepsis  No Known Allergies  Patient Measurements: Height:  (180.3 cm) (prior to amputation) Weight: 140 lb (63.504 kg) IBW/kg (Calculated) : 75.3  Vital Signs: Temp: 97.5 F (36.4 C) (10/01 0900) Temp Source: Oral (10/01 0900) BP: 96/50 mmHg (10/01 0900) Pulse Rate: 96 (10/01 0900)  Labs:  Recent Labs  07/26/15 0540  07/26/15 0802 07/26/15 1227 07/27/15 0616 07/27/15 1246 07/27/15 1532 07/28/15 0423  HGB  --   < > 7.4* 7.2* 8.3*  --   --  6.6*  HCT  --   < > 22.3* 22.2* 24.7*  --   --  20.2*  PLT  --   < > 176 207  214 205  --   --  183  APTT  --   --   --  64*  --   --   --   --   LABPROT 36.0*  --   --  33.5* 38.4* 26.4* 21.4* 21.6*  INR 3.72*  --   --  3.39* 4.06* 2.46* 1.87* 1.89*  CREATININE 1.45*  --   --   --  1.36*  --   --  1.09  TROPONINI  --   --  0.37* 0.30*  --   --   --   --   < > = values in this interval not displayed. Estimated Creatinine Clearance: 56.6 mL/min (by C-G formula based on Cr of 1.09).  Assessment: 69 YOM on Coumadin PTA for history of AFib.  Patient's INR was supra-therapeutic on admit and Coumadin has been held. S/p reversal with vit K 2.5mg  po 9/28 after INR increased to 8.09. INR now down to  today and Vitamin K 2.5mg  PO was given. H/H improved this AM. Patient has a history of GIB, and heme + stool today.  He is s/p finger amputation yesterday with no overt bleeding reported.  His INR is now below goal of 2.0-3.0 and we will place him back on his home regimen.  Home Regimen:  He takes 3 mg on M/F and 1.5 mg all other days  Plan:  - Resume home dose of warfarin  - Daily PT / INR and CBC for now.  Nadara Mustard, PharmD., MS Clinical Pharmacist Pager:  [phone] Thank you for allowing pharmacy to be part of this patients care team. 07/28/2015, 9:29 AM

## 2015-07-29 ENCOUNTER — Encounter (HOSPITAL_COMMUNITY): Payer: Self-pay | Admitting: Orthopedic Surgery

## 2015-07-29 DIAGNOSIS — B999 Unspecified infectious disease: Secondary | ICD-10-CM

## 2015-07-29 LAB — PROTIME-INR
INR: 2.1 — ABNORMAL HIGH (ref 0.00–1.49)
INR: 2.67 — AB (ref 0.00–1.49)
PROTHROMBIN TIME: 28.1 s — AB (ref 11.6–15.2)
Prothrombin Time: 23.4 seconds — ABNORMAL HIGH (ref 11.6–15.2)

## 2015-07-29 LAB — CBC
HCT: 25.7 % — ABNORMAL LOW (ref 39.0–52.0)
Hemoglobin: 8.6 g/dL — ABNORMAL LOW (ref 13.0–17.0)
MCH: 28.9 pg (ref 26.0–34.0)
MCHC: 33.5 g/dL (ref 30.0–36.0)
MCV: 86.2 fL (ref 78.0–100.0)
PLATELETS: 164 10*3/uL (ref 150–400)
RBC: 2.98 MIL/uL — ABNORMAL LOW (ref 4.22–5.81)
RDW: 16.5 % — AB (ref 11.5–15.5)
WBC: 23.5 10*3/uL — AB (ref 4.0–10.5)

## 2015-07-29 LAB — GLUCOSE, CAPILLARY
GLUCOSE-CAPILLARY: 277 mg/dL — AB (ref 65–99)
GLUCOSE-CAPILLARY: 318 mg/dL — AB (ref 65–99)
Glucose-Capillary: 267 mg/dL — ABNORMAL HIGH (ref 65–99)
Glucose-Capillary: 213 mg/dL — ABNORMAL HIGH (ref 65–99)

## 2015-07-29 LAB — CULTURE, BLOOD (ROUTINE X 2)
Culture: NO GROWTH
Culture: NO GROWTH

## 2015-07-29 LAB — HEMOGLOBIN AND HEMATOCRIT, BLOOD
HCT: 21.5 % — ABNORMAL LOW (ref 39.0–52.0)
Hemoglobin: 7.2 g/dL — ABNORMAL LOW (ref 13.0–17.0)

## 2015-07-29 LAB — PREPARE RBC (CROSSMATCH)

## 2015-07-29 MED ORDER — SODIUM CHLORIDE 0.9 % IV SOLN: 80.0000 mg | Freq: Once | INTRAVENOUS | Status: DC

## 2015-07-29 MED ORDER — PANTOPRAZOLE SODIUM 40 MG IV SOLR: 40.0000 mg | Freq: Once | INTRAVENOUS | Status: DC

## 2015-07-29 MED ORDER — PANTOPRAZOLE SODIUM 40 MG IV SOLR
80.0000 mg | Freq: Once | INTRAVENOUS | Status: AC
  Administered 2015-07-29: 80 mg via INTRAVENOUS
  Filled 2015-07-29: qty 80

## 2015-07-29 MED ORDER — SODIUM CHLORIDE 0.9 % IV SOLN: Freq: Once | INTRAVENOUS | Status: DC

## 2015-07-29 MED ORDER — SODIUM CHLORIDE 0.9 % IV SOLN
8.0000 mg/h | INTRAVENOUS | Status: AC
  Administered 2015-07-29 – 2015-08-01 (×7): 8 mg/h via INTRAVENOUS
  Filled 2015-07-29 (×14): qty 80

## 2015-07-29 MED ORDER — PANTOPRAZOLE SODIUM 40 MG IV SOLR: 40.0000 mg | Freq: Two times a day (BID) | INTRAVENOUS | Status: DC

## 2015-07-29 NOTE — Progress Notes (Signed)
Patient ID: [name], male   DOB: [birth year], 69 y.o.   MRN: [id number] [name] Gastroenterology Progress Note  [name] 69 y.o. May 31, 1946   Subjective: Two large black stools this morning that he is unaware of. Denies abdominal pain.  Objective: Vital signs in last 24 hours: Filed Vitals:   07/29/15 1150  BP: 104/47  Pulse: 94  Temp: 97.8 F (36.6 Abbott)  Resp: 17    Physical Exam: Gen: lethargic, elderly, frail, no acute distress HEENT: anicteric CV: RRR Chest: CTA B Abd: soft, nontender, nondistended, +BS  Lab Results:  Recent Labs  07/27/15 0616 07/28/15 0423  NA 142 145  K 4.3 3.9  CL 113* 115*  CO2 21* 22  GLUCOSE 278* 150*  BUN 61* 56*  CREATININE 1.36* 1.09  CALCIUM 8.2* 8.4*   No results for input(s): AST, ALT, ALKPHOS, BILITOT, PROT, ALBUMIN in the last 72 hours.  Recent Labs  07/27/15 0616 07/28/15 0423 07/28/15 1853 07/29/15 0345  WBC 25.9* 20.9*  --  23.5*  NEUTROABS 22.5*  --   --   --   HGB 8.3* 6.6* 9.3* 8.6*  HCT 24.7* 20.2* 27.3* 25.7*  MCV 86.1 87.4  --  86.2  PLT 205 183  --  164    Recent Labs  07/28/15 0423 07/29/15 0345  LABPROT 21.6* 23.4*  INR 1.89* 2.10*   69 yo with admit for sepsis and gangrene of the finger with amputation of finger yesterday being seen for anemia without any overt bleeding. Patient had a supratherapeutic INR of 8 during this admit so suspect the heme positive stool is due to colonic mucosal bleeding due to this anticoagulation or from a hemorrhoidal source. I think his anemia is multifactorial from his sepsis and recent surgery and I do NOT think he is having an active GI bleed that warrants any invasive GI procedures at this time. Would place on IV PPI Q 12 hours until on solid food. Clear liquid diet. Supportive care. HOLD COUMADIN for now and tonight's dose has been discontinued. Will follow.   Assessment/Plan: 69 yo with recent sepsis and gangrene of the finger that was amputated on 07/27/15  who is now having melena. History of duodenal ulcer in 2010. Hold anticoagulation. Start Protonix drip. NPO p MN. EGD tomorrow morning by Dr. Randa Abbott. Follow H/Hs. Supportive care.   [name]. 07/29/2015, 12:22 PM  Pager [phone]  If no answer or after 5 PM call [phone]
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
## 2015-07-29 NOTE — Progress Notes (Signed)
ANTICOAGULATION CONSULT NOTE - Follow Up Consult  Pharmacy Consult: Coumadin  Indication: atrial fibrillation   No Known Allergies  Patient Measurements: Height:  (180.3 cm) (prior to amputation) Weight: 140 lb (63.504 kg) IBW/kg (Calculated) : 75.3  Vital Signs: Temp: 97.7 F (36.5 C) (10/02 0745) Temp Source: Oral (10/02 0745) BP: 104/46 mmHg (10/02 0700) Pulse Rate: 95 (10/02 0925)  Labs:  Recent Labs  07/26/15 1227 07/27/15 0616  07/27/15 1532 07/28/15 0423 07/28/15 1853 07/29/15 0345  HGB 7.2* 8.3*  --   --  6.6* 9.3* 8.6*  HCT 22.2* 24.7*  --   --  20.2* 27.3* 25.7*  PLT 207  214 205  --   --  183  --  164  APTT 64*  --   --   --   --   --   --   LABPROT 33.5* 38.4*  < > 21.4* 21.6*  --  23.4*  INR 3.39* 4.06*  < > 1.87* 1.89*  --  2.10*  CREATININE  --  1.36*  --   --  1.09  --   --   TROPONINI 0.30*  --   --   --   --   --   --   < > = values in this interval not displayed. Estimated Creatinine Clearance: 56.6 mL/min (by C-G formula based on Cr of 1.09).  Assessment: 69 YOM on Coumadin PTA for history of AFib.  Patient's INR was supra-therapeutic on admit and Coumadin has been held. S/p reversal with vit K 2.5mg  po 9/28 after INR increased to 8.09. INR now down to 2.1. His H/H has dropped again and GI discontinued Warfarin with plans to just hold for now.    Home Regimen:  He takes 3 mg on M/F and 1.5 mg all other days  Plan:  - Have discontinued pharmacy consult - please let us know if you'd like [place] to assist should Warfarin be resumed.   - Discontinued daily PT/INR    Nadara Mustard, PharmD., MS Clinical Pharmacist Pager:  [phone] Thank you for allowing pharmacy to be part of this patients care team. 07/29/2015, 11:15 AM

## 2015-07-30 ENCOUNTER — Inpatient Hospital Stay (HOSPITAL_COMMUNITY): Payer: Commercial Managed Care - HMO

## 2015-07-30 ENCOUNTER — Encounter (HOSPITAL_COMMUNITY): Payer: Self-pay | Admitting: *Deleted

## 2015-07-30 ENCOUNTER — Encounter (HOSPITAL_COMMUNITY)
Admission: EM | Disposition: A | Discharge status: Hospice | Payer: Self-pay | Source: Home / Self Care | Attending: Family Medicine

## 2015-07-30 ENCOUNTER — Inpatient Hospital Stay (HOSPITAL_COMMUNITY): Payer: Commercial Managed Care - HMO | Admitting: Anesthesiology

## 2015-07-30 DIAGNOSIS — D689 Coagulation defect, unspecified: Secondary | ICD-10-CM | POA: Insufficient documentation

## 2015-07-30 DIAGNOSIS — K254 Chronic or unspecified gastric ulcer with hemorrhage: Secondary | ICD-10-CM | POA: Insufficient documentation

## 2015-07-30 HISTORY — PX: ESOPHAGOGASTRODUODENOSCOPY (EGD) WITH PROPOFOL: SHX5813

## 2015-07-30 LAB — CBC
HCT: 28.9 % — ABNORMAL LOW (ref 39.0–52.0)
HEMATOCRIT: 30.1 % — AB (ref 39.0–52.0)
HEMOGLOBIN: 10.4 g/dL — AB (ref 13.0–17.0)
Hemoglobin: 9.8 g/dL — ABNORMAL LOW (ref 13.0–17.0)
MCH: 29.5 pg (ref 26.0–34.0)
MCH: 28.9 pg (ref 26.0–34.0)
MCHC: 34.6 g/dL (ref 30.0–36.0)
MCHC: 33.9 g/dL (ref 30.0–36.0)
MCV: 85.5 fL (ref 78.0–100.0)
MCV: 85.3 fL (ref 78.0–100.0)
PLATELETS: 189 10*3/uL (ref 150–400)
Platelets: 162 10*3/uL (ref 150–400)
RBC: 3.52 MIL/uL — ABNORMAL LOW (ref 4.22–5.81)
RBC: 3.39 MIL/uL — ABNORMAL LOW (ref 4.22–5.81)
RDW: 15.3 % (ref 11.5–15.5)
RDW: 15.6 % — ABNORMAL HIGH (ref 11.5–15.5)
WBC: 24.7 10*3/uL — ABNORMAL HIGH (ref 4.0–10.5)
WBC: 24.2 10*3/uL — ABNORMAL HIGH (ref 4.0–10.5)

## 2015-07-30 LAB — BASIC METABOLIC PANEL
Anion gap: 6 (ref 5–15)
BUN: 48 mg/dL — AB (ref 6–20)
CALCIUM: 7.8 mg/dL — AB (ref 8.9–10.3)
CHLORIDE: 113 mmol/L — AB (ref 101–111)
CO2: 25 mmol/L (ref 22–32)
CREATININE: 0.93 mg/dL (ref 0.61–1.24)
GFR calc Af Amer: >60 mL/min (ref >60)
GFR calc non Af Amer: >60 mL/min (ref >60)
GLUCOSE: 206 mg/dL — AB (ref 65–99)
Potassium: 3.2 mmol/L — ABNORMAL LOW (ref 3.5–5.1)
Sodium: 144 mmol/L (ref 135–145)

## 2015-07-30 LAB — TYPE AND SCREEN
ABO/RH(D): A POS
Antibody Screen: NEGATIVE
UNIT DIVISION: 0
UNIT DIVISION: 0
UNIT DIVISION: 0
UNIT DIVISION: 0
Unit division: 0
Unit division: 0

## 2015-07-30 LAB — GLUCOSE, CAPILLARY
GLUCOSE-CAPILLARY: 197 mg/dL — AB (ref 65–99)
Glucose-Capillary: 212 mg/dL — ABNORMAL HIGH (ref 65–99)
Glucose-Capillary: 169 mg/dL — ABNORMAL HIGH (ref 65–99)
Glucose-Capillary: 185 mg/dL — ABNORMAL HIGH (ref 65–99)

## 2015-07-30 LAB — HEMOGLOBIN AND HEMATOCRIT, BLOOD
HCT: 30.3 % — ABNORMAL LOW (ref 39.0–52.0)
Hemoglobin: 10.5 g/dL — ABNORMAL LOW (ref 13.0–17.0)

## 2015-07-30 LAB — HEPATIC FUNCTION PANEL
ALK PHOS: 47 U/L (ref 38–126)
ALT: 85 U/L — AB (ref 17–63)
AST: 81 U/L — ABNORMAL HIGH (ref 15–41)
Albumin: 1.6 g/dL — ABNORMAL LOW (ref 3.5–5.0)
BILIRUBIN DIRECT: 0.2 mg/dL (ref 0.1–0.5)
BILIRUBIN INDIRECT: 0.3 mg/dL (ref 0.3–0.9)
Total Bilirubin: 0.5 mg/dL (ref 0.3–1.2)
Total Protein: 4.5 g/dL — ABNORMAL LOW (ref 6.5–8.1)

## 2015-07-30 LAB — PROTIME-INR
INR: 2.64 — ABNORMAL HIGH (ref 0.00–1.49)
Prothrombin Time: 27.8 seconds — ABNORMAL HIGH (ref 11.6–15.2)

## 2015-07-30 SURGERY — ESOPHAGOGASTRODUODENOSCOPY (EGD) WITH PROPOFOL
Anesthesia: Monitor Anesthesia Care

## 2015-07-30 MED ORDER — PHENYLEPHRINE HCL 10 MG/ML IJ SOLN
INTRAMUSCULAR | Status: DC | PRN
  Administered 2015-07-30 (×2): 80 ug via INTRAVENOUS

## 2015-07-30 MED ORDER — BUTAMBEN-TETRACAINE-BENZOCAINE 2-2-14 % EX AERO
INHALATION_SPRAY | CUTANEOUS | Status: DC | PRN
  Administered 2015-07-30: 2 via TOPICAL

## 2015-07-30 MED ORDER — PROMETHAZINE HCL 25 MG/ML IJ SOLN: 6.2500 mg | INTRAMUSCULAR | Status: DC | PRN

## 2015-07-30 MED ORDER — LIDOCAINE HCL (CARDIAC) 20 MG/ML IV SOLN
INTRAVENOUS | Status: DC | PRN
  Administered 2015-07-30: 50 mg via INTRAVENOUS

## 2015-07-30 MED ORDER — POTASSIUM CHLORIDE CRYS ER 20 MEQ PO TBCR
40.0000 meq | EXTENDED_RELEASE_TABLET | Freq: Once | ORAL | Status: AC
  Administered 2015-07-30: 40 meq via ORAL
  Filled 2015-07-30: qty 2

## 2015-07-30 MED ORDER — HYDROCORTISONE NA SUCCINATE PF 100 MG IJ SOLR
50.0000 mg | Freq: Two times a day (BID) | INTRAMUSCULAR | Status: DC
  Administered 2015-07-30 – 2015-07-31 (×2): 50 mg via INTRAVENOUS
  Filled 2015-07-30: qty 2
  Filled 2015-07-30: qty 1
  Filled 2015-07-30: qty 2
  Filled 2015-07-30: qty 1

## 2015-07-30 MED ORDER — PROPOFOL 500 MG/50ML IV EMUL
INTRAVENOUS | Status: DC | PRN
  Administered 2015-07-30: 50 ug/kg/min via INTRAVENOUS

## 2015-07-30 NOTE — Interval H&P Note (Signed)
History and Physical Interval Note:  07/30/2015 9:49 AM  [name]  has presented today for surgery, with the diagnosis of melena  The various methods of treatment have been discussed with the patient and family. After consideration of risks, benefits and other options for treatment, the patient has consented to  Procedure(s): ESOPHAGOGASTRODUODENOSCOPY (EGD) WITH PROPOFOL (N/A) as a surgical intervention .  The patient's history has been reviewed, patient examined, no change in status, stable for surgery.  I have reviewed the patient's chart and labs.  Questions were answered to the patient's satisfaction.     Jalan Fariss JR,Shawnmichael Parenteau L
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
## 2015-07-30 NOTE — H&P (View-Only) (Signed)
Patient ID: [name], male   DOB: 02/12/1946, 69 y.o.   MRN: [id number] [facility] Progress Note  [name] 69 y.o. 05/28/1946   Subjective: Two large black stools this morning that he is unaware of. Denies abdominal pain.  Objective: Vital signs in last 24 hours: Filed Vitals:   07/29/15 1150  BP: 104/47  Pulse: 94  Temp: 97.8 F (36.6 C)  Resp: 17    Physical Exam: Gen: lethargic, elderly, frail, no acute distress HEENT: anicteric CV: RRR Chest: CTA B Abd: soft, nontender, nondistended, +BS  Lab Results:  Recent Labs  07/27/15 0616 07/28/15 0423  NA 142 145  K 4.3 3.9  CL 113* 115*  CO2 21* 22  GLUCOSE 278* 150*  BUN 61* 56*  CREATININE 1.36* 1.09  CALCIUM 8.2* 8.4*   No results for input(s): AST, ALT, ALKPHOS, BILITOT, PROT, ALBUMIN in the last 72 hours.  Recent Labs  07/27/15 0616 07/28/15 0423 07/28/15 1853 07/29/15 0345  WBC 25.9* 20.9*  --  23.5*  NEUTROABS 22.5*  --   --   --   HGB 8.3* 6.6* 9.3* 8.6*  HCT 24.7* 20.2* 27.3* 25.7*  MCV 86.1 87.4  --  86.2  PLT 205 183  --  164    Recent Labs  07/28/15 0423 07/29/15 0345  LABPROT 21.6* 23.4*  INR 1.89* 2.10*   69 yo with admit for sepsis and gangrene of the finger with amputation of finger yesterday being seen for anemia without any overt bleeding. Patient had a supratherapeutic INR of 8 during this admit so suspect the heme positive stool is due to colonic mucosal bleeding due to this anticoagulation or from a hemorrhoidal source. I think his anemia is multifactorial from his sepsis and recent surgery and I do NOT think he is having an active GI bleed that warrants any invasive GI procedures at this time. Would place on IV PPI Q 12 hours until on solid food. Clear liquid diet. Supportive care. HOLD COUMADIN for now and tonight's dose has been discontinued. Will follow.   Assessment/Plan: 69 yo with recent sepsis and gangrene of the finger that was amputated on 07/27/15  who is now having melena. History of duodenal ulcer in 2010. Hold anticoagulation. Start Protonix drip. NPO p MN. EGD tomorrow morning by Dr. Edwards. Follow H/Hs. Supportive care.   [name]. 07/29/2015, 12:22 PM  Pager [phone]  If no answer or after 5 PM call [phone]  

## 2015-07-30 NOTE — Transfer of Care (Signed)
Immediate Anesthesia Transfer of Care Note  Patient: [name]  Procedure(s) Performed: Procedure(s): ESOPHAGOGASTRODUODENOSCOPY (EGD) WITH PROPOFOL (N/A)  Patient Location: Endoscopy Unit  Anesthesia Type:MAC  Level of Consciousness: awake, alert , oriented and pateint uncooperative  Airway & Oxygen Therapy: Patient Spontanous Breathing and Patient connected to face mask oxygen  Post-op Assessment: Report given to RN and Post -op Vital signs reviewed and stable  Post vital signs: Reviewed and stable  Last Vitals:  Filed Vitals:   07/30/15 1026  BP: 184/91  Pulse: 72  Temp: 36.8 C  Resp: 18    Complications: No apparent anesthesia complications

## 2015-07-30 NOTE — Anesthesia Preprocedure Evaluation (Addendum)
Anesthesia Evaluation  Patient identified by MRN, date of birth, ID band Patient awake    Reviewed: Allergy & Precautions, NPO status , Patient's Chart, lab work & pertinent test results  Airway Mallampati: II  TM Distance: >3 FB Neck ROM: Full    Dental no notable dental hx.    Pulmonary COPD, Current Smoker,    Pulmonary exam normal breath sounds clear to auscultation       Cardiovascular hypertension, + CAD and + Peripheral Vascular Disease  Normal cardiovascular exam+ dysrhythmias Atrial Fibrillation  Rhythm:Regular Rate:Normal     Neuro/Psych Depression CVA    GI/Hepatic negative GI ROS, Neg liver ROS,   Endo/Other  diabetes, Insulin DependentHypothyroidism   Renal/GU negative Renal ROS  negative genitourinary   Musculoskeletal negative musculoskeletal ROS (+)   Abdominal   Peds negative pediatric ROS (+)  Hematology  (+) anemia ,   Anesthesia Other Findings   Reproductive/Obstetrics negative OB ROS                            Anesthesia Physical Anesthesia Plan  ASA: III  Anesthesia Plan: MAC   Post-op Pain Management:    Induction: Intravenous  Airway Management Planned: Nasal Cannula  Additional Equipment:   Intra-op Plan:   Post-operative Plan:   Informed Consent: I have reviewed the patients History and Physical, chart, labs and discussed the procedure including the risks, benefits and alternatives for the proposed anesthesia with the patient or authorized representative who has indicated his/her understanding and acceptance.   Dental advisory given  Plan Discussed with: CRNA and Surgeon  Anesthesia Plan Comments: (INR > 2.4 GI MD aware)       Anesthesia Quick Evaluation

## 2015-07-30 NOTE — Anesthesia Postprocedure Evaluation (Signed)
  Anesthesia Post-op Note  Patient: [name]  Procedure(s) Performed: Procedure(s) (LRB): ESOPHAGOGASTRODUODENOSCOPY (EGD) WITH PROPOFOL (N/A)  Patient Location: PACU  Anesthesia Type: MAC  Level of Consciousness: awake and alert   Airway and Oxygen Therapy: Patient Spontanous Breathing  Post-op Pain: mild  Post-op Assessment: Post-op Vital signs reviewed, Patient's Cardiovascular Status Stable, Respiratory Function Stable, Patent Airway and No signs of Nausea or vomiting  Last Vitals:  Filed Vitals:   07/30/15 1219  BP: 116/49  Pulse: 111  Temp: 36.4 C  Resp: 16    Post-op Vital Signs: stable   Complications: No apparent anesthesia complications

## 2015-07-30 NOTE — Care Management Important Message (Signed)
Important Message  Patient Details  Name: MARQUELLE MUSGRAVE MRN: [id number] Date of Birth: 03-08-1946   Medicare Important Message Given:  Yes-third notification given    Orson Aloe 07/30/2015, 12:46 PM

## 2015-07-30 NOTE — Op Note (Signed)
[facility] [address] [place], [address]   ENDOSCOPY PROCEDURE REPORT  PATIENT: Phi, Avans  MR#: [id number] BIRTHDATE: [birth year] , 69  yrs. old GENDER: male ENDOSCOPIST:Katryn Plummer Randa Evens, MD REFERRED BY: Family Practice Service. PCP: Dr. Evlyn Kanner PROCEDURE DATE:  07/30/2015 PROCEDURE:   EGD ASA CLASS:    class IV INDICATIONS: G.I. bleeding and gentlemen on Coumadin for multiple problems MEDICATION: propofol 140 mg IV TOPICAL ANESTHETIC:   Cetacaine Spray  DESCRIPTION OF PROCEDURE:   After the risks and benefits of the procedure were explained, informed consent was obtained.  The Pentax Gastroscope [id number]  endoscope was introduced through the mouth  and advanced to the second portion of the duodenum . the patient had a very large, and abdominal pressure was required in order to pass the scope into the duodenum. After passage into the duodenum, there was some irritation and old bloody material in the duodenum but no gross ulcer was seen. Scope was an withdrawn into the stomach. The instrument was slowly withdrawn as the mucosa was fully examined. Estimated blood loss is zero unless otherwise noted in this procedure report. The stomach was carefully examined. There was a shallow irregular 2 centimeter gastric ulcer along the greater curve that was not actively bleeding. There was apparent clot. This was irrigated in the clot did not wash off. The stomach was otherwise normal in the forward and retro flex view. There was slight inflammation in the esophagus product GE junction upon withdrawal scope.    Retroflexed views revealed no abnormalities.    The scope was then withdrawn from the patient and the procedure completed.  COMPLICATIONS: There were no immediate complications.  ENDOSCOPIC IMPRESSION: 1. Gastric Ulcer not actively bleeding. 2. Bleeding in duodenal bulb. This could've been scope trauma. No ulcer crater was  seen. RECOMMENDATIONS: 1. will go ahead and resume the patient on clear liquids. Would keep him on PPI IV until his hemoglobin is stable. 2. He will need upper G.I. or repeat EGD to document healing of the ulcer and 6 to 8 weeks.   _______________________________ Rosalie DoctorCarman Ching, MD 07/30/2015 10:33 AM     cc: Dr. Evlyn Kanner  CPT CODES: ICD CODES:  The ICD and CPT codes recommended by this software are interpretations from the data that the clinical staff has captured with the software.  The verification of the translation of this report to the ICD and CPT codes and modifiers is the sole responsibility of the health care institution and practicing physician where this report was generated.  [employer] will not be held responsible for the validity of the ICD and CPT codes included on this report.  AMA assumes no liability for data contained or not contained herein. CPT is a Publishing rights manager of the [employer].

## 2015-07-30 NOTE — Progress Notes (Signed)
OT NOTE  Pt is total (A) and bedbound per OT previous notes. OT defer back to d/c note 07/27/15.   Mateo Flow   OTR/L Pager: [phone] Office: [phone] .

## 2015-07-31 ENCOUNTER — Inpatient Hospital Stay (HOSPITAL_COMMUNITY): Payer: Commercial Managed Care - HMO

## 2015-07-31 ENCOUNTER — Encounter (HOSPITAL_COMMUNITY): Payer: Self-pay | Admitting: Gastroenterology

## 2015-07-31 DIAGNOSIS — K254 Chronic or unspecified gastric ulcer with hemorrhage: Secondary | ICD-10-CM

## 2015-07-31 DIAGNOSIS — R791 Abnormal coagulation profile: Secondary | ICD-10-CM

## 2015-07-31 DIAGNOSIS — D689 Coagulation defect, unspecified: Secondary | ICD-10-CM

## 2015-07-31 LAB — CBC
HCT: 23.6 % — ABNORMAL LOW (ref 39.0–52.0)
HCT: 18.2 % — ABNORMAL LOW (ref 39.0–52.0)
HEMOGLOBIN: 6.1 g/dL — AB (ref 13.0–17.0)
Hemoglobin: 8.3 g/dL — ABNORMAL LOW (ref 13.0–17.0)
MCH: 29.6 pg (ref 26.0–34.0)
MCH: 29.3 pg (ref 26.0–34.0)
MCHC: 35.2 g/dL (ref 30.0–36.0)
MCHC: 33.5 g/dL (ref 30.0–36.0)
MCV: 84.3 fL (ref 78.0–100.0)
MCV: 87.5 fL (ref 78.0–100.0)
PLATELETS: 182 10*3/uL (ref 150–400)
Platelets: 161 10*3/uL (ref 150–400)
RBC: 2.8 MIL/uL — AB (ref 4.22–5.81)
RBC: 2.08 MIL/uL — AB (ref 4.22–5.81)
RDW: 16.1 % — AB (ref 11.5–15.5)
RDW: 17.2 % — ABNORMAL HIGH (ref 11.5–15.5)
WBC: 24.9 10*3/uL — AB (ref 4.0–10.5)
WBC: 22.4 10*3/uL — AB (ref 4.0–10.5)

## 2015-07-31 LAB — BASIC METABOLIC PANEL
Anion gap: 8 (ref 5–15)
BUN: 55 mg/dL — AB (ref 6–20)
CALCIUM: 7.5 mg/dL — AB (ref 8.9–10.3)
CO2: 21 mmol/L — ABNORMAL LOW (ref 22–32)
CREATININE: 1.05 mg/dL (ref 0.61–1.24)
Chloride: 116 mmol/L — ABNORMAL HIGH (ref 101–111)
Glucose, Bld: 241 mg/dL — ABNORMAL HIGH (ref 65–99)
Potassium: 4.2 mmol/L (ref 3.5–5.1)
SODIUM: 145 mmol/L (ref 135–145)

## 2015-07-31 LAB — HEPATITIS PANEL, ACUTE
HCV Ab: <0.1 s/co ratio (ref 0.0–0.9)
HEP B S AG: NEGATIVE
Hep A IgM: NEGATIVE
Hep B C IgM: NEGATIVE

## 2015-07-31 LAB — PROTIME-INR
INR: 1.94 — AB (ref 0.00–1.49)
PROTHROMBIN TIME: 22 s — AB (ref 11.6–15.2)

## 2015-07-31 LAB — PROCALCITONIN

## 2015-07-31 LAB — TROPONIN I: TROPONIN I: 0.39 ng/mL — AB (ref <0.031)

## 2015-07-31 LAB — GLUCOSE, CAPILLARY
GLUCOSE-CAPILLARY: 206 mg/dL — AB (ref 65–99)
Glucose-Capillary: 270 mg/dL — ABNORMAL HIGH (ref 65–99)
Glucose-Capillary: 242 mg/dL — ABNORMAL HIGH (ref 65–99)
Glucose-Capillary: 211 mg/dL — ABNORMAL HIGH (ref 65–99)

## 2015-07-31 LAB — LACTIC ACID, PLASMA: Lactic Acid, Venous: 4.8 mmol/L (ref 0.5–2.0)

## 2015-07-31 MED ORDER — SODIUM CHLORIDE 0.9 % IV SOLN
Freq: Once | INTRAVENOUS | Status: AC
  Administered 2015-07-31: via INTRAVENOUS

## 2015-07-31 MED ORDER — HYDROCORTISONE NA SUCCINATE PF 100 MG IJ SOLR
50.0000 mg | Freq: Four times a day (QID) | INTRAMUSCULAR | Status: DC
  Administered 2015-07-31 – 2015-08-04 (×17): 50 mg via INTRAVENOUS
  Filled 2015-07-31 (×16): qty 2

## 2015-07-31 MED ORDER — SODIUM CHLORIDE 0.9 % IV BOLUS (SEPSIS)
1000.0000 mL | Freq: Once | INTRAVENOUS | Status: AC
  Administered 2015-07-31: 1000 mL via INTRAVENOUS

## 2015-07-31 MED ORDER — VITAMIN K1 10 MG/ML IJ SOLN
10.0000 mg | Freq: Once | INTRAVENOUS | Status: AC
  Administered 2015-07-31: 10 mg via INTRAVENOUS
  Filled 2015-07-31: qty 1

## 2015-07-31 MED ORDER — LIDOCAINE HCL 1 % IJ SOLN
INTRAMUSCULAR | Status: AC
  Filled 2015-07-31: qty 20

## 2015-07-31 MED ORDER — SODIUM CHLORIDE 0.9 % IV SOLN: Freq: Once | INTRAVENOUS | Status: DC

## 2015-07-31 NOTE — Progress Notes (Signed)
Lab unable to get blood draws after multiple attempts. BP 72/33 at this time. Dr. Cathlean Cower notified. Awaiting orders.

## 2015-07-31 NOTE — Progress Notes (Signed)
Patient blood pressure 77/31. Dr. Cathlean Cower notified Will give 1 liter bolus ns
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
## 2015-07-31 NOTE — Progress Notes (Signed)
CRITICAL VALUE ALERT  Critical value received:  hgb 6.1  Date of notification:  07/31/15  Time of notification:  2308  Critical value read back: yes  Nurse who received alert:  Rosamaria Lints, RN  MD notified (1st page):  Resident currently here seeing pt, notified, orders in place for PRBC.
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
## 2015-07-31 NOTE — Progress Notes (Signed)
Patient FFP started 1500. Increased to 500 ml an hour at 1513. IR on their way to pick patient up for PICC placement. Attempting to have 1st unit FFP om before transport.

## 2015-07-31 NOTE — Progress Notes (Signed)
[facility] PROGRESS NOTE Subjective No gross bleeding, EGD showed blood in stomach and duodenum, GU not actively bleeding, INR was elevated.  Objective: Vital signs in last 24 hours: Temp:  [96.3 F (35.7 C)-98.3 F (36.8 C)] 97.1 F (36.2 C) (10/04 0817) Pulse Rate:  [59-111] 86 (10/04 0817) Resp:  [6-20] 15 (10/04 0817) BP: (66-184)/(18-91) 66/48 mmHg (10/04 0817) SpO2:  [96 %-100 %] 99 % (10/04 0817) Weight:  [63.504 kg (140 lb)] 63.504 kg (140 lb) (10/04 0439) Last BM Date: 07/31/15  Intake/Output from previous day: 10/03 0701 - 10/04 0700 In: 3610 [P.O.:60; I.V.:3550] Out: 1491 [Urine:1475; Stool:1; Blood:15] Intake/Output this shift:    PE: General--NAD  Abdomen--soft nontender  Lab Results:  Recent Labs  07/29/15 0345 07/29/15 1600 07/30/15 0552 07/30/15 1658 07/31/15 0403  WBC 23.5*  --  24.7* 24.2* 24.9*  HGB 8.6* 7.2* 10.4*  10.5* 9.8* 8.3*  HCT 25.7* 21.5* 30.1*  30.3* 28.9* 23.6*  PLT 164  --  162 189 182   BMET  Recent Labs  07/30/15 0552 07/31/15 0403  NA 144 145  K 3.2* 4.2  CL 113* 116*  CO2 25 21*  CREATININE 0.93 1.05   LFT  Recent Labs  07/30/15 1209  PROT 4.5*  AST 81*  ALT 85*  ALKPHOS 47  BILITOT 0.5  BILIDIR 0.2  IBILI 0.3   PT/INR  Recent Labs  07/29/15 0345 07/29/15 1600 07/30/15 0552  LABPROT 23.4* 28.1* 27.8*  INR 2.10* 2.67* 2.64*   PANCREAS No results for input(s): LIPASE in the last 72 hours.       Studies/Results: US Abdomen Limited Ruq  07/30/2015   CLINICAL DATA:  GI bleed with melena. Elevated INR. Initial encounter.  EXAM: US ABDOMEN LIMITED - RIGHT UPPER QUADRANT  COMPARISON:  CT 08/15/2014.  FINDINGS: Gallbladder:  Several small gallstones are noted as seen on prior CT. There is mild gallbladder wall thickening but no sonographic Murphy's sign.  Common bile duct:  Diameter: 3.1 mm  Liver:  The liver is heterogeneous in echotexture with mild contour irregularity, suspicious for  cirrhosis. Mild perihepatic ascites is noted. A small right pleural effusion is also noted.  IMPRESSION: 1. Cholelithiasis with nonspecific mild gallbladder wall thickening, possibly related to adjacent liver disease. Negative sonographic Murphy's sign. 2. Heterogeneity of the hepatic echotexture with contour irregularity suspicious for cirrhosis. Mild ascites.   Electronically Signed   By: Carey Bullocks M.D.   On: 07/30/2015 18:45    Medications: I have reviewed the patient's current medications.  Assessment/Plan: 1. UGI Bleed. UGI inflammation and ulceration in face of increased INR. Would continue high dose PPI and allow INR to return to normal. Should be ok to change to bolus PPI.   Valeria Krisko JR,Maddix Heinz L 07/31/2015, 8:48 AM  Pager: [phone] If no answer or after hours call [phone]

## 2015-07-31 NOTE — Progress Notes (Signed)
Picc line nurse unable to do Picc line today. Dr. Pollie Meyer notified. BP 77/24 while physician in room. Sent for FFP.

## 2015-07-31 NOTE — Procedures (Signed)
Interventional Radiology Procedure Note  Procedure: Placement of a left UE PICC.  Double lumen, power injectable.  47cm.  Tip is positioned at the superior cavoatrial junction and catheter is ready for immediate use.  Complications: None Recommendations:    - Do not submerge   - Routine line care   Signed,  Yvone Neu. Loreta Ave, DO
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
## 2015-08-01 DIAGNOSIS — Z7189 Other specified counseling: Secondary | ICD-10-CM | POA: Insufficient documentation

## 2015-08-01 DIAGNOSIS — Z515 Encounter for palliative care: Secondary | ICD-10-CM | POA: Insufficient documentation

## 2015-08-01 LAB — CBC
HCT: 25.6 % — ABNORMAL LOW (ref 39.0–52.0)
HCT: 22.1 % — ABNORMAL LOW (ref 39.0–52.0)
HEMATOCRIT: 21.9 % — AB (ref 39.0–52.0)
HEMATOCRIT: 23.2 % — AB (ref 39.0–52.0)
HEMOGLOBIN: 7.3 g/dL — AB (ref 13.0–17.0)
HEMOGLOBIN: 7.6 g/dL — AB (ref 13.0–17.0)
Hemoglobin: 8.8 g/dL — ABNORMAL LOW (ref 13.0–17.0)
Hemoglobin: 7.9 g/dL — ABNORMAL LOW (ref 13.0–17.0)
MCH: 29 pg (ref 26.0–34.0)
MCH: 29.5 pg (ref 26.0–34.0)
MCH: 29.6 pg (ref 26.0–34.0)
MCH: 29.6 pg (ref 26.0–34.0)
MCHC: 33.3 g/dL (ref 30.0–36.0)
MCHC: 34.4 g/dL (ref 30.0–36.0)
MCHC: 34.4 g/dL (ref 30.0–36.0)
MCHC: 34.1 g/dL (ref 30.0–36.0)
MCV: 86.9 fL (ref 78.0–100.0)
MCV: 85.9 fL (ref 78.0–100.0)
MCV: 86 fL (ref 78.0–100.0)
MCV: 86.9 fL (ref 78.0–100.0)
PLATELETS: 133 10*3/uL — AB (ref 150–400)
PLATELETS: 145 10*3/uL — AB (ref 150–400)
Platelets: 139 10*3/uL — ABNORMAL LOW (ref 150–400)
Platelets: 142 10*3/uL — ABNORMAL LOW (ref 150–400)
RBC: 2.52 MIL/uL — AB (ref 4.22–5.81)
RBC: 2.98 MIL/uL — AB (ref 4.22–5.81)
RBC: 2.57 MIL/uL — AB (ref 4.22–5.81)
RBC: 2.67 MIL/uL — ABNORMAL LOW (ref 4.22–5.81)
RDW: 16.1 % — ABNORMAL HIGH (ref 11.5–15.5)
RDW: 16.2 % — AB (ref 11.5–15.5)
RDW: 16.6 % — ABNORMAL HIGH (ref 11.5–15.5)
RDW: 17.1 % — AB (ref 11.5–15.5)
WBC: 21.1 10*3/uL — ABNORMAL HIGH (ref 4.0–10.5)
WBC: 23.2 10*3/uL — AB (ref 4.0–10.5)
WBC: 20.5 10*3/uL — ABNORMAL HIGH (ref 4.0–10.5)
WBC: 21.7 10*3/uL — AB (ref 4.0–10.5)

## 2015-08-01 LAB — BASIC METABOLIC PANEL
ANION GAP: 6 (ref 5–15)
BUN: 36 mg/dL — ABNORMAL HIGH (ref 6–20)
CHLORIDE: 113 mmol/L — AB (ref 101–111)
CO2: 22 mmol/L (ref 22–32)
Calcium: 7.2 mg/dL — ABNORMAL LOW (ref 8.9–10.3)
Creatinine, Ser: 0.9 mg/dL (ref 0.61–1.24)
GFR calc non Af Amer: >60 mL/min (ref >60)
Glucose, Bld: 170 mg/dL — ABNORMAL HIGH (ref 65–99)
Potassium: 3.5 mmol/L (ref 3.5–5.1)
Sodium: 141 mmol/L (ref 135–145)

## 2015-08-01 LAB — PREPARE FRESH FROZEN PLASMA
UNIT DIVISION: 0
Unit division: 0

## 2015-08-01 LAB — PROCALCITONIN: Procalcitonin: <0.1 ng/mL

## 2015-08-01 LAB — LACTIC ACID, PLASMA
LACTIC ACID, VENOUS: 3.6 mmol/L — AB (ref 0.5–2.0)
LACTIC ACID, VENOUS: 4.3 mmol/L — AB (ref 0.5–2.0)
Lactic Acid, Venous: 3.6 mmol/L (ref 0.5–2.0)
Lactic Acid, Venous: 4.6 mmol/L (ref 0.5–2.0)

## 2015-08-01 LAB — PROTIME-INR
INR: 1.57 — ABNORMAL HIGH (ref 0.00–1.49)
Prothrombin Time: 18.8 seconds — ABNORMAL HIGH (ref 11.6–15.2)

## 2015-08-01 LAB — GLUCOSE, CAPILLARY
GLUCOSE-CAPILLARY: 242 mg/dL — AB (ref 65–99)
Glucose-Capillary: 164 mg/dL — ABNORMAL HIGH (ref 65–99)
Glucose-Capillary: 213 mg/dL — ABNORMAL HIGH (ref 65–99)
Glucose-Capillary: 292 mg/dL — ABNORMAL HIGH (ref 65–99)

## 2015-08-01 LAB — HEPATIC FUNCTION PANEL
ALBUMIN: 1.6 g/dL — AB (ref 3.5–5.0)
ALK PHOS: 37 U/L — AB (ref 38–126)
ALT: 57 U/L (ref 17–63)
AST: 55 U/L — ABNORMAL HIGH (ref 15–41)
BILIRUBIN INDIRECT: 0.7 mg/dL (ref 0.3–0.9)
BILIRUBIN TOTAL: 1.1 mg/dL (ref 0.3–1.2)
Bilirubin, Direct: 0.4 mg/dL (ref 0.1–0.5)
Total Protein: 3.1 g/dL — ABNORMAL LOW (ref 6.5–8.1)

## 2015-08-01 LAB — PREPARE RBC (CROSSMATCH)

## 2015-08-01 LAB — DIGOXIN LEVEL: Digoxin Level: 0.8 ng/mL (ref 0.8–2.0)

## 2015-08-01 LAB — TROPONIN I
TROPONIN I: 0.39 ng/mL — AB (ref <0.031)
TROPONIN I: 0.51 ng/mL — AB (ref <0.031)
TROPONIN I: 0.55 ng/mL — AB (ref <0.031)
Troponin I: 0.54 ng/mL (ref <0.031)

## 2015-08-01 MED ORDER — SODIUM CHLORIDE 0.9 % IV SOLN
INTRAVENOUS | Status: DC
  Administered 2015-08-01: 21:00:00 via INTRAVENOUS
  Filled 2015-08-01: qty 1000

## 2015-08-01 MED ORDER — SODIUM CHLORIDE 0.9 % IV BOLUS (SEPSIS)
1000.0000 mL | Freq: Once | INTRAVENOUS | Status: AC
  Administered 2015-08-01: 1000 mL via INTRAVENOUS

## 2015-08-01 MED ORDER — SODIUM CHLORIDE 0.9 % IV SOLN
Freq: Once | INTRAVENOUS | Status: AC
  Administered 2015-08-01: 03:00:00 via INTRAVENOUS

## 2015-08-01 MED ORDER — ATORVASTATIN CALCIUM 40 MG PO TABS
40.0000 mg | ORAL_TABLET | Freq: Every day | ORAL | Status: DC
  Administered 2015-08-02 – 2015-08-07 (×6): 40 mg via ORAL
  Filled 2015-08-01 (×6): qty 1

## 2015-08-01 NOTE — Progress Notes (Signed)
Patient's son spoke with palliative care Dr. And case manager. Son told RN he will telephone Dr. With his decision over hospice.
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
## 2015-08-01 NOTE — Progress Notes (Signed)
Inpatient Diabetes Program Recommendations  AACE/ADA: New Consensus Statement on Inpatient Glycemic Control (2015)  Target Ranges:  Prepandial:   less than 140 mg/dL      Peak postprandial:   less than 180 mg/dL (1-2 hours)      Critically ill patients:  140 - 180 mg/dL   Review of Glycemic Control Glucose remains in primarily in 200's range. While on solucortef please consider addition of lantus or levemir at 10 units daily or HS.  Thank you Lenor Coffin, RN, MSN, CDE  Diabetes Inpatient Program Office: [phone] Pager: [phone] 8:00 am to 5:00 pm

## 2015-08-01 NOTE — Progress Notes (Signed)
FPTS Interim Progress Note  S: Patient's hemoglobin dropped to 6.1 tonight. Lactic Acid is 4.8. He appears very pale but is not complaining of any chest pain, palpitations, dizziness, or shortness of breath. Continues to be disoriented. When asked what his name was he replied " I don't have one".   O: BP 95/57 mmHg  Pulse 91  Temp(Src) 97.5 F (36.4 C) (Oral)  Resp 18  Ht  (1.803 m)  Wt 63.504 kg (140 lb)  BMI 19.53 kg/m2  SpO2 98%   General: Sleepy, in NAD, not oriented to person, place, or time CV: irregularly irregular. Normal s1 and s2. No murmurs Resp: distant breath sounds. Non- labored breathing  A/P: Anemia/Hemorrhagic shock; hemoglobin 6.1, lactic acid 4.8, still hypotensive. Will receive 1 unit of RBC now. Notified CCM, they will be by tomorrow. Recommended Hgb goal of 8 or above. Appreciate their assistance.    Beaulah Dinning, MD 08/01/2015, 12:06 AM PGY-1, [facility] Family Medicine Service pager [phone]

## 2015-08-01 NOTE — Progress Notes (Signed)
PCCM PROGRESS NOTE  ADMISSION DATE: 07/24/2015 CONSULT DATE: 07/26/2015  CC: Low blood pressure  SUBJECTIVE: Has cough.  Denies chest pain.  Pain in Rt hand better.  OBJECTIVE: BP 104/67 mmHg  Pulse 97  Temp(Src) 97.7 F (36.5 C) (Oral)  Resp 16  Ht  (1.803 m)  Wt 140 lb (63.504 kg)  BMI 19.53 kg/m2  SpO2 95%  I/O last 3 completed shifts: In: 8166.3 [P.O.:240; I.V.:4591.3; Blood:1335; IV Piggyback:2000] Out: 1027 [Urine:1025; Stool:2]   General: pleasant HEENT: no oral exudate Cardiac: irregular Chest: no wheeze Abd: soft, non tender Ext: Rt hand in wrap, Rt AKA, Lt BKA Neuro: follows commands   CMP Latest Ref Rng 08/01/2015 07/31/2015 07/30/2015  Glucose 65 - 99 mg/dL 782(N) 562(Z) 308(M)  BUN 6 - 20 mg/dL 57(Q) 46(N) 62(X)  Creatinine 0.61 - 1.24 mg/dL 5.28 4.13 2.44  Sodium 135 - 145 mmol/L 141 145 144  Potassium 3.5 - 5.1 mmol/L 3.5 4.2 3.2(L)  Chloride 101 - 111 mmol/L 113(H) 116(H) 113(H)  CO2 22 - 32 mmol/L 22 21(L) 25  Calcium 8.9 - 10.3 mg/dL 7.2(L) 7.5(L) 7.8(L)  Total Protein 6.5 - 8.1 g/dL - - 4.5(L)  Total Bilirubin 0.3 - 1.2 mg/dL - - 0.5  Alkaline Phos 38 - 126 U/L - - 47  AST 15 - 41 U/L - - 81(H)  ALT 17 - 63 U/L - - 85(H)    CBC Latest Ref Rng 08/01/2015 07/31/2015 07/31/2015  WBC 4.0 - 10.5 K/uL 21.1(H) 22.4(H) 24.9(H)  Hemoglobin 13.0 - 17.0 g/dL 7.3(L) 6.1(LL) 8.3(L)  Hematocrit 39.0 - 52.0 % 21.9(L) 18.2(L) 23.6(L)  Platelets 150 - 400 K/uL 139(L) 161 182    Ir Fluoro Guide Cv Line Left  07/31/2015   CLINICAL DATA:  69 year old male with a history of sepsis.  EXAM: PICC PLACEMENT WITH ULTRASOUND AND FLUOROSCOPY  FLUOROSCOPY TIME:  12 seconds  TECHNIQUE: After written informed consent was obtained, patient was placed in the supine position on angiographic table. Patency of the left brachial vein was confirmed with ultrasound with image documentation. An appropriate skin site was determined. Skin site was marked. Region was prepped using  maximum barrier technique including cap and mask, sterile gown, sterile gloves, large sterile sheet, and Chlorhexidine as cutaneous antisepsis. The region was infiltrated locally with 1% lidocaine. Under real-time ultrasound guidance, the left brachial vein was accessed with a 21 gauge micropuncture needle; the needle tip within the vein was confirmed with ultrasound image documentation. Needle exchanged over a 018 guidewire for a peel-away sheath, through which a 5-French double-lumen power injectable PICC trimmed to 47cm was advanced, positioned with its tip near the cavoatrial junction. Spot chest radiograph confirms appropriate catheter position. Catheter was flushed per protocol and secured externally with 0-Prolene sutures. The patient tolerated procedure well, with no immediate complication.  COMPLICATIONS: None  IMPRESSION: Status post placement of left upper extremity brachial vein double-lumen PICC, trimmed to 47 cm. Catheter ready for use.  Signed,  Yvone Neu. Loreta Ave, DO  [facility]  [facility]   Electronically Signed   By: Gilmer Mor D.O.   On: 07/31/2015 17:31   Ir US Guide Vasc Access Left  07/31/2015   CLINICAL DATA:  69 year old male with a history of sepsis.  EXAM: PICC PLACEMENT WITH ULTRASOUND AND FLUOROSCOPY  FLUOROSCOPY TIME:  12 seconds  TECHNIQUE: After written informed consent was obtained, patient was placed in the supine position on angiographic table. Patency of the left brachial vein was  confirmed with ultrasound with image documentation. An appropriate skin site was determined. Skin site was marked. Region was prepped using maximum barrier technique including cap and mask, sterile gown, sterile gloves, large sterile sheet, and Chlorhexidine as cutaneous antisepsis. The region was infiltrated locally with 1% lidocaine. Under real-time ultrasound guidance, the left brachial vein was accessed with a 21 gauge micropuncture needle; the  needle tip within the vein was confirmed with ultrasound image documentation. Needle exchanged over a 018 guidewire for a peel-away sheath, through which a 5-French double-lumen power injectable PICC trimmed to 47cm was advanced, positioned with its tip near the cavoatrial junction. Spot chest radiograph confirms appropriate catheter position. Catheter was flushed per protocol and secured externally with 0-Prolene sutures. The patient tolerated procedure well, with no immediate complication.  COMPLICATIONS: None  IMPRESSION: Status post placement of left upper extremity brachial vein double-lumen PICC, trimmed to 47 cm. Catheter ready for use.  Signed,  Yvone Neu. Loreta Ave, DO  [facility]  [facility]   Electronically Signed   By: Gilmer Mor D.O.   On: 07/31/2015 17:31   US Abdomen Limited Ruq  07/30/2015   CLINICAL DATA:  GI bleed with melena. Elevated INR. Initial encounter.  EXAM: US ABDOMEN LIMITED - RIGHT UPPER QUADRANT  COMPARISON:  CT 08/15/2014.  FINDINGS: Gallbladder:  Several small gallstones are noted as seen on prior CT. There is mild gallbladder wall thickening but no sonographic Murphy's sign.  Common bile duct:  Diameter: 3.1 mm  Liver:  The liver is heterogeneous in echotexture with mild contour irregularity, suspicious for cirrhosis. Mild perihepatic ascites is noted. A small right pleural effusion is also noted.  IMPRESSION: 1. Cholelithiasis with nonspecific mild gallbladder wall thickening, possibly related to adjacent liver disease. Negative sonographic Murphy's sign. 2. Heterogeneity of the hepatic echotexture with contour irregularity suspicious for cirrhosis. Mild ascites.   Electronically Signed   By: Carey Bullocks M.D.   On: 07/30/2015 18:45   EVENTS: 9/27 Admit, VVS consulted 929 Ortho consulted; bradycardia, hypotension 9/30 Rt ring finger amputation 10/1 GI consult for heme positive stools 10/3 EGD >> non bleeding gastric ulcer,  bleeding duodenal bulb 10/4 Hypotension >> PCCM asked to re-assess  LINES: Lt PICC 10/4 >>  DISCUSSION: 69 yo male smoker with failure to thrive.  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  She has hx of severe PAD s/p Rt AKA, Lt BKA.  She also has hx of A fib on coumadin as outpt, HTN, DM 2, CAD, CVA, HLD.[name]  ASSESSMENT/PLAN:  Sepsis 2nd to grangrene of Rt ring finger >> completed Abx 10/2. Plan: - post-op care per ortho - continue IV fluids  Upper GI bleed 2nd to PUD. Plan: - protonix per GI - transfuse for Hb < 7 or bleeding  Hypotension >> most recent episode likely from GI bleed, and relative adrenal insufficiency. Plan: - optimize hemodynamics >> will give fluid bolus 10/5 - continue solu cortef per primary team  PCCM will sign off.  Please call if additional help needed.  Coralyn Helling, MD [facility] Pulmonary/Critical Care 08/01/2015, 10:59 AM Pager:  [phone] After 3pm call: [phone]
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
## 2015-08-01 NOTE — Consult Note (Signed)
Consultation Note Date: 08/01/2015   Patient Name: [name]  DOB: Aug 11, 1946  MRN: [id number]  Age / Sex: 69 y.o., male   PCP: Adrian Prince, MD Referring Physician: Nestor Ramp, MD  Reason for Consultation: Establishing goals of care  Palliative Care Assessment and Plan Summary of Established Goals of Care and Medical Treatment Preferences  [name] is a 69 y.o. 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 The patient has been admitted with a GI Bleed. 10-3 EGD showed non bleeding gastric ulcers. Hospital course also significant for acute blood loss anemia, s/p PRBC, patient also received FFP and vitamin K for supra therapeutic INR. He has also received solucortef for adrenal insufficiency.   The patient has had decreased appetite for the past 3-4 weeks. The patient lives at home with son in [place]. The patient's wife is deceased, the patient has one another son who lives in [place], [place].   A palliative consult has been placed for further discussions regarding the patient's code status and goals of care. The patient is resting in bed, he appears pale, he is oriented to his name, he recognizes his son and sister in the room. The patient appears anxious and agreed for the discussion to take place without him outside the room.   The patient has had gradual progressive decline at home. Discussed DNR DNI, discussed addition of hospice services with the patient's son and sister as they stated that they are leaning towards making sure he is kept as comfortable as possible. All questions answered. Son and sister provided with information on how to reach the palliative provider and service.   Family would like to discuss amongst themselves this evening. I will follow up with them in am.     Contacts/Participants in Discussion: Primary  Decision Maker:   Son [name] HCPOA: yes     Code Status/Advance Care Planning:   full code for now, detailed discussions undertaken regarding DNR DNI  Symptom Management:  Continue current treatment.   Palliative Prophylaxis: yes  Additional Recommendations (Limitations, Scope, Preferences):  Follow up in am.  Psycho-social/Spiritual:   Support System: yes, son and sister   Desire for further Chaplaincy support:no  Prognosis: days to some very limited number of weeks  Discharge Planning:  pending clinical course  Values:  Transitioning towards comfort pending further discussions to be undertaken amongst family members.  Life limiting illness: GI bleed, recent sepsis, history of CVA HTN DM      Chief Complaint/History of Present Illness: patient initially was admitted for finger turning black.   Primary Diagnoses  Present on Admission:  . FTT (failure to thrive) in adult . Hypotension . Sepsis (HCC)  Palliative Review of Systems: Completed  I have reviewed the medical record, interviewed the patient and family, and examined the patient. The following aspects are pertinent.  Past Medical History  Diagnosis Date  . Anemia   . CAD (coronary artery disease)   . History of GI bleed   . PAD (peripheral artery disease) (HCC)   . Atrial fibrillation (HCC)   . Hypothyroidism   . Hyperlipidemia   . Hypertension   . Chronic kidney disease     chronic renal insufficiency  . COPD (chronic obstructive pulmonary disease) (HCC)   . Dehiscence of operative wound   . H/O hiatal  hernia   . Type II diabetes mellitus (HCC)   . History of blood transfusion     "some; related to surgeries & LGIB"  . GERD (gastroesophageal reflux disease)   . Depression   . Stroke [facility])     "he's had 6 mini strokes/last CT scan, probably more since; stroke in right eye, can't see out of it since" (08/15/2014)   Social History   Social History  . Marital Status: Widowed    Spouse  Name: N/A  . Number of Children: N/A  . Years of Education: N/A   Social History Main Topics  . Smoking status: Current Every Day Smoker -- 1.00 packs/day for 58 years    Types: Cigarettes  . Smokeless tobacco: Never Used  . Alcohol Use: Yes     Comment: 08/15/2014 "I aien't drank nothing in years"  . Drug Use: No  . Sexual Activity: No   Other Topics Concern  . None   Social History Narrative   History reviewed. No pertinent family history. Scheduled Meds: . digoxin  125 mcg Oral Daily  . feeding supplement (GLUCERNA SHAKE)  237 mL Oral TID BM  . hydrocortisone sod succinate (SOLU-CORTEF) inj  50 mg Intravenous Q6H  . insulin aspart  0-15 Units Subcutaneous TID WC  . insulin aspart  0-5 Units Subcutaneous QHS  . levothyroxine  50 mcg Oral QAC breakfast  . silver sulfADIAZINE  1 application Topical Daily  . sodium chloride  3 mL Intravenous Q12H   Continuous Infusions: . lactated ringers 125 mL/hr (08/01/15 1024)   PRN Meds:.acetaminophen Medications Prior to Admission:  Prior to Admission medications   Medication Sig Start Date End Date Taking? Authorizing Provider  acetaminophen (TYLENOL) 325 MG tablet Take 650 mg by mouth every 6 (six) hours as needed (pain).   Yes Historical Provider, MD  atorvastatin (LIPITOR) 80 MG tablet Take 80 mg by mouth daily.   Yes Historical Provider, MD  benazepril-hydrochlorthiazide (LOTENSIN HCT) 10-12.5 MG tablet Take 1 tablet by mouth daily. 05/30/15  Yes Historical Provider, MD  diazepam (VALIUM) 10 MG tablet Take 5 mg by mouth daily as needed for anxiety.   Yes Historical Provider, MD  digoxin (LANOXIN) 0.125 MG tablet Take 125 mcg by mouth daily.     Yes Historical Provider, MD  diltiazem (CARDIZEM CD) 180 MG 24 hr capsule Take 1 capsule (180 mg total) by mouth daily. 02/24/13  Yes Adrian Prince, MD  Docusate Calcium (STOOL SOFTENER PO) Take 1 tablet by mouth daily. 05/30/15  Yes Historical Provider, MD  fenofibrate 160 MG tablet Take 160 mg  by mouth daily.     Yes Historical Provider, MD  furosemide (LASIX) 40 MG tablet Take 40 mg by mouth daily. 05/08/15  Yes Historical Provider, MD  glimepiride (AMARYL) 4 MG tablet Take 2 mg by mouth daily. 05/23/15  Yes Historical Provider, MD  insulin regular human CONCENTRATED (HUMULIN R) 500 UNIT/ML SOLN injection Inject 6 Units into the skin daily.    Yes Historical Provider, MD  levothyroxine (SYNTHROID, LEVOTHROID) 50 MCG tablet Take 50 mcg by mouth daily.     Yes Historical Provider, MD  metoprolol (LOPRESSOR) 50 MG tablet Take 25 mg by mouth 2 (two) times daily.    Yes Historical Provider, MD  Multiple Vitamin (MULTIVITAMIN) tablet Take 1 tablet by mouth daily.     Yes Historical Provider, MD  silver sulfADIAZINE (SILVADENE) 1 % cream Apply 1 application topically daily. 08/18/14  Yes Adrian Prince, MD  warfarin (COUMADIN)  3 MG tablet Take 1.5-3 mg by mouth daily. Take 3 mg by mouth on Monday and Friday. Take 1.5 mg by mouth on all other days.   Yes Historical Provider, MD   No Known Allergies CBC:    Component Value Date/Time   WBC 23.2* 08/01/2015 1135   HGB 8.8* 08/01/2015 1135   HCT 25.6* 08/01/2015 1135   PLT 142* 08/01/2015 1135   MCV 85.9 08/01/2015 1135   NEUTROABS 22.5* 07/27/2015 0616   LYMPHSABS 1.3 07/27/2015 0616   MONOABS 2.1* 07/27/2015 0616   EOSABS 0.0 07/27/2015 0616   BASOSABS 0.0 07/27/2015 0616   Comprehensive Metabolic Panel:    Component Value Date/Time   NA 141 08/01/2015 0431   K 3.5 08/01/2015 0431   CL 113* 08/01/2015 0431   CO2 22 08/01/2015 0431   BUN 36* 08/01/2015 0431   CREATININE 0.90 08/01/2015 0431   GLUCOSE 170* 08/01/2015 0431   CALCIUM 7.2* 08/01/2015 0431   AST 81* 07/30/2015 1209   ALT 85* 07/30/2015 1209   ALKPHOS 47 07/30/2015 1209   BILITOT 0.5 07/30/2015 1209   PROT 4.5* 07/30/2015 1209   ALBUMIN 1.6* 07/30/2015 1209    Physical Exam: Vital Signs: BP 114/35 mmHg  Pulse 89  Temp(Src) 97.3 F (36.3 C) (Oral)  Resp 19   Ht 5\' 11"  (1.803 m)  Wt 63.504 kg (140 lb)  BMI 19.53 kg/m2  SpO2 95% SpO2: SpO2: 95 % O2 Device: O2 Device: Not Delivered O2 Flow Rate: O2 Flow Rate (L/min): 3 L/min Intake/output summary:  Intake/Output Summary (Last 24 hours) at 08/01/15 1617 Last data filed at 08/01/15 1500  Gross per 24 hour  Intake 5462.5 ml  Output    376 ml  Net 5086.5 ml   LBM: Last BM Date: 08/01/15 Baseline Weight: Weight: 58.968 kg (130 lb) Most recent weight: Weight: 63.504 kg (140 lb)  Exam Findings:  Elderly gentleman laying in bed Clear S1S2 Abdomen soft Bilateral BKA                        Palliative Performance Scale: 30%  Additional Data Reviewed: Recent Labs     07/31/15  0403   08/01/15  0431  08/01/15  1135  WBC  24.9*   < >  21.1*  23.2*  HGB  8.3*   < >  7.3*  8.8*  PLT  182   < >  139*  142*  NA  145   --   141   --   BUN  55*   --   36*   --   CREATININE  1.05   --   0.90   --    < > = values in this interval not displayed.     Time In: 1500 Time Out: 1600 Time Total: 60 min  Greater than 50%  of this time was spent counseling and coordinating care related to the above assessment and plan.  Signed by: Rosalin Hawking, MD [id number] Rosalin Hawking, MD  08/01/2015, 4:17 PM  Please contact Palliative Medicine Team phone at [phone] for questions and concerns.

## 2015-08-01 NOTE — Progress Notes (Signed)
Subjective:  Called to see patient as he was noted to have further minimally elevated troponin I in the setting of upper GI bleeding/acute anemia and hypotensive shock. Hypotensive shock resolved hemoglobin has been stable. No further obvious bleeding. Patient presently denies any chest pain or shortness of breath. Remains in atrial fibrillation with controlled ventricular response   Objective:  Vital Signs in the last 24 hours: Temp:  [96.1 F (35.6 C)-97.8 F (36.6 C)] 97.3 F (36.3 C) (10/05 1200) Pulse Rate:  [89-99] 89 (10/05 1200) Resp:  [12-20] 19 (10/05 1200) BP: (74-114)/(35-82) 114/35 mmHg (10/05 1200) SpO2:  [95 %-100 %] 95 % (10/05 0400)  Intake/Output from previous day: 10/04 0701 - 10/05 0700 In: 3086.5 [P.O.:240; I.V.:3091.3; Blood:1335; IV Piggyback:2000] Out: 376 [Urine:375; Stool:1] Intake/Output from this shift: Total I/O In: 2940 [P.O.:1440; I.V.:1500] Out: -   Physical Exam: Neck: no adenopathy, no carotid bruit, no JVD and supple, symmetrical, trachea midline Lungs: clear to auscultation bilaterally Heart: irregularly irregular rhythm, S1, S2 normal and 2/6 systolic murmur noted Abdomen: soft, non-tender; bowel sounds normal; no masses,  no organomegaly Extremities: Right AKA and left BKA noted, surgical dressing right hand. multiple ecchymosis upper extremities    Lab Results:  Recent Labs  08/01/15 0431 08/01/15 1135  WBC 21.1* 23.2*  HGB 7.3* 8.8*  PLT 139* 142*    Recent Labs  07/31/15 0403 08/01/15 0431  NA 145 141  K 4.2 3.5  CL 116* 113*  CO2 21* 22  GLUCOSE 241* 170*  BUN 55* 36*  CREATININE 1.05 0.90    Recent Labs  08/01/15 0430 08/01/15 1134  TROPONINI 0.39* 0.54*   Hepatic Function Panel  Recent Labs  07/30/15 1209  PROT 4.5*  ALBUMIN 1.6*  AST 81*  ALT 85*  ALKPHOS 47  BILITOT 0.5  BILIDIR 0.2  IBILI 0.3   No results for input(s): CHOL in the last 72 hours. No results for input(s): PROTIME in the last 72  hours.  Imaging: Imaging results have been reviewed and Ir Fluoro Guide Cv Line Left  07/31/2015   CLINICAL DATA:  69 year old male with a history of sepsis.  EXAM: PICC PLACEMENT WITH ULTRASOUND AND FLUOROSCOPY  FLUOROSCOPY TIME:  12 seconds  TECHNIQUE: After written informed consent was obtained, patient was placed in the supine position on angiographic table. Patency of the left brachial vein was confirmed with ultrasound with image documentation. An appropriate skin site was determined. Skin site was marked. Region was prepped using maximum barrier technique including cap and mask, sterile gown, sterile gloves, large sterile sheet, and Chlorhexidine as cutaneous antisepsis. The region was infiltrated locally with 1% lidocaine. Under real-time ultrasound guidance, the left brachial vein was accessed with a 21 gauge micropuncture needle; the needle tip within the vein was confirmed with ultrasound image documentation. Needle exchanged over a 018 guidewire for a peel-away sheath, through which a 5-French double-lumen power injectable PICC trimmed to 47cm was advanced, positioned with its tip near the cavoatrial junction. Spot chest radiograph confirms appropriate catheter position. Catheter was flushed per protocol and secured externally with 0-Prolene sutures. The patient tolerated procedure well, with no immediate complication.  COMPLICATIONS: None  IMPRESSION: Status post placement of left upper extremity brachial vein double-lumen PICC, trimmed to 47 cm. Catheter ready for use.  Signed,  Yvone Neu. Loreta Ave, DO  [facility]  [facility]   Electronically Signed   By: Gilmer Mor D.O.   On: 07/31/2015 17:31   Ir US Guide  Vasc Access Left  07/31/2015   CLINICAL DATA:  68 year old male with a history of sepsis.  EXAM: PICC PLACEMENT WITH ULTRASOUND AND FLUOROSCOPY  FLUOROSCOPY TIME:  12 seconds  TECHNIQUE: After written informed consent was obtained, patient was  placed in the supine position on angiographic table. Patency of the left brachial vein was confirmed with ultrasound with image documentation. An appropriate skin site was determined. Skin site was marked. Region was prepped using maximum barrier technique including cap and mask, sterile gown, sterile gloves, large sterile sheet, and Chlorhexidine as cutaneous antisepsis. The region was infiltrated locally with 1% lidocaine. Under real-time ultrasound guidance, the left brachial vein was accessed with a 21 gauge micropuncture needle; the needle tip within the vein was confirmed with ultrasound image documentation. Needle exchanged over a 018 guidewire for a peel-away sheath, through which a 5-French double-lumen power injectable PICC trimmed to 47cm was advanced, positioned with its tip near the cavoatrial junction. Spot chest radiograph confirms appropriate catheter position. Catheter was flushed per protocol and secured externally with 0-Prolene sutures. The patient tolerated procedure well, with no immediate complication.  COMPLICATIONS: None  IMPRESSION: Status post placement of left upper extremity brachial vein double-lumen PICC, trimmed to 47 cm. Catheter ready for use.  Signed,  Yvone Neu. Loreta Ave, DO  [facility]  [facility]   Electronically Signed   By: Gilmer Mor D.O.   On: 07/31/2015 17:31   US Abdomen Limited Ruq  07/30/2015   CLINICAL DATA:  GI bleed with melena. Elevated INR. Initial encounter.  EXAM: US ABDOMEN LIMITED - RIGHT UPPER QUADRANT  COMPARISON:  CT 08/15/2014.  FINDINGS: Gallbladder:  Several small gallstones are noted as seen on prior CT. There is mild gallbladder wall thickening but no sonographic Murphy's sign.  Common bile duct:  Diameter: 3.1 mm  Liver:  The liver is heterogeneous in echotexture with mild contour irregularity, suspicious for cirrhosis. Mild perihepatic ascites is noted. A small right pleural effusion is also noted.   IMPRESSION: 1. Cholelithiasis with nonspecific mild gallbladder wall thickening, possibly related to adjacent liver disease. Negative sonographic Murphy's sign. 2. Heterogeneity of the hepatic echotexture with contour irregularity suspicious for cirrhosis. Mild ascites.   Electronically Signed   By: Carey Bullocks M.D.   On: 07/30/2015 18:45    Cardiac Studies:  Assessment/Plan:  Probable very small non-Q-wave myocardial infarction secondary to demand ischemia secondary to hypotensive shock/acute blood loss anemia secondary to GI bleed. Peptic ulcer disease Coronary artery disease history of MI in the past Moderate aortic stenosis Chronic atrial fibrillation with chadsvasc score of 7 Status post Coumadin toxicity Hypertension Diabetes mellitus Hyperlipidemia Peripheral vascular disease status post right AKA and left BKA Hypothyroidism COPD GERD Chronic kidney disease stage II stable Malnutrition Marked leukocytosis  Plan Patient not a candidate for any cardiac interventions in view of acute GI bleed and multiple comorbidities and sedentary lifestyle We will add statin as per orders Consider starting low-dose beta blockers if heart rate and blood pressure tolerates. Patient is high risk for cardioembolic CVA but not a candidate for acute anticoagulation for now.  LOS: 8 days    Rinaldo Cloud 08/01/2015, 5:17 PM

## 2015-08-01 NOTE — Progress Notes (Signed)
CRITICAL VALUE ALERT  Critical value received:  Lactic acid 4.6 and troponin .55  Date of notification: 08/01/2015  Time of notification:  2334  Critical value read back:yes  Nurse who received alert:  Jacklyn Shell RN  MD notified (1st page):  Dr. Ancil Boozer  Time of first page:  2336  MD notified (2nd page):  Time of second page:  Responding MD:  Tora Duck  Time MD responded:  2338

## 2015-08-01 NOTE — Consult Note (Signed)
   [facility] CM Inpatient Consult   08/01/2015  Jacson Rapaport [facility] [birth year] [id number] Follow up on referral for community care management.  Spoke with the inpatient RNCM regarding patient and needs for community care management.  She states that the patient and family has a palliative care appointment today and will know more about the patient's disposition and needs after that particular meeting.  Will follow up for needs.  For questions, please contact: Charlesetta Shanks, RN BSN CCM [facility]  [phone] business mobile phone

## 2015-08-01 NOTE — Progress Notes (Signed)
[facility] PROGRESS NOTE Subjective Pt still passing maroonish stool, received blood.  Objective: Vital signs in last 24 hours: Temp:  [96.1 F (35.6 C)-97.8 F (36.6 C)] 97.7 F (36.5 C) (10/05 7829) Pulse Rate:  [82-99] 99 (10/05 0613) Resp:  [12-20] 20 (10/05 5621) BP: (72-104)/(26-82) 97/82 mmHg (10/05 0613) SpO2:  [95 %-100 %] 95 % (10/05 0400) Last BM Date: 07/31/15  Intake/Output from previous day: 10/04 0701 - 10/05 0700 In: 3086.5 [P.O.:240; I.V.:3091.3; Blood:1335; IV Piggyback:2000] Out: 376 [Urine:375; Stool:1] Intake/Output this shift:    PE: General--alert no complaints  Abdomen--soft and nontender  Lab Results:  Recent Labs  07/30/15 0552 07/30/15 1658 07/31/15 0403 07/31/15 2249 08/01/15 0431  WBC 24.7* 24.2* 24.9* 22.4* 21.1*  HGB 10.4*  10.5* 9.8* 8.3* 6.1* 7.3*  HCT 30.1*  30.3* 28.9* 23.6* 18.2* 21.9*  PLT 162 189 182 161 139*   BMET  Recent Labs  07/30/15 0552 07/31/15 0403 08/01/15 0431  NA 144 145 141  K 3.2* 4.2 3.5  CL 113* 116* 113*  CO2 25 21* 22  CREATININE 0.93 1.05 0.90   LFT  Recent Labs  07/30/15 1209  PROT 4.5*  AST 81*  ALT 85*  ALKPHOS 47  BILITOT 0.5  BILIDIR 0.2  IBILI 0.3   PT/INR  Recent Labs  07/30/15 0552 07/31/15 2249 08/01/15 0431  LABPROT 27.8* 22.0* 18.8*  INR 2.64* 1.94* 1.57*   PANCREAS No results for input(s): LIPASE in the last 72 hours.       Studies/Results: Ir Fluoro Guide Cv Line Left  07/31/2015   CLINICAL DATA:  69 year old male with a history of sepsis.  EXAM: PICC PLACEMENT WITH ULTRASOUND AND FLUOROSCOPY  FLUOROSCOPY TIME:  12 seconds  TECHNIQUE: After written informed consent was obtained, patient was placed in the supine position on angiographic table. Patency of the left brachial vein was confirmed with ultrasound with image documentation. An appropriate skin site was determined. Skin site was marked. Region was prepped using maximum barrier technique  including cap and mask, sterile gown, sterile gloves, large sterile sheet, and Chlorhexidine as cutaneous antisepsis. The region was infiltrated locally with 1% lidocaine. Under real-time ultrasound guidance, the left brachial vein was accessed with a 21 gauge micropuncture needle; the needle tip within the vein was confirmed with ultrasound image documentation. Needle exchanged over a 018 guidewire for a peel-away sheath, through which a 5-French double-lumen power injectable PICC trimmed to 47cm was advanced, positioned with its tip near the cavoatrial junction. Spot chest radiograph confirms appropriate catheter position. Catheter was flushed per protocol and secured externally with 0-Prolene sutures. The patient tolerated procedure well, with no immediate complication.  COMPLICATIONS: None  IMPRESSION: Status post placement of left upper extremity brachial vein double-lumen PICC, trimmed to 47 cm. Catheter ready for use.  Signed,  Yvone Neu. Loreta Ave, DO  [facility]  [facility]   Electronically Signed   By: Gilmer Mor D.O.   On: 07/31/2015 17:31   Ir US Guide Vasc Access Left  07/31/2015   CLINICAL DATA:  69 year old male with a history of sepsis.  EXAM: PICC PLACEMENT WITH ULTRASOUND AND FLUOROSCOPY  FLUOROSCOPY TIME:  12 seconds  TECHNIQUE: After written informed consent was obtained, patient was placed in the supine position on angiographic table. Patency of the left brachial vein was confirmed with ultrasound with image documentation. An appropriate skin site was determined. Skin site was marked. Region was prepped using maximum barrier technique including cap and mask, sterile  gown, sterile gloves, large sterile sheet, and Chlorhexidine as cutaneous antisepsis. The region was infiltrated locally with 1% lidocaine. Under real-time ultrasound guidance, the left brachial vein was accessed with a 21 gauge micropuncture needle; the needle tip within the vein was  confirmed with ultrasound image documentation. Needle exchanged over a 018 guidewire for a peel-away sheath, through which a 5-French double-lumen power injectable PICC trimmed to 47cm was advanced, positioned with its tip near the cavoatrial junction. Spot chest radiograph confirms appropriate catheter position. Catheter was flushed per protocol and secured externally with 0-Prolene sutures. The patient tolerated procedure well, with no immediate complication.  COMPLICATIONS: None  IMPRESSION: Status post placement of left upper extremity brachial vein double-lumen PICC, trimmed to 47 cm. Catheter ready for use.  Signed,  Yvone Neu. Loreta Ave, DO  [facility]  [facility]   Electronically Signed   By: Gilmer Mor D.O.   On: 07/31/2015 17:31   US Abdomen Limited Ruq  07/30/2015   CLINICAL DATA:  GI bleed with melena. Elevated INR. Initial encounter.  EXAM: US ABDOMEN LIMITED - RIGHT UPPER QUADRANT  COMPARISON:  CT 08/15/2014.  FINDINGS: Gallbladder:  Several small gallstones are noted as seen on prior CT. There is mild gallbladder wall thickening but no sonographic Murphy's sign.  Common bile duct:  Diameter: 3.1 mm  Liver:  The liver is heterogeneous in echotexture with mild contour irregularity, suspicious for cirrhosis. Mild perihepatic ascites is noted. A small right pleural effusion is also noted.  IMPRESSION: 1. Cholelithiasis with nonspecific mild gallbladder wall thickening, possibly related to adjacent liver disease. Negative sonographic Murphy's sign. 2. Heterogeneity of the hepatic echotexture with contour irregularity suspicious for cirrhosis. Mild ascites.   Electronically Signed   By: Carey Bullocks M.D.   On: 07/30/2015 18:45    Medications: I have reviewed the patient's current medications.  Assessment/Plan: 1. GI Bleed. Has GU on EGD and ? Bleeding in duod as well on PPI, and hopefully slowing down as INR corrects. ? How aggressive to be ? Colon  bleeding scan support only etc.   Sabrine Patchen JR,Onica Davidovich L 08/01/2015, 8:36 AM  Pager: [phone] If no answer or after hours call [phone]

## 2015-08-02 DIAGNOSIS — Z515 Encounter for palliative care: Secondary | ICD-10-CM

## 2015-08-02 LAB — GLUCOSE, CAPILLARY
GLUCOSE-CAPILLARY: 200 mg/dL — AB (ref 65–99)
GLUCOSE-CAPILLARY: 212 mg/dL — AB (ref 65–99)
GLUCOSE-CAPILLARY: 178 mg/dL — AB (ref 65–99)
Glucose-Capillary: 175 mg/dL — ABNORMAL HIGH (ref 65–99)

## 2015-08-02 LAB — CBC
HCT: 22.1 % — ABNORMAL LOW (ref 39.0–52.0)
HEMATOCRIT: 23.6 % — AB (ref 39.0–52.0)
HEMOGLOBIN: 7.6 g/dL — AB (ref 13.0–17.0)
Hemoglobin: 8.3 g/dL — ABNORMAL LOW (ref 13.0–17.0)
MCH: 30.4 pg (ref 26.0–34.0)
MCH: 30.2 pg (ref 26.0–34.0)
MCHC: 35.2 g/dL (ref 30.0–36.0)
MCHC: 34.4 g/dL (ref 30.0–36.0)
MCV: 86.4 fL (ref 78.0–100.0)
MCV: 87.7 fL (ref 78.0–100.0)
Platelets: 164 10*3/uL (ref 150–400)
Platelets: 159 10*3/uL (ref 150–400)
RBC: 2.73 MIL/uL — ABNORMAL LOW (ref 4.22–5.81)
RBC: 2.52 MIL/uL — ABNORMAL LOW (ref 4.22–5.81)
RDW: 17.3 % — AB (ref 11.5–15.5)
RDW: 17.4 % — AB (ref 11.5–15.5)
WBC: 22 10*3/uL — AB (ref 4.0–10.5)
WBC: 18.8 10*3/uL — ABNORMAL HIGH (ref 4.0–10.5)

## 2015-08-02 LAB — BASIC METABOLIC PANEL
Anion gap: 3 — ABNORMAL LOW (ref 5–15)
BUN: 32 mg/dL — AB (ref 6–20)
CALCIUM: 7.2 mg/dL — AB (ref 8.9–10.3)
CHLORIDE: 113 mmol/L — AB (ref 101–111)
CO2: 23 mmol/L (ref 22–32)
CREATININE: 0.95 mg/dL (ref 0.61–1.24)
Glucose, Bld: 212 mg/dL — ABNORMAL HIGH (ref 65–99)
Potassium: 3.8 mmol/L (ref 3.5–5.1)
SODIUM: 139 mmol/L (ref 135–145)

## 2015-08-02 LAB — TYPE AND SCREEN
ABO/RH(D): A POS
Antibody Screen: NEGATIVE
UNIT DIVISION: 0
UNIT DIVISION: 0
Unit division: 0

## 2015-08-02 LAB — PROCALCITONIN: Procalcitonin: <0.1 ng/mL

## 2015-08-02 LAB — PROTIME-INR
INR: 1.32 (ref 0.00–1.49)
Prothrombin Time: 16.5 seconds — ABNORMAL HIGH (ref 11.6–15.2)

## 2015-08-02 LAB — TROPONIN I
TROPONIN I: 0.59 ng/mL — AB (ref <0.031)
TROPONIN I: 0.53 ng/mL — AB (ref <0.031)
Troponin I: 0.63 ng/mL (ref <0.031)

## 2015-08-02 LAB — LACTIC ACID, PLASMA: LACTIC ACID, VENOUS: 3 mmol/L — AB (ref 0.5–2.0)

## 2015-08-02 MED ORDER — PANTOPRAZOLE SODIUM 40 MG PO TBEC
40.0000 mg | DELAYED_RELEASE_TABLET | Freq: Two times a day (BID) | ORAL | Status: DC
  Administered 2015-08-02 – 2015-08-07 (×11): 40 mg via ORAL
  Filled 2015-08-02 (×11): qty 1

## 2015-08-02 MED ORDER — SODIUM CHLORIDE 0.9 % IV BOLUS (SEPSIS)
500.0000 mL | Freq: Once | INTRAVENOUS | Status: AC
  Administered 2015-08-02: 500 mL via INTRAVENOUS

## 2015-08-02 MED ORDER — METOPROLOL TARTRATE 12.5 MG HALF TABLET
12.5000 mg | ORAL_TABLET | Freq: Two times a day (BID) | ORAL | Status: DC
  Administered 2015-08-02: 12.5 mg via ORAL
  Filled 2015-08-02 (×2): qty 1

## 2015-08-02 NOTE — Progress Notes (Signed)
Subjective:  Patient denies any chest pain or shortness of breath.  Troponin remains minimally elevated.  Discussed with patient's family and patient regarding possible very small myocardial infarction and agrees for medical management only  Objective:  Vital Signs in the last 24 hours: Temp:  [97.4 F (36.3 C)-97.7 F (36.5 C)] 97.6 F (36.4 C) (10/06 1201) Pulse Rate:  [86-100] 88 (10/06 1201) Resp:  [12-19] 19 (10/06 1201) BP: (92-115)/(32-80) 114/51 mmHg (10/06 0742) SpO2:  [97 %-100 %] 100 % (10/06 1201) Weight:  [78.019 kg (172 lb)] 78.019 kg (172 lb) (10/06 0444)  Intake/Output from previous day: 10/05 0701 - 10/06 0700 In: 6073.6 [P.O.:2760; I.V.:3313.6] Out: 1250 [Urine:1250] Intake/Output from this shift: Total I/O In: 690 [P.O.:660; I.V.:30] Out: 300 [Urine:300]  Physical Exam: exam unchanged  Lab Results:  Recent Labs  08/01/15 2220 08/02/15 0520  WBC 21.7* 22.0*  HGB 7.9* 8.3*  PLT 145* 164    Recent Labs  07/31/15 0403 08/01/15 0431  NA 145 141  K 4.2 3.5  CL 116* 113*  CO2 21* 22  GLUCOSE 241* 170*  BUN 55* 36*  CREATININE 1.05 0.90    Recent Labs  08/01/15 2220 08/02/15 0520  TROPONINI 0.55* 0.63*   Hepatic Function Panel  Recent Labs  08/01/15 1720  PROT 3.1*  ALBUMIN 1.6*  AST 55*  ALT 57  ALKPHOS 37*  BILITOT 1.1  BILIDIR 0.4  IBILI 0.7   No results for input(s): CHOL in the last 72 hours. No results for input(s): PROTIME in the last 72 hours.  Imaging: Imaging results have been reviewed and Ir Fluoro Guide Cv Line Left  07/31/2015   CLINICAL DATA:  69 year old male with a history of sepsis.  EXAM: PICC PLACEMENT WITH ULTRASOUND AND FLUOROSCOPY  FLUOROSCOPY TIME:  12 seconds  TECHNIQUE: After written informed consent was obtained, patient was placed in the supine position on angiographic table. Patency of the left brachial vein was confirmed with ultrasound with image documentation. An appropriate skin site was determined.  Skin site was marked. Region was prepped using maximum barrier technique including cap and mask, sterile gown, sterile gloves, large sterile sheet, and Chlorhexidine as cutaneous antisepsis. The region was infiltrated locally with 1% lidocaine. Under real-time ultrasound guidance, the left brachial vein was accessed with a 21 gauge micropuncture needle; the needle tip within the vein was confirmed with ultrasound image documentation. Needle exchanged over a 018 guidewire for a peel-away sheath, through which a 5-French double-lumen power injectable PICC trimmed to 47cm was advanced, positioned with its tip near the cavoatrial junction. Spot chest radiograph confirms appropriate catheter position. Catheter was flushed per protocol and secured externally with 0-Prolene sutures. The patient tolerated procedure well, with no immediate complication.  COMPLICATIONS: None  IMPRESSION: Status post placement of left upper extremity brachial vein double-lumen PICC, trimmed to 47 cm. Catheter ready for use.  Signed,  Yvone Neu. Loreta Ave, DO  [facility]  [facility] Radiology   Electronically Signed   By: Gilmer Mor D.O.   On: 07/31/2015 17:31   Ir US Guide Vasc Access Left  07/31/2015   CLINICAL DATA:  69 year old male with a history of sepsis.  EXAM: PICC PLACEMENT WITH ULTRASOUND AND FLUOROSCOPY  FLUOROSCOPY TIME:  12 seconds  TECHNIQUE: After written informed consent was obtained, patient was placed in the supine position on angiographic table. Patency of the left brachial vein was confirmed with ultrasound with image documentation. An appropriate skin site was determined. Skin site was  marked. Region was prepped using maximum barrier technique including cap and mask, sterile gown, sterile gloves, large sterile sheet, and Chlorhexidine as cutaneous antisepsis. The region was infiltrated locally with 1% lidocaine. Under real-time ultrasound guidance, the left brachial vein was accessed  with a 21 gauge micropuncture needle; the needle tip within the vein was confirmed with ultrasound image documentation. Needle exchanged over a 018 guidewire for a peel-away sheath, through which a 5-French double-lumen power injectable PICC trimmed to 47cm was advanced, positioned with its tip near the cavoatrial junction. Spot chest radiograph confirms appropriate catheter position. Catheter was flushed per protocol and secured externally with 0-Prolene sutures. The patient tolerated procedure well, with no immediate complication.  COMPLICATIONS: None  IMPRESSION: Status post placement of left upper extremity brachial vein double-lumen PICC, trimmed to 47 cm. Catheter ready for use.  Signed,  Yvone Neu. Loreta Ave, DO  [facility]  [facility] Radiology   Electronically Signed   By: Gilmer Mor D.O.   On: 07/31/2015 17:31    Cardiac Studies:  Assessment/Plan:  Probable very small non-Q-wave myocardial infarction secondary to demand ischemia secondary to hypotensive shock/acute blood loss anemia secondary to GI bleed. Peptic ulcer disease Coronary artery disease history of MI in the past Moderate aortic stenosis Chronic atrial fibrillation with chadsvasc score of 7 Status post Coumadin toxicity Hypertension Diabetes mellitus Hyperlipidemia Peripheral vascular disease status post right AKA and left BKA Hypothyroidism COPD GERD Chronic kidney disease stage II stable Malnutrition Marked leukocytosis Plan Start Lopressor 12.5 mg twice daily, hold if blood pressure below 100 systolic or heart rate below 65  LOS: 9 days    Rinaldo Cloud 08/02/2015, 12:33 PM

## 2015-08-02 NOTE — Progress Notes (Signed)
Pt PICC line is no longer in place. IV team notified and Dr. Kennon Rounds made aware. Pt is skin is weeping a lot  and this may have caused the PICC line to slide out.

## 2015-08-02 NOTE — Progress Notes (Signed)
[facility] PROGRESS NOTE Subjective No gross bleeding, seen by Palliative care. On clears  Objective: Vital signs in last 24 hours: Temp:  [97.4 F (36.3 C)-97.7 F (36.5 C)] 97.6 F (36.4 C) (10/06 1201) Pulse Rate:  [86-100] 88 (10/06 1201) Resp:  [12-19] 19 (10/06 1201) BP: (92-115)/(32-80) 114/51 mmHg (10/06 0742) SpO2:  [97 %-100 %] 100 % (10/06 1201) Weight:  [78.019 kg (172 lb)] 78.019 kg (172 lb) (10/06 0444) Last BM Date: 08/02/15  Intake/Output from previous day: 10/05 0701 - 10/06 0700 In: 6073.6 [P.O.:2760; I.V.:3313.6] Out: 1250 [Urine:1250] Intake/Output this shift: Total I/O In: 690 [P.O.:660; I.V.:30] Out: 300 [Urine:300]  PE:  Abdomen--soft nontender  Lab Results:  Recent Labs  08/01/15 0431 08/01/15 1135 08/01/15 1720 08/01/15 2220 08/02/15 0520  WBC 21.1* 23.2* 20.5* 21.7* 22.0*  HGB 7.3* 8.8* 7.6* 7.9* 8.3*  HCT 21.9* 25.6* 22.1* 23.2* 23.6*  PLT 139* 142* 133* 145* 164   BMET  Recent Labs  07/31/15 0403 08/01/15 0431  NA 145 141  K 4.2 3.5  CL 116* 113*  CO2 21* 22  CREATININE 1.05 0.90   LFT  Recent Labs  08/01/15 1720  PROT 3.1*  AST 55*  ALT 57  ALKPHOS 37*  BILITOT 1.1  BILIDIR 0.4  IBILI 0.7   PT/INR  Recent Labs  07/31/15 2249 08/01/15 0431 08/02/15 0520  LABPROT 22.0* 18.8* 16.5*  INR 1.94* 1.57* 1.32   PANCREAS No results for input(s): LIPASE in the last 72 hours.       Studies/Results: Ir Fluoro Guide Cv Line Left  07/31/2015   CLINICAL DATA:  69 year old male with a history of sepsis.  EXAM: PICC PLACEMENT WITH ULTRASOUND AND FLUOROSCOPY  FLUOROSCOPY TIME:  12 seconds  TECHNIQUE: After written informed consent was obtained, patient was placed in the supine position on angiographic table. Patency of the left brachial vein was confirmed with ultrasound with image documentation. An appropriate skin site was determined. Skin site was marked. Region was prepped using maximum barrier  technique including cap and mask, sterile gown, sterile gloves, large sterile sheet, and Chlorhexidine as cutaneous antisepsis. The region was infiltrated locally with 1% lidocaine. Under real-time ultrasound guidance, the left brachial vein was accessed with a 21 gauge micropuncture needle; the needle tip within the vein was confirmed with ultrasound image documentation. Needle exchanged over a 018 guidewire for a peel-away sheath, through which a 5-French double-lumen power injectable PICC trimmed to 47cm was advanced, positioned with its tip near the cavoatrial junction. Spot chest radiograph confirms appropriate catheter position. Catheter was flushed per protocol and secured externally with 0-Prolene sutures. The patient tolerated procedure well, with no immediate complication.  COMPLICATIONS: None  IMPRESSION: Status post placement of left upper extremity brachial vein double-lumen PICC, trimmed to 47 cm. Catheter ready for use.  Signed,  Yvone Neu. Loreta Ave, DO  [facility]  [facility]   Electronically Signed   By: Gilmer Mor D.O.   On: 07/31/2015 17:31   Ir US Guide Vasc Access Left  07/31/2015   CLINICAL DATA:  69 year old male with a history of sepsis.  EXAM: PICC PLACEMENT WITH ULTRASOUND AND FLUOROSCOPY  FLUOROSCOPY TIME:  12 seconds  TECHNIQUE: After written informed consent was obtained, patient was placed in the supine position on angiographic table. Patency of the left brachial vein was confirmed with ultrasound with image documentation. An appropriate skin site was determined. Skin site was marked. Region was prepped using maximum barrier technique including cap and  mask, sterile gown, sterile gloves, large sterile sheet, and Chlorhexidine as cutaneous antisepsis. The region was infiltrated locally with 1% lidocaine. Under real-time ultrasound guidance, the left brachial vein was accessed with a 21 gauge micropuncture needle; the needle tip within  the vein was confirmed with ultrasound image documentation. Needle exchanged over a 018 guidewire for a peel-away sheath, through which a 5-French double-lumen power injectable PICC trimmed to 47cm was advanced, positioned with its tip near the cavoatrial junction. Spot chest radiograph confirms appropriate catheter position. Catheter was flushed per protocol and secured externally with 0-Prolene sutures. The patient tolerated procedure well, with no immediate complication.  COMPLICATIONS: None  IMPRESSION: Status post placement of left upper extremity brachial vein double-lumen PICC, trimmed to 47 cm. Catheter ready for use.  Signed,  Yvone Neu. Loreta Ave, DO  [facility]  [facility]   Electronically Signed   By: Gilmer Mor D.O.   On: 07/31/2015 17:31    Medications: I have reviewed the patient's current medications.  Assessment/Plan: 1. UGI Bleed. Probably due to GU ? DU on protonix anticoagulation on hold. Should be ok to change to bolus PPI   Natayah Warmack JR,Aveena Bari L 08/02/2015, 12:49 PM  Pager: [phone] If no answer or after hours call [phone]

## 2015-08-02 NOTE — Progress Notes (Signed)
Physical Therapy Treatment Patient Details Name: [name] MRN: [id number] DOB: 06/15/1946 Today's Date: 08/02/2015    History of Present Illness 69 y.o. 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 Elevated troponin due to gastric bleeding. Palliative consult results pending.    PT Comments    Patient in bed, agreeable to participate in PT today, initially more alert/engaged in PT but quickly became distracted and did not want to move around any more. Patient with elevated troponins, RN agreed that bed exercise appropriate and good for patient to participate in. Patient reported he was comfortable and denied further bed mobility. Patient will benefit from continued PT dependent on patient participation in therapy and pending family palliative consult results.   Follow Up Recommendations  Home health PT;Supervision/Assistance - 24 hour (Pending results of family palliative consult.)     Equipment Recommendations  None recommended by PT    Recommendations for Other Services       Precautions / Restrictions Precautions Precaution Comments: Patient bedbound for unknown amount of time. Restrictions Weight Bearing Restrictions: No    Mobility  Bed Mobility               General bed mobility comments: Bed mobility deferred due to patient unwillingness to move and current level of comfort.  Transfers                    Ambulation/Gait                 Stairs            Wheelchair Mobility    Modified Rankin (Stroke Patients Only)       Balance                                    Cognition Arousal/Alertness: Awake/alert (More awake/alert than previous.) Behavior During Therapy: Flat affect Overall Cognitive Status: No family/caregiver present to determine  baseline cognitive functioning                      Exercises General Exercises - Upper Extremity Shoulder Flexion: AROM;Both;10 reps;Supine Shoulder ABduction: AROM;Both;10 reps;Supine Elbow Extension: AROM;Right;10 reps;Supine    General Comments        Pertinent Vitals/Pain Pain Assessment: No/denies pain    Home Living                      Prior Function            PT Goals (current goals can now be found in the care plan section) Acute Rehab PT Goals Patient Stated Goal: Feel better PT Goal Formulation: Patient unable to participate in goal setting Time For Goal Achievement: 08/08/15 Potential to Achieve Goals: Fair Progress towards PT goals: Not progressing toward goals - comment (Due to patient motivation to participate.)    Frequency  Min 2X/week    PT Plan Current plan remains appropriate    Co-evaluation             End of Session   Activity Tolerance: Patient limited by fatigue;Other (comment) (Patient with limited motivation to participate.) Patient left: in bed;with call bell/phone within reach;with nursing/sitter in room     Time: [phone]  PT Time Calculation (min) (ACUTE ONLY): 10 min  Charges:  $Therapeutic Exercise: 8-22 mins                    G CodesMichele Rockers, SPT [phone] 08/02/2015, 9:25 AM  I have read, reviewed and agree with student's note.   [facility] Acute Rehabilitation [phone] (pager)

## 2015-08-02 NOTE — Progress Notes (Signed)
Inpatient Diabetes Program Recommendations  AACE/ADA: New Consensus Statement on Inpatient Glycemic Control (2015)  Target Ranges:  Prepandial:   less than 140 mg/dL      Peak postprandial:   less than 180 mg/dL (1-2 hours)      Critically ill patients:  140 - 180 mg/dL   Review of Glycemic Control:  Results for EITHAN, BEAGLE (MRN [id number]) as of 08/02/2015 13:46  Ref. Range 08/01/2015 12:21 08/01/2015 16:23 08/01/2015 21:39 08/02/2015 07:40 08/02/2015 11:59  Glucose-Capillary Latest Ref Range: 65-99 mg/dL 409 (H) 811 (H) 914 (H) 175 (H) 200 (H)    May consider adding Levemir 10 units daily while on IV steroids.  Thanks, Beryl Meager, RN, BC-ADM Inpatient Diabetes Coordinator Pager [phone] (8a-5p)

## 2015-08-02 NOTE — Progress Notes (Signed)
Apparently the protonix infusion was stopped but not changed to oral  Will order 40 mg tabs BID

## 2015-08-02 NOTE — Care Management Important Message (Signed)
Important Message  Patient Details  Name: [name] MRN: [id number] Date of Birth: 02/02/1946   Medicare Important Message Given:  Yes-fourth notification given    Kyla Balzarine 08/02/2015, 11:08 AM

## 2015-08-02 NOTE — Progress Notes (Addendum)
Daily Progress Note   Patient Name: [name]       Date: 08/02/2015 DOB: 1945-12-04  Age: 69 y.o. MRN#: [id number] Attending Physician: Nestor Ramp, MD Primary Care Physician: Julian Hy, MD Admit Date: 07/24/2015  Reason for Consultation/Follow-up: Establishing goals of care  Subjective:  awake alert, pleasant, resting in bed. In no distress.   Interval Events: Discussed with patient and son Orvilla Fus , they are still thinking about code status and further goals of care. The patient's son doesn't want his father to think that "I gave up on him". Son states that code status discussions were held in the ED and the patient had elected full code. How ever, the son also states that his wife is an Charity fundraiser and that they understand he has multiple medical conditions and that he is considering dnr dni. The patient has another son who lives in [place], [place]. Orvilla Fus states that he has to make all decisions pertaining to the patient. Orvilla Fus also does not want the patient to go to an SNF.  Discussed the patient's low albumin level, multiple medical conditions, and all questions answered to the best of my ability.  Will follow up again.   Length of Stay: 9 days  Current Medications: Scheduled Meds:  . atorvastatin  40 mg Oral q1800  . digoxin  125 mcg Oral Daily  . feeding supplement (GLUCERNA SHAKE)  237 mL Oral TID BM  . hydrocortisone sod succinate (SOLU-CORTEF) inj  50 mg Intravenous Q6H  . insulin aspart  0-15 Units Subcutaneous TID WC  . insulin aspart  0-5 Units Subcutaneous QHS  . levothyroxine  50 mcg Oral QAC breakfast  . silver sulfADIAZINE  1 application Topical Daily  . sodium chloride  3 mL Intravenous Q12H    Continuous Infusions: . lactated ringers 1,000 mL (08/02/15 0921)  . sodium chloride 0.9 % 1,000 mL infusion 10 mL/hr at 08/01/15 2049    PRN Meds: acetaminophen  Palliative Performance Scale: 40 %     Vital Signs: BP 114/51 mmHg  Pulse 86  Temp(Src) 97.6 F  (36.4 C) (Oral)  Resp 15  Ht  (1.803 m)  Wt 78.019 kg (172 lb)  BMI 24.00 kg/m2  SpO2 99% SpO2: SpO2: 99 % O2 Device: O2 Device: Not Delivered O2 Flow Rate: O2 Flow Rate (L/min): 3 L/min  Intake/output summary:  Intake/Output Summary (Last 24 hours) at 08/02/15 1135 Last data filed at 08/02/15 1000  Gross per 24 hour  Intake 4938.58 ml  Output   1550 ml  Net 3388.58 ml   LBM:   Baseline Weight: Weight: 58.968 kg (130 lb) Most recent weight: Weight: 78.019 kg (172 lb)  Physical Exam:  weak NAD Diminished S1s2 No edema Edema ue            Additional Data Reviewed: Recent Labs     07/31/15  0403   08/01/15  0431   08/01/15  2220  08/02/15  0520  WBC  24.9*   < >  21.1*   < >  21.7*  22.0*  HGB  8.3*   < >  7.3*   < >  7.9*  8.3*  PLT  182   < >  139*   < >  145*  164  NA  145   --   141   --    --    --   BUN  55*   --   36*   --    --    --  CREATININE  1.05   --   0.90   --    --    --    < > = values in this interval not displayed.     Problem List:  Patient Active Problem List   Diagnosis Date Noted  . Goals of care, counseling/discussion   . Encounter for palliative care   . Elevated INR   . Gastrointestinal hemorrhage associated with gastric ulcer   . Coagulopathy (HCC)   . Infection   . Acute confusional state   . Pressure ulcer 07/25/2015  . Dry gangrene (HCC)   . Blood poisoning (HCC)   . FTT (failure to thrive) in adult 07/24/2015  . Hypotension 07/24/2015  . Sepsis (HCC) 07/24/2015  . Renal failure (ARF), acute on chronic (HCC) 08/15/2014  . Diabetes mellitus with renal manifestations, uncontrolled (HCC) 08/15/2014  . PVD (peripheral vascular disease) (HCC) 08/15/2014  . Hypothyroid 08/15/2014  . Hyperlipidemia 08/15/2014  . Amputation of left lower extremity below knee (HCC) 08/15/2014  . Atrial fibrillation (HCC) 08/15/2014  . CVA, old, aphasia 08/15/2014  . Ataxia S/P CVA 08/15/2014  . Nausea & vomiting 08/15/2014  . Carotid  artery narrowings 08/15/2014  . COPD (chronic obstructive pulmonary disease) (HCC) 08/15/2014  . Adult failure to thrive 08/15/2014  . DM 11/26/2009  . Hypertension 11/26/2009  . HYPERTENSION 11/26/2009  . Coronary atherosclerosis 11/26/2009  . ACUTE OSTEOMYELITIS OTHER SPECIFIED SITE 11/21/2009     Palliative Care Assessment & Plan    Code Status:  Full code  Goals of Care:   ongoing discussions regarding code status and goals of care.   Desire for further Chaplaincy support:no  3. Symptom Management:   no acute symptoms currently  4. Palliative Prophylaxis:  Stool Softener:  Patient with GIB, has rectal tube with dark stool liquidy. Continue to monitor.  5. Prognosis: < 6 months probably  5. Discharge Planning: pending further clinical course   Care plan was discussed with  Patient and son Orvilla Fus  Thank you for allowing the Palliative Medicine Team to assist in the care of this patient.   Time In: 1000 Time Out: 1025 Total Time 25 Prolonged Time Billed  no     Greater than 50%  of this time was spent counseling and coordinating care related to the above assessment and plan.  Quatavious Rossa Gwenlyn Saran, MD  08/02/2015, 11:35 AM  [phone]  Please contact Palliative Medicine Team phone at [phone] for questions and concerns.
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
## 2015-08-03 ENCOUNTER — Inpatient Hospital Stay (HOSPITAL_COMMUNITY): Payer: Commercial Managed Care - HMO

## 2015-08-03 DIAGNOSIS — I96 Gangrene, not elsewhere classified: Secondary | ICD-10-CM | POA: Insufficient documentation

## 2015-08-03 DIAGNOSIS — L8992 Pressure ulcer of unspecified site, stage 2: Secondary | ICD-10-CM | POA: Insufficient documentation

## 2015-08-03 LAB — CBC
HCT: 23.4 % — ABNORMAL LOW (ref 39.0–52.0)
HCT: 23.6 % — ABNORMAL LOW (ref 39.0–52.0)
HEMOGLOBIN: 8 g/dL — AB (ref 13.0–17.0)
Hemoglobin: 8 g/dL — ABNORMAL LOW (ref 13.0–17.0)
MCH: 30.1 pg (ref 26.0–34.0)
MCH: 30 pg (ref 26.0–34.0)
MCHC: 34.2 g/dL (ref 30.0–36.0)
MCHC: 33.9 g/dL (ref 30.0–36.0)
MCV: 88 fL (ref 78.0–100.0)
MCV: 88.4 fL (ref 78.0–100.0)
PLATELETS: 173 10*3/uL (ref 150–400)
PLATELETS: 191 10*3/uL (ref 150–400)
RBC: 2.66 MIL/uL — AB (ref 4.22–5.81)
RBC: 2.67 MIL/uL — ABNORMAL LOW (ref 4.22–5.81)
RDW: 17.6 % — ABNORMAL HIGH (ref 11.5–15.5)
RDW: 17.9 % — AB (ref 11.5–15.5)
WBC: 22.8 10*3/uL — ABNORMAL HIGH (ref 4.0–10.5)
WBC: 21.5 10*3/uL — ABNORMAL HIGH (ref 4.0–10.5)

## 2015-08-03 LAB — BASIC METABOLIC PANEL
Anion gap: 5 (ref 5–15)
BUN: 29 mg/dL — AB (ref 6–20)
CO2: 23 mmol/L (ref 22–32)
Calcium: 7.4 mg/dL — ABNORMAL LOW (ref 8.9–10.3)
Chloride: 107 mmol/L (ref 101–111)
Creatinine, Ser: 0.95 mg/dL (ref 0.61–1.24)
GFR calc Af Amer: >60 mL/min (ref >60)
Glucose, Bld: 157 mg/dL — ABNORMAL HIGH (ref 65–99)
POTASSIUM: 3.9 mmol/L (ref 3.5–5.1)
SODIUM: 135 mmol/L (ref 135–145)

## 2015-08-03 LAB — GLUCOSE, CAPILLARY
GLUCOSE-CAPILLARY: 141 mg/dL — AB (ref 65–99)
GLUCOSE-CAPILLARY: 261 mg/dL — AB (ref 65–99)
GLUCOSE-CAPILLARY: 93 mg/dL (ref 65–99)

## 2015-08-03 LAB — PROTIME-INR
INR: 1.47 (ref 0.00–1.49)
PROTHROMBIN TIME: 17.9 s — AB (ref 11.6–15.2)

## 2015-08-03 NOTE — Progress Notes (Signed)
Utilization Review Completed.  

## 2015-08-03 NOTE — Consult Note (Signed)
Central Venous Catheter Insertion Procedure Note ALONZO OWCZARZAK [id number] February 04, 1946  Procedure: Insertion of Central Venous Catheter Indications: Assessment of intravascular volume, Drug and/or fluid administration and Frequent blood sampling  Procedure Details Consent: Risks of procedure as well as the alternatives and risks of each were explained to the (patient/caregiver).  Consent for procedure obtained. Time Out: Verified patient identification, verified procedure, site/side was marked, verified correct patient position, special equipment/implants available, medications/allergies/relevent history reviewed, required imaging and test results available.  Performed  Maximum sterile technique was used including antiseptics, cap, gloves, gown, hand hygiene, mask and sheet. Skin prep: Chlorhexidine; local anesthetic administered A antimicrobial bonded/coated triple lumen catheter was placed in the left internal jugular vein using the Seldinger technique. Ultrasound guidance used.Yes.   Catheter placed to 20 cm. Blood aspirated via all 3 ports and then flushed x 3. Line sutured x 2 and dressing applied.  Evaluation Blood flow good Complications: No apparent complications Patient did tolerate procedure well. Chest X-ray ordered to verify placement.  CXR: pending.  Joneen Roach, AGACNP-BC [facility] Pulmonology/Critical Care Pager [phone] or [phone]  08/03/2015 2:01 AM

## 2015-08-03 NOTE — Progress Notes (Signed)
Daily Progress Note   Patient Name: [name]       Date: 08/03/2015 DOB: 02/25/1946  Age: 69 y.o. MRN#: [id number] Attending Physician: Nestor Ramp, MD Primary Care Physician: Julian Hy, MD Admit Date: 07/24/2015  Reason for Consultation/Follow-up: Establishing goals of care  Subjective:  awake alert, pleasant, resting in bed. Appears dyspneic at times.    Interval Events: Discussed with patient and son Orvilla Fus , along with bedside RN Leotis Shames. Patient's son states that he has had further discussions with his wife, he states that he doesn't want his father to suffer, he does not believe that his father should undergo resuscitation or CPR. We discussed DNR DNI in great detail. concepts pertaining to dignity and comfort were also discussed. Re introduced Hospice care.   DNR DNI established.  Son would like to continue current therapies at least for the next 24 hours or so. We will continue goals discussions and help guide appropriate medical decision making.     Will follow up again in am.  Length of Stay: 10 days  Current Medications: Scheduled Meds:  . atorvastatin  40 mg Oral q1800  . digoxin  125 mcg Oral Daily  . feeding supplement (GLUCERNA SHAKE)  237 mL Oral TID BM  . hydrocortisone sod succinate (SOLU-CORTEF) inj  50 mg Intravenous Q6H  . insulin aspart  0-15 Units Subcutaneous TID WC  . insulin aspart  0-5 Units Subcutaneous QHS  . levothyroxine  50 mcg Oral QAC breakfast  . metoprolol tartrate  12.5 mg Oral BID  . pantoprazole  40 mg Oral BID AC  . silver sulfADIAZINE  1 application Topical Daily  . sodium chloride  3 mL Intravenous Q12H    Continuous Infusions: . lactated ringers 125 mL/hr at 08/03/15 0305  . sodium chloride 0.9 % 1,000 mL infusion 10 mL/hr at 08/01/15 2049    PRN Meds: acetaminophen  Palliative Performance Scale: 30 %     Vital Signs: BP 97/71 mmHg  Pulse 94  Temp(Src) 97.4 F (36.3 C) (Oral)  Resp 20  Ht  (1.803  m)  Wt 78.019 kg (172 lb)  BMI 24.00 kg/m2  SpO2 99% SpO2: SpO2: 99 % O2 Device: O2 Device: Not Delivered O2 Flow Rate: O2 Flow Rate (L/min): 3 L/min  Intake/output summary:   Intake/Output Summary (Last 24 hours) at 08/03/15 0828 Last data filed at 08/03/15 0438  Gross per 24 hour  Intake   3759 ml  Output   1025 ml  Net   2734 ml   LBM:   Baseline Weight: Weight: 58.968 kg (130 lb) Most recent weight: Weight: 78.019 kg (172 lb)  Physical Exam:  weak NAD Diminished S1s2 No edema Edema ue            Additional Data Reviewed: Recent Labs     08/02/15  1218  08/02/15  1325  08/03/15  0625  08/03/15  0710  WBC   --   18.8*   --   22.8*  HGB   --   7.6*   --   8.0*  PLT   --   159   --   173  NA  139   --   135   --   BUN  32*   --   29*   --   CREATININE  0.95   --   0.95   --      Problem List:  Patient Active Problem List   Diagnosis  Date Noted  . Goals of care, counseling/discussion   . Encounter for palliative care   . Elevated INR   . Gastrointestinal hemorrhage associated with gastric ulcer   . Coagulopathy (HCC)   . Infection   . Acute confusional state   . Pressure ulcer 07/25/2015  . Dry gangrene (HCC)   . Blood poisoning (HCC)   . FTT (failure to thrive) in adult 07/24/2015  . Hypotension 07/24/2015  . Sepsis (HCC) 07/24/2015  . Renal failure (ARF), acute on chronic (HCC) 08/15/2014  . Diabetes mellitus with renal manifestations, uncontrolled (HCC) 08/15/2014  . PVD (peripheral vascular disease) (HCC) 08/15/2014  . Hypothyroid 08/15/2014  . Hyperlipidemia 08/15/2014  . Amputation of left lower extremity below knee (HCC) 08/15/2014  . Atrial fibrillation (HCC) 08/15/2014  . CVA, old, aphasia 08/15/2014  . Ataxia S/P CVA 08/15/2014  . Nausea & vomiting 08/15/2014  . Carotid artery narrowings 08/15/2014  . COPD (chronic obstructive pulmonary disease) (HCC) 08/15/2014  . Adult failure to thrive 08/15/2014  . DM 11/26/2009  . Hypertension  11/26/2009  . HYPERTENSION 11/26/2009  . Coronary atherosclerosis 11/26/2009  . ACUTE OSTEOMYELITIS OTHER SPECIFIED SITE 11/21/2009     Palliative Care Assessment & Plan    Code Status:  DNR DNI  Goals of Care: DNR DNI established after further extensive discussions with the patient's son Orvilla Fus   ongoing discussions regarding  goals of care.   Desire for further Chaplaincy support:no  3. Symptom Management:  no acute symptoms currently, continue to monitor for dyspnea, signs of volume overload 4. Palliative Prophylaxis:  Stool Softener:  Patient with GIB, has rectal tube with dark stool liquidy. Continue to monitor.  5. Prognosis: < 6 months    5. Discharge Planning: pending further clinical course   Care plan was discussed with  Patient and son Orvilla Fus and bedside RN  Thank you for allowing the Palliative Medicine Team to assist in the care of this patient.   Time In: 0800 Time Out: 0825 Total Time 25 Prolonged Time Billed  no     Greater than 50%  of this time was spent counseling and coordinating care related to the above assessment and plan.  Issabelle Mcraney Gwenlyn Saran, MD  08/03/2015, 8:28 AM  [phone]  Please contact Palliative Medicine Team phone at [phone] for questions and concerns.

## 2015-08-03 NOTE — Progress Notes (Signed)
Subjective:  Denies any complaints. Objective:  Vital Signs in the last 24 hours: Temp:  [97.3 F (36.3 C)-98.3 F (36.8 C)] 97.5 F (36.4 C) (10/07 1200) Pulse Rate:  [70-98] 88 (10/07 1400) Resp:  [14-23] 23 (10/07 1400) BP: (78-114)/(28-79) 95/43 mmHg (10/07 1400) SpO2:  [99 %-100 %] 99 % (10/07 1200)  Intake/Output from previous day: 10/06 0701 - 10/07 0700 In: 3884 [P.O.:1734; I.V.:2150] Out: 1325 [Urine:1325] Intake/Output from this shift: Total I/O In: 985 [P.O.:360; I.V.:625] Out: 300 [Urine:300]  Physical Exam: Neck: no adenopathy, no carotid bruit, no JVD and supple, symmetrical, trachea midline Lungs: clear anterolaterally Heart: irregularly irregular rhythm, S1, S2 normal and 2/6 systolic murmur noted Abdomen: soft, non-tender; bowel sounds normal; no masses,  no organomegaly Extremities: right AKA and left BKA noted.  Surgical dressing right hand noted  Lab Results:  Recent Labs  08/02/15 1325 08/03/15 0710  WBC 18.8* 22.8*  HGB 7.6* 8.0*  PLT 159 173    Recent Labs  08/02/15 1218 08/03/15 0625  NA 139 135  K 3.8 3.9  CL 113* 107  CO2 23 23  GLUCOSE 212* 157*  BUN 32* 29*  CREATININE 0.95 0.95    Recent Labs  08/02/15 1218 08/02/15 1545  TROPONINI 0.59* 0.53*   Hepatic Function Panel  Recent Labs  08/01/15 1720  PROT 3.1*  ALBUMIN 1.6*  AST 55*  ALT 57  ALKPHOS 37*  BILITOT 1.1  BILIDIR 0.4  IBILI 0.7   No results for input(s): CHOL in the last 72 hours. No results for input(s): PROTIME in the last 72 hours.  Imaging: Imaging results have been reviewed and Dg Chest Port 1 View  08/03/2015   CLINICAL DATA:  Central line placement.  Initial encounter.  EXAM: PORTABLE CHEST 1 VIEW  COMPARISON:  Chest radiograph performed 07/24/2015  FINDINGS: A left IJ line is noted ending about the distal SVC.  Small bilateral pleural effusions are noted, left greater than right, with bibasilar airspace opacities, likely reflecting pulmonary  edema. This is new from the prior study. No pneumothorax is seen.  The cardiomediastinal silhouette is borderline normal in size. The patient is status post median sternotomy, with evidence of prior CABG. No acute osseous abnormalities are identified.  IMPRESSION: 1. Left IJ line noted ending about the distal SVC. 2. Small bilateral pleural effusions, left greater than right, with bibasilar airspace opacities, likely reflecting pulmonary edema.   Electronically Signed   By: Roanna Raider M.D.   On: 08/03/2015 02:32    Cardiac Studies:  Assessment/Plan:  Probable very small non-Q-wave myocardial infarction secondary to demand ischemia secondary to hypotensive shock/acute blood loss anemia secondary to GI bleed. Peptic ulcer disease Coronary artery disease history of MI in the past Moderate aortic stenosis Chronic atrial fibrillation with chadsvasc score of 7 Status post Coumadin toxicity Hypertension Diabetes mellitus Hyperlipidemia Peripheral vascular disease status post right AKA and left BKA Hypothyroidism COPD GERD Chronic kidney disease stage II stable Malnutrition Marked leukocytosis Plan DC Lopressor in view of hypotension. I will sign off.  Please call if needed  LOS: 10 days    Rinaldo Cloud 08/03/2015, 4:50 PM
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
## 2015-08-03 NOTE — Progress Notes (Signed)
Spoke with MD at bedside about pts hypotension, no new changes at this time.

## 2015-08-03 NOTE — Progress Notes (Signed)
Notified that Hopkin's PICC line slid out. IV team was noted and asked to replace but per IV team as his arms are very edematous this is not possible and should consider IR PICC placement in AM  BP 94/59 mmHg  Pulse 89  Temp(Src) 97.3 F (36.3 C) (Oral)  Resp 19  Ht  (1.803 m)  Wt 172 lb (78.019 kg)  BMI 24.00 kg/m2  SpO2 99%  A/P Given tenuous blood pressures with solucortef requirement and IVF with frequent need for blood transfusions he has need for access. As peripheral lines are unable to be placed and PICC line cannot be placed until tomorrow the remaining option in central line placement. Will call CCM for consideration of central line placement  Maeli Spacek A. Kennon Rounds MD, MS Family Medicine Resident PGY-2 Pager [phone]

## 2015-08-03 NOTE — Progress Notes (Signed)
Patient denies any pain/discomfort or any dyspnea at this time. Discussed plan of care with patient. Patient continues to have waxing and waning orientation to time and place.   Asiyah Mayra Reel, MD 08/03/2015, 4:00 PM PGY-1, [facility] Family Medicine Service pager [phone]

## 2015-08-03 NOTE — Progress Notes (Signed)
[facility] PROGRESS NOTE Subjective no gross bleeding. Patient on PPI therapy now. Palliative care involved.  Objective: Vital signs in last 24 hours: Temp:  [97.3 F (36.3 C)-98.8 F (37.1 C)] 97.4 F (36.3 C) (10/07 0748) Pulse Rate:  [70-105] 94 (10/07 0748) Resp:  [14-20] 20 (10/07 0748) BP: (81-114)/(29-75) 81/38 mmHg (10/07 0913) SpO2:  [99 %-100 %] 99 % (10/07 0748) Last BM Date: 08/02/15  Intake/Output from previous day: 10/06 0701 - 10/07 0700 In: 1610 [P.O.:1734; I.V.:2150] Out: 1325 [Urine:1325] Intake/Output this shift: Total I/O In: 120 [P.O.:120] Out: -      Lab Results:  Recent Labs  08/01/15 1720 08/01/15 2220 08/02/15 0520 08/02/15 1325 08/03/15 0710  WBC 20.5* 21.7* 22.0* 18.8* 22.8*  HGB 7.6* 7.9* 8.3* 7.6* 8.0*  HCT 22.1* 23.2* 23.6* 22.1* 23.4*  PLT 133* 145* 164 159 173   BMET  Recent Labs  08/01/15 0431 08/02/15 1218 08/03/15 0625  NA 141 139 135  K 3.5 3.8 3.9  CL 113* 113* 107  CO2 CREATININE 0.90 0.95 0.95   LFT  Recent Labs  08/01/15 1720  PROT 3.1*  AST 55*  ALT 57  ALKPHOS 37*  BILITOT 1.1  BILIDIR 0.4  IBILI 0.7   PT/INR  Recent Labs  08/01/15 0431 08/02/15 0520 08/03/15 0625  LABPROT 18.8* 16.5* 17.9*  INR 1.57* 1.32 1.47   PANCREAS No results for input(s): LIPASE in the last 72 hours.       Studies/Results: Dg Chest Port 1 View  08/03/2015   CLINICAL DATA:  Central line placement.  Initial encounter.  EXAM: PORTABLE CHEST 1 VIEW  COMPARISON:  Chest radiograph performed 07/24/2015  FINDINGS: A left IJ line is noted ending about the distal SVC.  Small bilateral pleural effusions are noted, left greater than right, with bibasilar airspace opacities, likely reflecting pulmonary edema. This is new from the prior study. No pneumothorax is seen.  The cardiomediastinal silhouette is borderline normal in size. The patient is status post median sternotomy, with evidence of prior CABG.  No acute osseous abnormalities are identified.  IMPRESSION: 1. Left IJ line noted ending about the distal SVC. 2. Small bilateral pleural effusions, left greater than right, with bibasilar airspace opacities, likely reflecting pulmonary edema.   Electronically Signed   By: Roanna Raider M.D.   On: 08/03/2015 02:32    Medications: I have reviewed the patient's current medications.  Assessment/Plan: 1. Upper G.I. bleeding. Probably due to gastric ulcer. Multiple other issues involved. Appears to be relatively stable. Would continue on PPI. We will be available for further significant bleeding but will sign off. Please call us if needed.   September Mormile JR,Kadeem Hyle L 08/03/2015, 12:28 PM  Pager: [phone] If no answer or after hours call [phone]

## 2015-08-04 DIAGNOSIS — Z7189 Other specified counseling: Secondary | ICD-10-CM

## 2015-08-04 DIAGNOSIS — I959 Hypotension, unspecified: Secondary | ICD-10-CM | POA: Insufficient documentation

## 2015-08-04 LAB — CBC
HCT: 22.2 % — ABNORMAL LOW (ref 39.0–52.0)
HEMATOCRIT: 24 % — AB (ref 39.0–52.0)
Hemoglobin: 8 g/dL — ABNORMAL LOW (ref 13.0–17.0)
Hemoglobin: 7.4 g/dL — ABNORMAL LOW (ref 13.0–17.0)
MCH: 29.7 pg (ref 26.0–34.0)
MCH: 30.1 pg (ref 26.0–34.0)
MCHC: 33.3 g/dL (ref 30.0–36.0)
MCHC: 33.3 g/dL (ref 30.0–36.0)
MCV: 89.2 fL (ref 78.0–100.0)
MCV: 90.2 fL (ref 78.0–100.0)
PLATELETS: 187 10*3/uL (ref 150–400)
Platelets: 200 10*3/uL (ref 150–400)
RBC: 2.69 MIL/uL — ABNORMAL LOW (ref 4.22–5.81)
RBC: 2.46 MIL/uL — ABNORMAL LOW (ref 4.22–5.81)
RDW: 18.3 % — AB (ref 11.5–15.5)
RDW: 18.8 % — AB (ref 11.5–15.5)
WBC: 22.4 10*3/uL — ABNORMAL HIGH (ref 4.0–10.5)
WBC: 22.7 10*3/uL — ABNORMAL HIGH (ref 4.0–10.5)

## 2015-08-04 LAB — PROTIME-INR
INR: 1.35 (ref 0.00–1.49)
Prothrombin Time: 16.8 seconds — ABNORMAL HIGH (ref 11.6–15.2)

## 2015-08-04 LAB — GLUCOSE, CAPILLARY
GLUCOSE-CAPILLARY: 123 mg/dL — AB (ref 65–99)
GLUCOSE-CAPILLARY: 158 mg/dL — AB (ref 65–99)
Glucose-Capillary: 330 mg/dL — ABNORMAL HIGH (ref 65–99)
Glucose-Capillary: 286 mg/dL — ABNORMAL HIGH (ref 65–99)

## 2015-08-04 LAB — BASIC METABOLIC PANEL
ANION GAP: 8 (ref 5–15)
BUN: 25 mg/dL — AB (ref 6–20)
CALCIUM: 7.8 mg/dL — AB (ref 8.9–10.3)
CO2: 24 mmol/L (ref 22–32)
CREATININE: 0.86 mg/dL (ref 0.61–1.24)
Chloride: 108 mmol/L (ref 101–111)
GFR calc Af Amer: >60 mL/min (ref >60)
GLUCOSE: 124 mg/dL — AB (ref 65–99)
Potassium: 4.3 mmol/L (ref 3.5–5.1)
Sodium: 140 mmol/L (ref 135–145)

## 2015-08-04 MED ORDER — SODIUM CHLORIDE 0.9 % IJ SOLN
10.0000 mL | Freq: Two times a day (BID) | INTRAMUSCULAR | Status: DC
  Administered 2015-08-04 – 2015-08-07 (×5): 10 mL via INTRAVENOUS

## 2015-08-04 NOTE — Progress Notes (Signed)
Telephone conversation held with patient's son and POA, Orvilla Fus, who stated that he had had a conversation with Dr. Jennette Kettle today, discussing goals of care.  He stated he wanted time to come to an understanding in his own mind as to what was in the best interest of his father.  He stated he wanted to stop steroidal treatment and IV fluids.  Attempts to reach Dr. Jennette Kettle proved futile. Will adhere to patient and family's wishes by documenting desires and providing support to them both.  Son stated, "He's my best friend.....  always has been."
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
## 2015-08-05 LAB — PROTIME-INR
INR: 1.42 (ref 0.00–1.49)
Prothrombin Time: 17.5 seconds — ABNORMAL HIGH (ref 11.6–15.2)

## 2015-08-05 LAB — CBC
HCT: 23.5 % — ABNORMAL LOW (ref 39.0–52.0)
Hemoglobin: 7.8 g/dL — ABNORMAL LOW (ref 13.0–17.0)
MCH: 30.2 pg (ref 26.0–34.0)
MCHC: 33.2 g/dL (ref 30.0–36.0)
MCV: 91.1 fL (ref 78.0–100.0)
PLATELETS: 222 10*3/uL (ref 150–400)
RBC: 2.58 MIL/uL — AB (ref 4.22–5.81)
RDW: 19.1 % — AB (ref 11.5–15.5)
WBC: 23.6 10*3/uL — AB (ref 4.0–10.5)

## 2015-08-05 LAB — BASIC METABOLIC PANEL
ANION GAP: 8 (ref 5–15)
BUN: 31 mg/dL — AB (ref 6–20)
CALCIUM: 7.8 mg/dL — AB (ref 8.9–10.3)
CO2: 22 mmol/L (ref 22–32)
Chloride: 107 mmol/L (ref 101–111)
Creatinine, Ser: 1.06 mg/dL (ref 0.61–1.24)
GFR calc Af Amer: >60 mL/min (ref >60)
GLUCOSE: 235 mg/dL — AB (ref 65–99)
POTASSIUM: 4.2 mmol/L (ref 3.5–5.1)
SODIUM: 137 mmol/L (ref 135–145)

## 2015-08-05 LAB — GLUCOSE, CAPILLARY
GLUCOSE-CAPILLARY: 173 mg/dL — AB (ref 65–99)
Glucose-Capillary: 216 mg/dL — ABNORMAL HIGH (ref 65–99)
Glucose-Capillary: 182 mg/dL — ABNORMAL HIGH (ref 65–99)
Glucose-Capillary: 154 mg/dL — ABNORMAL HIGH (ref 65–99)

## 2015-08-05 LAB — TYPE AND SCREEN
ABO/RH(D): A POS
ANTIBODY SCREEN: NEGATIVE

## 2015-08-05 MED ORDER — PREDNISONE 50 MG PO TABS
50.0000 mg | ORAL_TABLET | Freq: Every day | ORAL | Status: DC
  Administered 2015-08-06 – 2015-08-07 (×2): 50 mg via ORAL
  Filled 2015-08-05 (×2): qty 1

## 2015-08-05 NOTE — Progress Notes (Signed)
Chaplain was called by unit after patient's son called to request chaplain visit. I sat with [name], who did not make meaningful communication except "white" periodically and a few "uh huh"s. I read Psalm 23rd, talked about God's on-going care for him...  prayed.  Pastoral care will follow as needed.  Rev. [address], [place] [phone]
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
## 2015-08-06 DIAGNOSIS — I9589 Other hypotension: Secondary | ICD-10-CM

## 2015-08-06 LAB — CBC
HEMATOCRIT: 23.8 % — AB (ref 39.0–52.0)
HEMOGLOBIN: 7.8 g/dL — AB (ref 13.0–17.0)
MCH: 29.7 pg (ref 26.0–34.0)
MCHC: 32.8 g/dL (ref 30.0–36.0)
MCV: 90.5 fL (ref 78.0–100.0)
Platelets: 222 10*3/uL (ref 150–400)
RBC: 2.63 MIL/uL — AB (ref 4.22–5.81)
RDW: 19.3 % — ABNORMAL HIGH (ref 11.5–15.5)
WBC: 21.1 10*3/uL — AB (ref 4.0–10.5)

## 2015-08-06 LAB — GLUCOSE, CAPILLARY
GLUCOSE-CAPILLARY: 145 mg/dL — AB (ref 65–99)
GLUCOSE-CAPILLARY: 184 mg/dL — AB (ref 65–99)
GLUCOSE-CAPILLARY: 211 mg/dL — AB (ref 65–99)
GLUCOSE-CAPILLARY: 238 mg/dL — AB (ref 65–99)
Glucose-Capillary: 148 mg/dL — ABNORMAL HIGH (ref 65–99)

## 2015-08-06 NOTE — Progress Notes (Signed)
PT Cancellation Note/ discharge  Patient Details Name: [name] MRN: [id number] DOB: November 22, 1945   Cancelled Treatment:    Reason Eval/Treat Not Completed: Other (comment) (pt has transitioned to comfort measures only and will sign off at this time)   Delorse Lek 08/06/2015, 12:46 PM Delaney Meigs, PT [phone]

## 2015-08-06 NOTE — Progress Notes (Signed)
Chart reviewed, weekend events noted.  Call placed and discussed briefly with son Orvilla Fus. He states he will arrive at the hospital around noon today.  Palliative provider will meet with the patient's son  Agree that full scope of comfort measures and addition of hospice services seems to be the most medically appropriate pathway.  Discussed with son that SNF might not be appropriate. It is anticipated that the patient may have days to some very limited number of weeks for his prognosis.  Will discuss face to face with son in detail later today. Additional recommendations will follow.   Rosalin Hawking, MD [phone] [place] palliative medicine team

## 2015-08-06 NOTE — Progress Notes (Signed)
   08/06/15 1440  Clinical Encounter Type  Visited With Patient and family together  Visit Type Spiritual support  Referral From Nurse  Spiritual Encounters  Spiritual Needs Emotional  Stress Factors  Patient Stress Factors Major life changes;Health changes  Family Stress Factors Exhausted;Major life changes  Nurse recommended visit with son of patient; son not present, had gone home to rest with friend staying. Friend indicated son exhausted from staying at bedside. Had nice conversation with friend and learned a lot about patient's situation. Patient woke briefly and chaplain was able to identify herself, but he went quickly back to sleep.

## 2015-08-06 NOTE — Clinical Social Work Note (Signed)
Clinical Social Work Assessment  Patient Details  Name: [name] MRN: [id number] Date of Birth: Jul 10, 1946  Date of referral:  08/06/15               Reason for consult:  End of Life/Hospice                Permission sought to share information with:  Facility Medical sales representative, Family Supports Permission granted to share information::  Yes, Verbal Permission Granted  Name::     Education officer, community::  [facility]  Relationship::  son  Contact Information:     Housing/Transportation Living arrangements for the past 2 months:    Source of Information:    Patient Interpreter Needed:  None Criminal Activity/Legal Involvement Pertinent to Current Situation/Hospitalization:  No - Comment as needed Significant Relationships:  Adult Children Lives with:  Adult Children Do you feel safe going back to the place where you live?  No Need for family participation in patient care:  Yes (Comment)  Care giving concerns: Pt lives at home with son who is primary caregiver- pt is requiring high level of needs that pt son has progressively been unable to handle his current needs   Office manager / plan:  CSW spoke with pt son about palliative recommendation for hospice  Employment status:  Retired Health and safety inspector:    PT Recommendations:  Home with Home Health, 24 Hour Supervision Information / Referral to community resources:   [facility])  Patient/Family's Response to care:  Patient son is agreeable to [facility] for comfort care  Patient/Family's Understanding of and Emotional Response to Diagnosis, Current Treatment, and Prognosis:  Pt son is somewhat overwhelmed by pt prognosis and states that he has been taking care of the pt for 10 years and hates knowing he'll never be home again- pt son is realistic about current prognosis and just wants the pt to be comfortable  Emotional Assessment Appearance:    Attitude/Demeanor/Rapport:  Unable to  Assess Affect (typically observed):  Unable to Assess Orientation:  Fluctuating Orientation (Suspected and/or reported Sundowners) Alcohol / Substance use:  Tobacco Use Psych involvement (Current and /or in the community):  No (Comment)  Discharge Needs  Concerns to be addressed:  Care Coordination Readmission within the last 30 days:  No Current discharge risk:  Terminally ill Barriers to Discharge:  Continued Medical Work up   [employer], LCSW 08/06/2015, 3:24 PM

## 2015-08-06 NOTE — Care Management Important Message (Signed)
Important Message  Patient Details  Name: [name] MRN: [id number] Date of Birth: 29-Sep-1946   Medicare Important Message Given:  Yes-second notification given    Kyla Balzarine 08/06/2015, 11:22 AM
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
## 2015-08-06 NOTE — Progress Notes (Addendum)
3:15pm [facility] anticipates being able to accept patient tomorrow (10/11)- they will follow up with patient son to complete paperwork  DNR on chart to be signed.  2:30pm CSW received consult for residential hospice placement- CSW made referral to [facility]- they are reviewing- if pt is appropriate they anticipate having beds available on 10/11  CSW will continue to follow.  Merlyn Lot, LCSWA Clinical Social Worker [phone]

## 2015-08-07 LAB — BASIC METABOLIC PANEL
Anion gap: 7 (ref 5–15)
BUN: 28 mg/dL — AB (ref 6–20)
CALCIUM: 7.9 mg/dL — AB (ref 8.9–10.3)
CO2: 25 mmol/L (ref 22–32)
Chloride: 109 mmol/L (ref 101–111)
Creatinine, Ser: 0.97 mg/dL (ref 0.61–1.24)
GFR calc Af Amer: >60 mL/min (ref >60)
GLUCOSE: 192 mg/dL — AB (ref 65–99)
POTASSIUM: 4 mmol/L (ref 3.5–5.1)
Sodium: 141 mmol/L (ref 135–145)

## 2015-08-07 LAB — CBC
HEMATOCRIT: 23.2 % — AB (ref 39.0–52.0)
Hemoglobin: 7.7 g/dL — ABNORMAL LOW (ref 13.0–17.0)
MCH: 30.7 pg (ref 26.0–34.0)
MCHC: 33.2 g/dL (ref 30.0–36.0)
MCV: 92.4 fL (ref 78.0–100.0)
Platelets: 214 10*3/uL (ref 150–400)
RBC: 2.51 MIL/uL — ABNORMAL LOW (ref 4.22–5.81)
RDW: 19.7 % — AB (ref 11.5–15.5)
WBC: 23.8 10*3/uL — ABNORMAL HIGH (ref 4.0–10.5)

## 2015-08-07 LAB — GLUCOSE, CAPILLARY
GLUCOSE-CAPILLARY: 241 mg/dL — AB (ref 65–99)
Glucose-Capillary: 156 mg/dL — ABNORMAL HIGH (ref 65–99)
Glucose-Capillary: 280 mg/dL — ABNORMAL HIGH (ref 65–99)

## 2015-08-07 MED ORDER — PANTOPRAZOLE SODIUM 40 MG PO TBEC: 40.0000 mg | DELAYED_RELEASE_TABLET | Freq: Two times a day (BID) | ORAL | Status: AC

## 2015-08-07 MED ORDER — PREDNISONE 50 MG PO TABS: ORAL_TABLET | ORAL | Status: AC

## 2015-08-07 MED ORDER — GLUCERNA SHAKE PO LIQD: 237.0000 mL | Freq: Three times a day (TID) | ORAL | Status: AC

## 2015-08-07 NOTE — Evaluation (Signed)
Clinical/Bedside Swallow Evaluation Patient Details  Name: [name] MRN: [id number] Date of Birth: Apr 14, 1946  Today's Date: 08/07/2015 Time: SLP Start Time (ACUTE ONLY): 1012 SLP Stop Time (ACUTE ONLY): 1022 SLP Time Calculation (min) (ACUTE ONLY): 10 min  Past Medical History:  Past Medical History  Diagnosis Date  . Anemia   . CAD (coronary artery disease)   . History of GI bleed   . PAD (peripheral artery disease) (HCC)   . Atrial fibrillation (HCC)   . Hypothyroidism   . Hyperlipidemia   . Hypertension   . Chronic kidney disease     chronic renal insufficiency  . COPD (chronic obstructive pulmonary disease) (HCC)   . Dehiscence of operative wound   . H/O hiatal hernia   . Type II diabetes mellitus (HCC)   . History of blood transfusion     "some; related to surgeries & LGIB"  . GERD (gastroesophageal reflux disease)   . Depression   . Stroke [facility])     "he's had 6 mini strokes/last CT scan, probably more since; stroke in right eye, can't see out of it since" (08/15/2014)   Past Surgical History:  Past Surgical History  Procedure Laterality Date  . Femoral bypass Right 12/29/2002    w/ Goretex   by Dr. Josephina Gip  . Femoral bypass Left 12/01/2001    Hattie Perch 03/11/2011  . 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 Above knee leg amputation Right 06/05/09  . Leg amputation above knee Right 04/11/2010    Revision of right above knee amputation  . Coronary artery bypass graft  2002    CABG X4; By Tyrone Sage  . Below knee leg amputation Left 06/27/2011    Hattie Perch 06/27/2011  . Incision and drainage of wound Right 01/21/2003    distal thigh and popliteal space/notes 03/11/2011  . Cardiac catheterization  08/2001    Hattie Perch 03/11/2011  . Drainage and closure of lymphocele Left 01/17/2002    Hattie Perch 03/11/2011  . Debridement and closure wound Right 02/01/2003    partial closure of distal thigh & popliteal wound/notes 03/11/2011  . Femoral endarterectomy  Right 07/12/2003    Hattie Perch 03/11/2011  . Femoropopliteal thrombectomy / embolectomy Right 01/29/2005    Hattie Perch 03/11/2011  . Femoral bypass Left 09/09/2005    Hattie Perch 03/11/2011  . Femoropopliteal thrombectomy / embolectomy Right 02/06/2006    Hattie Perch 03/11/2011  . Axillary-femoral bypass graft Right 02/06/2006    Hattie Perch 03/11/2011  . Thrombectomy / embolectomy axillary artery Right 02/08/2006    Hattie Perch 03/11/2011  . Debridement  foot Right 02/12/2006    & amputation right third toe/notes 03/11/2011  . Wound debridement Right 03/30/2006    BTK amputation/notes 03/11/2011  . Debridement and closure wound Right 04/23/2006; 04/26/2006    closure of BTK amputation/notes 03/11/2011  . Transmetatarsal amputation Left 06/24/2011    "#3, 4, & 5/notes 06/26/2011  . Amputation Right 07/27/2015    Procedure: FINGER AMPUTATION, right ring;  Surgeon: Betha Loa, MD;  Location: [facility] OR;  Service: Orthopedics;  Laterality: Right;  . Esophagogastroduodenoscopy (egd) with propofol N/A 07/30/2015    Procedure: ESOPHAGOGASTRODUODENOSCOPY (EGD) WITH PROPOFOL;  Surgeon: Carman Ching, MD;  Location: [facility] ENDOSCOPY;  Service: Endoscopy;  Laterality: N/A;   HPI:  69 y.o. 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 GI bleed.  Pt has been followed by Palliative Medicine, is full comfort care and awaiting D/C to hospice facility in [facility].  Has been eating poorly for several weeks per notes.  Swallow evaluation orders received 08/06/15.   Assessment / Plan / Recommendation Clinical Impression  Pt presents with functional swallow.  There is slowed, deliberate mastication/oral preparation of all consistencies, but consumption appears to be functional with no focal deficits, no s/s of aspiration.  RR consistent with adequate ventilatory/swallow sequence.  Pt sufficiently alert for safe  eating.  Recommend continuing to allow pt to eat/drink per his preferences.  No SLP f/u warranted - will sign off.       Aspiration Risk  Mild    Diet Recommendation Age appropriate regular solids;Thin   Medication Administration: Whole meds with liquid    Other  Recommendations Oral Care Recommendations: Oral care BID       Swallow Study Prior Functional Status       General Date of Onset: 07/24/15 Other Pertinent Information: 69 y.o. 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 GI bleed.  Pt has been followed by Palliative Medicine, is full comfort care and awaiting D/C to hospice facility in [facility].  Has been eating poorly for several weeks per notes.  Swallow evaluation orders received 08/06/15. Type of Study: Bedside swallow evaluation Previous Swallow Assessment: none per records Diet Prior to this Study: Regular;Thin liquids Temperature Spikes Noted: No Respiratory Status: Room air History of Recent Intubation: No Behavior/Cognition: Alert;Cooperative Oral Cavity - Dentition: Adequate natural dentition/normal for age Self-Feeding Abilities: Needs assist Patient Positioning: Upright in bed Baseline Vocal Quality: Normal Volitional Cough: Strong    Oral/Motor/Sensory Function Overall Oral Motor/Sensory Function: Appears within functional limits for tasks assessed   Ice Chips Ice chips: Within functional limits Presentation: Spoon   Thin Liquid Thin Liquid: Within functional limits Presentation: Cup;Straw    Nectar Thick Nectar Thick Liquid: Not tested   Honey Thick Honey Thick Liquid: Not tested   Puree Puree: Within functional limits   Solid   Gaylin Osoria L. [place], [place] CCC/SLP Pager [phone]     Solid: Within functional limits       Blenda Mounts Laurice 08/07/2015,10:29 AM

## 2015-08-07 NOTE — Progress Notes (Signed)
eLink Physician-Brief Progress Note Patient Name: [name] DOB: 1945/10/28 MRN: [id number]   Date of Service  08/07/2015  HPI/Events of Note  Hypotension - BP = 74/33. Patient is DNR. Not a candidate for vasopressors.   eICU Interventions  Will order: 1. Monitor CVP. If CVP < 10, will need isotonic fluid bolus.      Intervention Category Intermediate Interventions: Hypovolemia - evaluation and management  Rolla Kedzierski [place] 08/07/2015, 2:02 AM

## 2015-08-07 NOTE — Progress Notes (Signed)
Nutrition Brief Note  Chart reviewed. Pt now transitioned to comfort care.  No further nutrition interventions warranted at this time.  Please re-consult as needed.   Maureen Chatters, RD, LDN Pager #: [phone] After-Hours Pager #: [phone]

## 2015-08-07 NOTE — Progress Notes (Signed)
[facility] is here to pick up patient to be transferred to [facility].  Delorse Lek, daughter-in-law, is aware of this transfer.

## 2015-08-07 NOTE — Progress Notes (Signed)
Patient is being discharge to [facility].  Report given to Marthann Schiller, RN.  Son was notified by Lovette Cliche, LSW of this discharge.  Patient is also informed that he is being discharge.  Patient verbalized understanding.

## 2015-08-08 NOTE — Progress Notes (Signed)
[facility]  DC packet completed and d/c summary sent to facility. Nursing notified to call report and son aware of d/c and is agreeable. Patient is oriented to person only.  No further CSW  Needs identified.  Lorri Frederickonna T. Jaci LazierCrowder, [place] [phone] (coverage)

## 2015-08-28 DEATH — deceased

## 2016-04-09 IMAGING — US US ABDOMEN LIMITED
1 series · 14 of 25 positions shown · non-contrast
Comparison: CT 08/15/2014.

CLINICAL DATA: GI bleed with melena. Elevated INR. Initial
encounter.

EXAM:
US ABDOMEN LIMITED - RIGHT UPPER QUADRANT

[Series 1: us abdomen limited · 0.17mm/px · 14 of 42 slices shown]
[im 1/42]
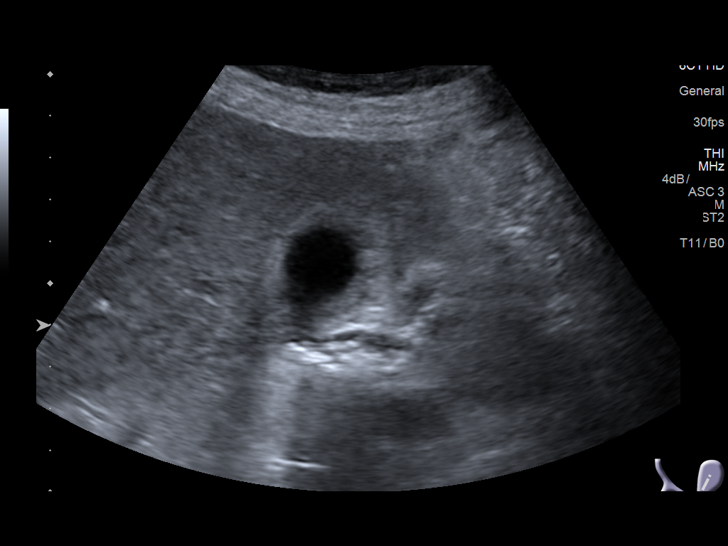
[im 4/42]
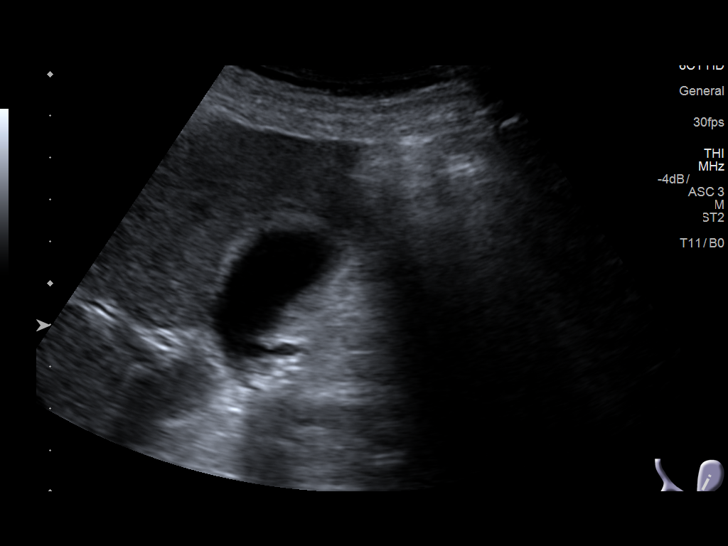
[im 7/42]
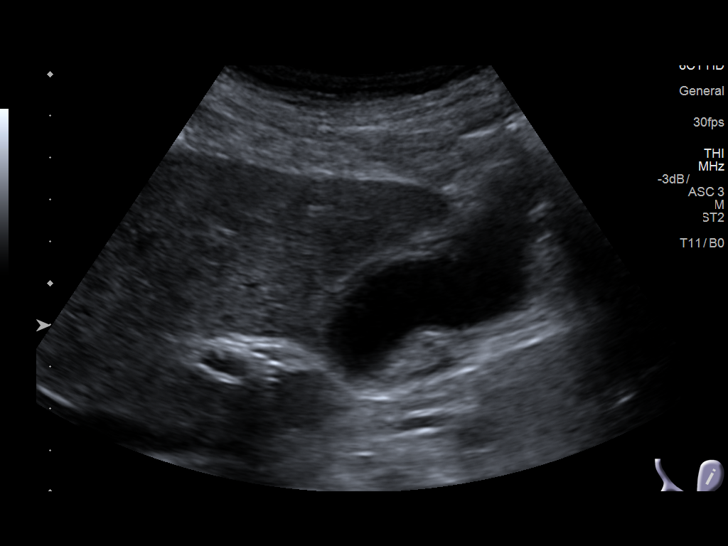
[im 11/42]
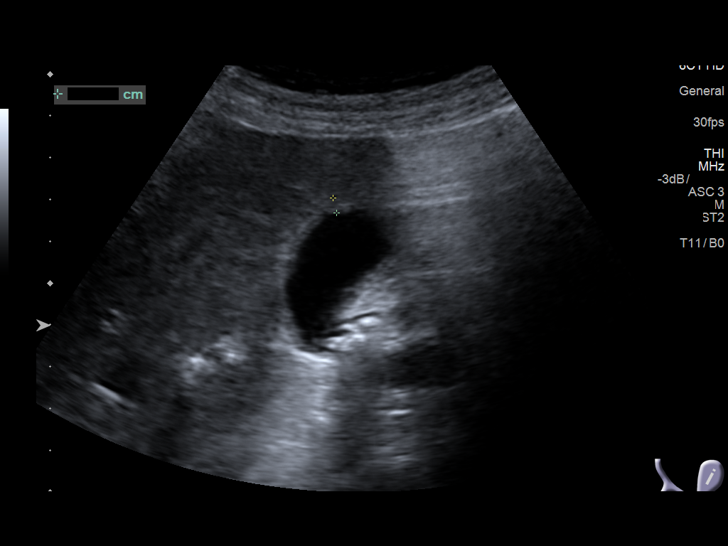
[im 14/42]
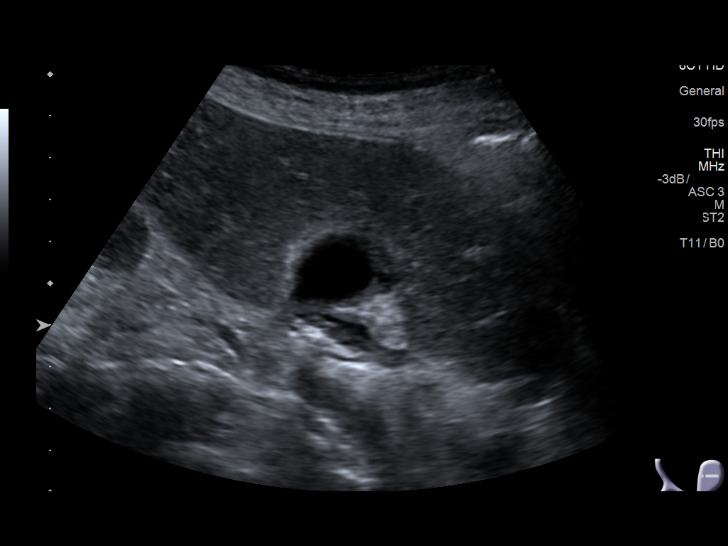
[im 16/42]
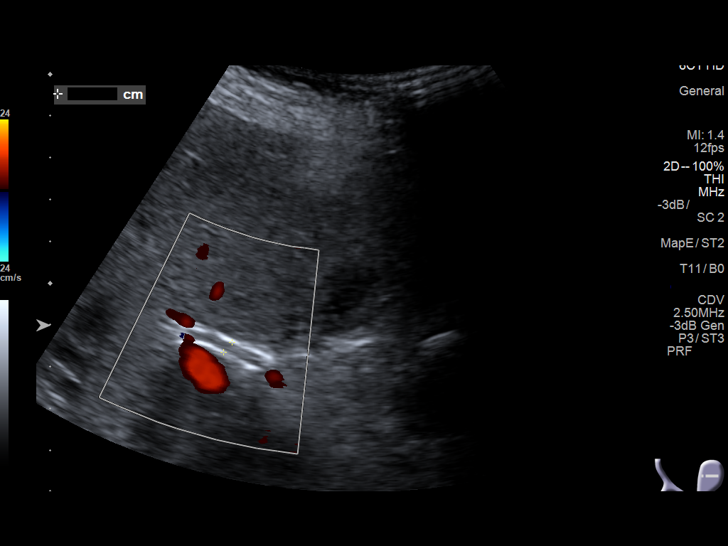
[im 19/42]
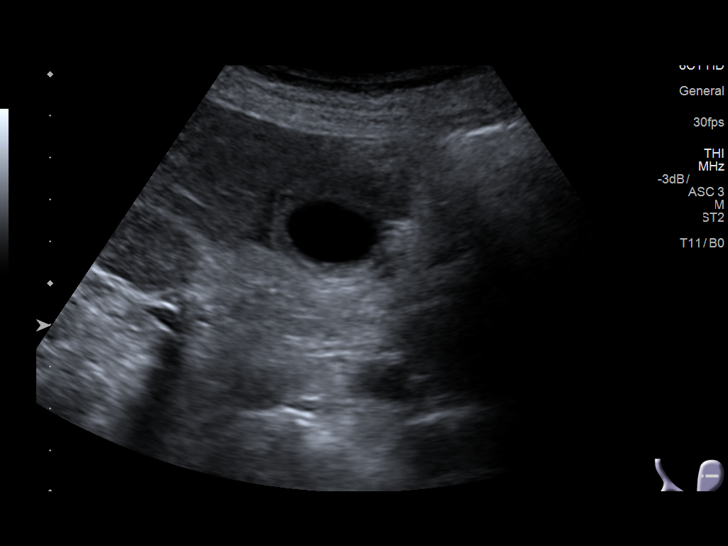
[im 23/42]
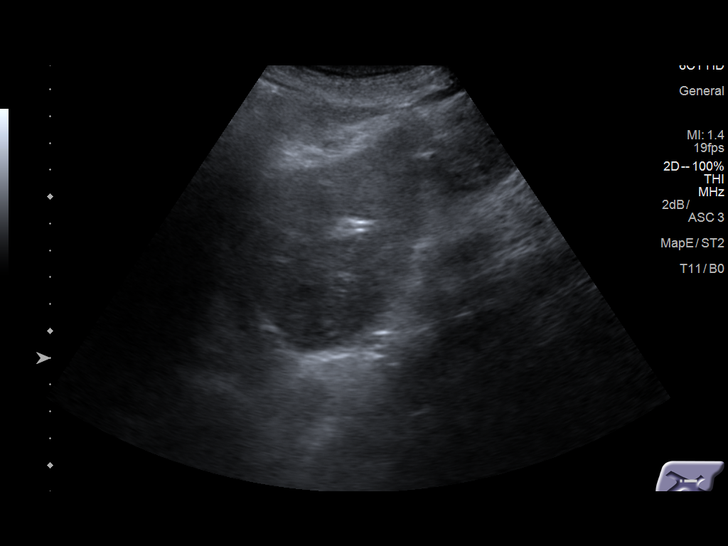
[im 26/42]
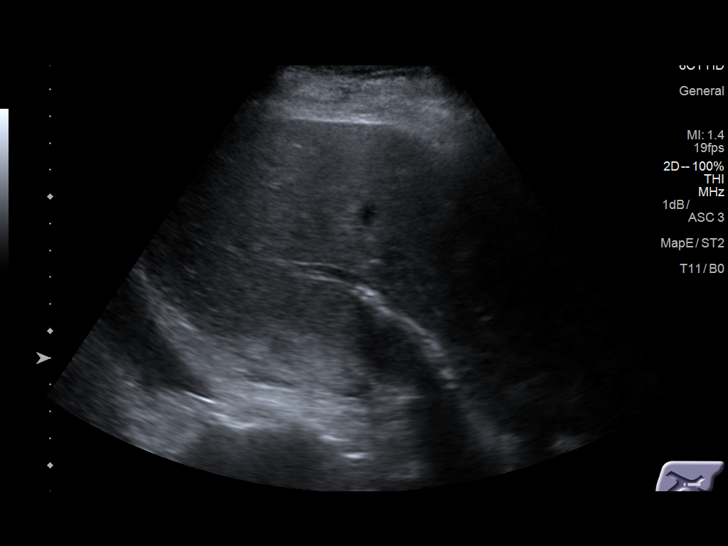
[im 28/42]
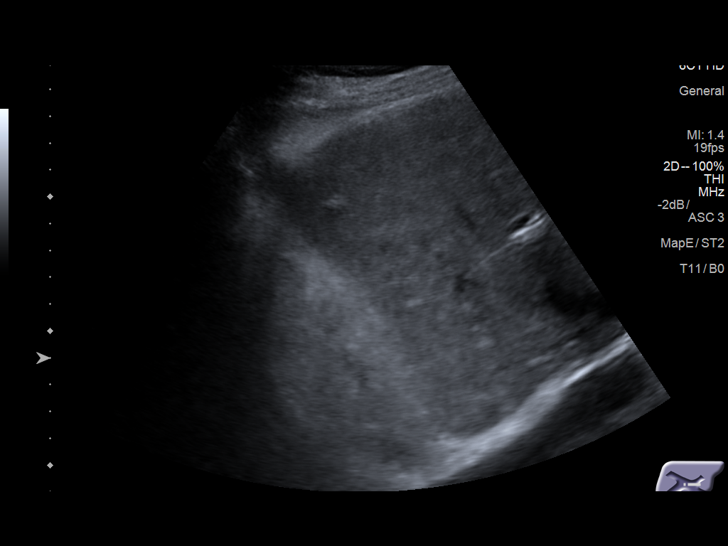
[im 31/42]
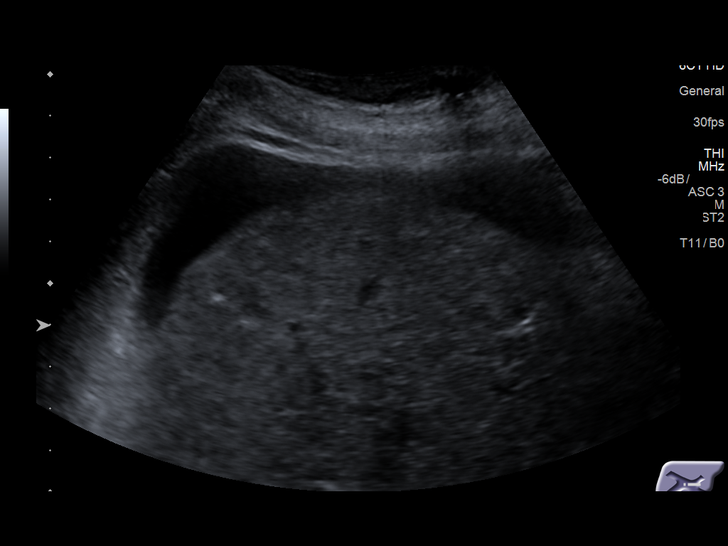
[im 35/42]
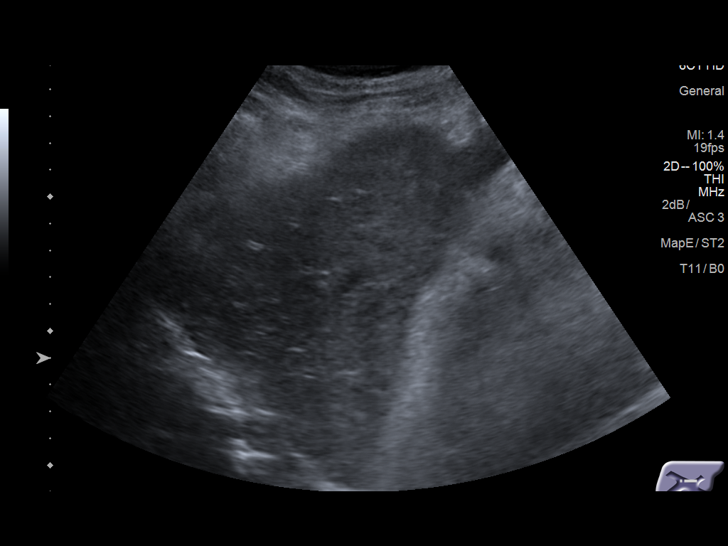
[im 38/42]
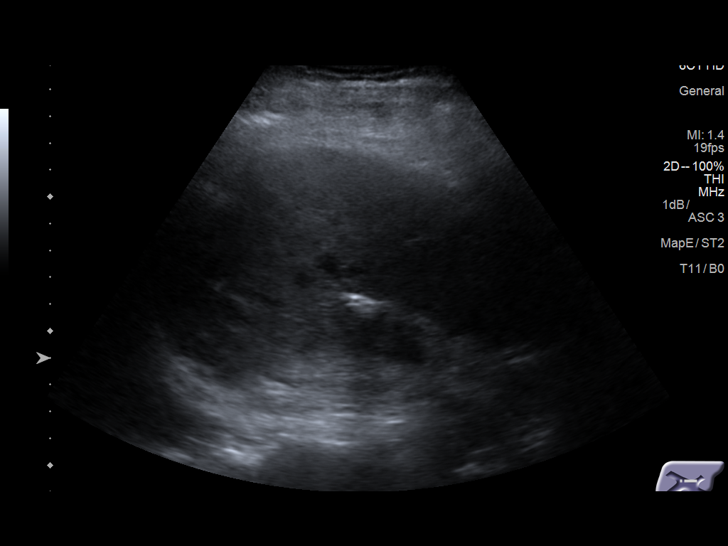
[im 42/42]
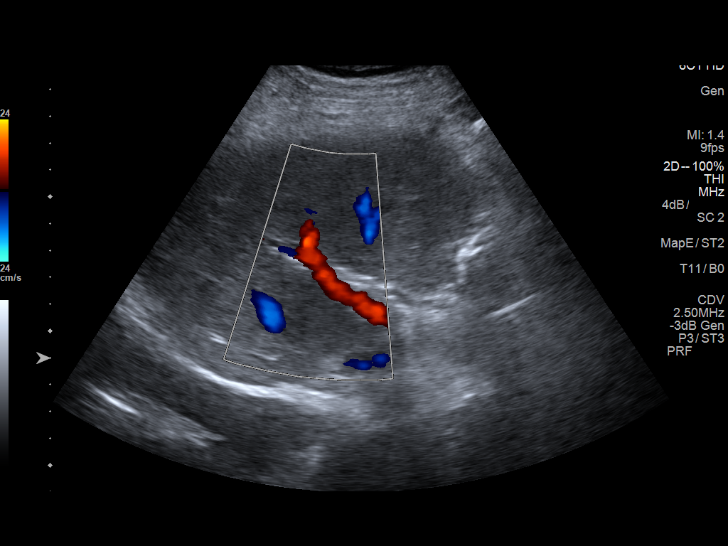

[14 of 25 positions shown; findings below may reference images not displayed]

FINDINGS: Gallbladder:

Several small gallstones are noted as seen on prior CT. There is
mild gallbladder wall thickening but no sonographic Murphy's sign.

Common bile duct:

Diameter: 3.1 mm

Liver:

The liver is heterogeneous in echotexture with mild contour
irregularity, suspicious for cirrhosis. Mild perihepatic ascites is
noted. A small right pleural effusion is also noted.
IMPRESSION: 1. Cholelithiasis with nonspecific mild gallbladder wall thickening,
possibly related to adjacent liver disease. Negative sonographic
Murphy's sign.
2. Heterogeneity of the hepatic echotexture with contour
irregularity suspicious for cirrhosis. Mild ascites.

## 2016-08-26 IMAGING — DX DG CHEST 1V
1 series · 1 of 1 positions shown · non-contrast
Comparison: 08/15/2014

CLINICAL DATA: INFECTION RIGHT RING FINGER.UNABLE TO SIT UP FOR
LATERAL VIEW/DECUB SORE

EXAM:
CHEST 1 VIEW

[chest ap]
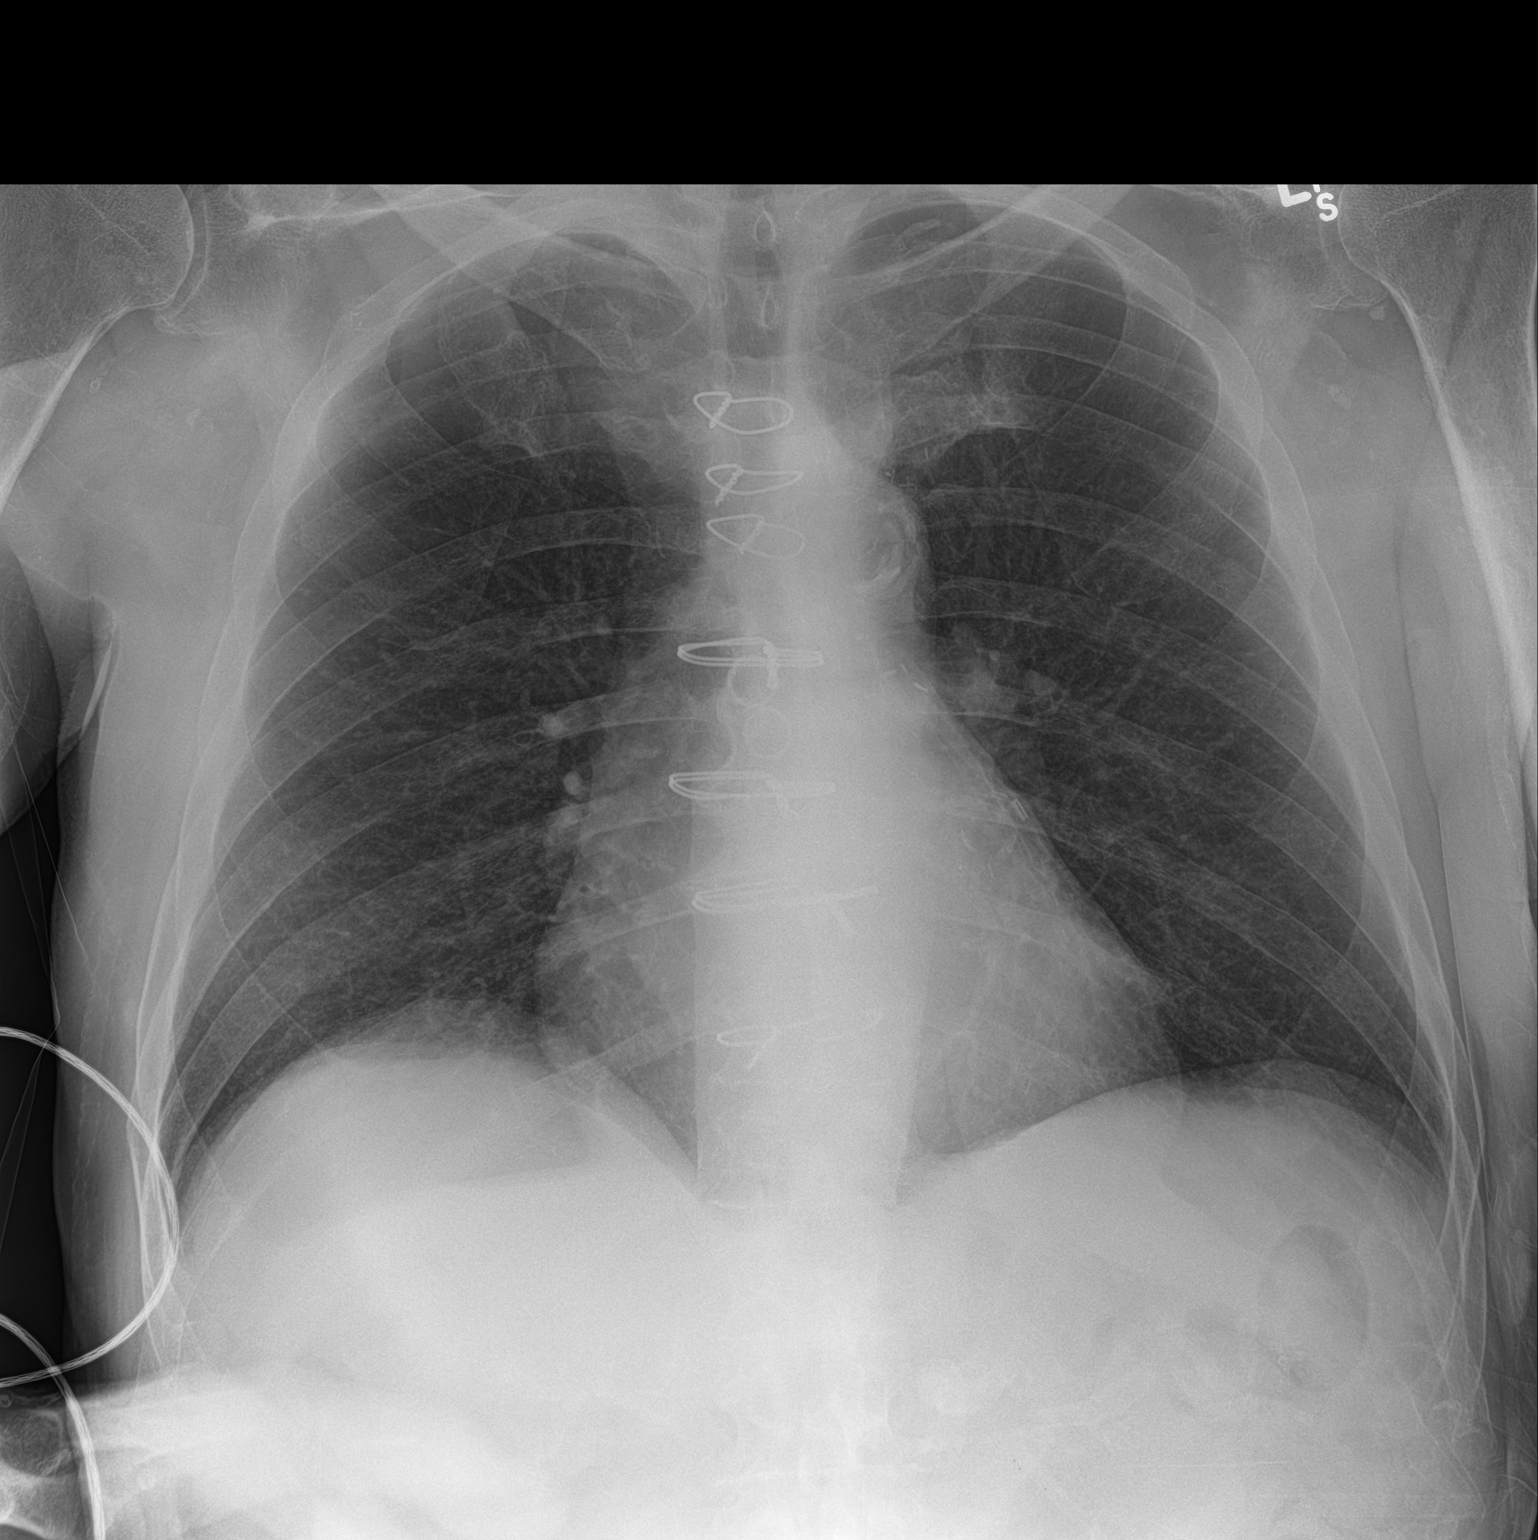

[1 of 1 positions shown; findings below may reference images not displayed]

FINDINGS: Status post median sternotomy and CABG. Heart size is normal. There
are no focal consolidations or pleural effusions. No pulmonary
edema.
IMPRESSION: No evidence for acute cardiopulmonary abnormality.
